# Patient Record
Sex: Female | Born: 1965
Health system: Southern US, Community
[De-identification: ages and names within clinical notes are randomized; demographics above are authoritative.]

## PROBLEM LIST (undated history)

## (undated) DIAGNOSIS — K219 Gastro-esophageal reflux disease without esophagitis: Secondary | ICD-10-CM

## (undated) DIAGNOSIS — Z853 Personal history of malignant neoplasm of breast: Secondary | ICD-10-CM

## (undated) DIAGNOSIS — Z9221 Personal history of antineoplastic chemotherapy: Secondary | ICD-10-CM

## (undated) DIAGNOSIS — Z972 Presence of dental prosthetic device (complete) (partial): Secondary | ICD-10-CM

## (undated) DIAGNOSIS — Z923 Personal history of irradiation: Secondary | ICD-10-CM

## (undated) HISTORY — PX: WISDOM TOOTH EXTRACTION: SHX21

---
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
## 2002-02-16 ENCOUNTER — Encounter: Payer: Self-pay | Admitting: Obstetrics and Gynecology

## 2002-02-16 ENCOUNTER — Ambulatory Visit (HOSPITAL_COMMUNITY): Admission: RE | Admit: 2002-02-16 | Discharge: 2002-02-16 | Payer: Self-pay | Admitting: Obstetrics and Gynecology

## 2003-08-06 ENCOUNTER — Other Ambulatory Visit: Admission: RE | Admit: 2003-08-06 | Discharge: 2003-08-06 | Payer: Self-pay | Admitting: Obstetrics and Gynecology

## 2012-06-03 ENCOUNTER — Ambulatory Visit (INDEPENDENT_AMBULATORY_CARE_PROVIDER_SITE_OTHER): Payer: 59 | Admitting: Obstetrics and Gynecology

## 2012-06-03 ENCOUNTER — Encounter: Payer: Self-pay | Admitting: Obstetrics and Gynecology

## 2012-06-03 VITALS — BP 100/60 | HR 70 | Wt 139.0 lb

## 2012-06-03 DIAGNOSIS — N912 Amenorrhea, unspecified: Secondary | ICD-10-CM

## 2012-06-03 NOTE — Progress Notes (Signed)
  45 YO complains of not having a period. Had same last October-December.  Once she started progesterone she had regular periods until April.  Spotted in April  (had just began Synthroid at the end of February) and no bleeding since.  Was told by Dr. Kevan Ny that she is peri-menopausal.  Admits to urinary frequency but denies dysuria, pelvic pain, vaginitis symptoms, vasomotor symptoms (since last fall) or vaginal dryness.  This past Sunday, had some spotting throughout the day with lower back discomfort but none since.  O: Abdomen: soft, non-tender     Pelvic: EGBUS-wnl, vagina-normal rugae, scant blood, cervix-string visible, uterus-normal size, adnexae-no masses or tenderness   UPT-negative U/A-negative  A: Oligomenorrhea/Perimenopausal     Newly Diagnosed Thyroid Disease  P:  Monitor bleeding       RTO-AEx 10/2011 or prn  Tonya Ambrosino, PA-C

## 2014-08-23 ENCOUNTER — Encounter: Payer: Self-pay | Admitting: Obstetrics and Gynecology

## 2016-10-22 DIAGNOSIS — Z853 Personal history of malignant neoplasm of breast: Secondary | ICD-10-CM

## 2016-10-22 HISTORY — DX: Personal history of malignant neoplasm of breast: Z85.3

## 2017-08-29 ENCOUNTER — Other Ambulatory Visit: Payer: Self-pay | Admitting: Radiology

## 2017-09-02 ENCOUNTER — Encounter: Payer: Self-pay | Admitting: Hematology and Oncology

## 2017-09-02 ENCOUNTER — Telehealth: Payer: Self-pay | Admitting: Hematology and Oncology

## 2017-09-02 ENCOUNTER — Encounter: Payer: Self-pay | Admitting: Genetics

## 2017-09-02 ENCOUNTER — Encounter: Payer: Self-pay | Admitting: *Deleted

## 2017-09-02 NOTE — Telephone Encounter (Signed)
Confirmed afternoon [facility] appointment on 09/04/17 with patient, [facility] patient so no packet sent to patient

## 2017-09-03 ENCOUNTER — Other Ambulatory Visit: Payer: Self-pay | Admitting: *Deleted

## 2017-09-03 DIAGNOSIS — Z170 Estrogen receptor positive status [ER+]: Principal | ICD-10-CM

## 2017-09-03 DIAGNOSIS — C50412 Malignant neoplasm of upper-outer quadrant of left female breast: Secondary | ICD-10-CM | POA: Insufficient documentation

## 2017-09-04 ENCOUNTER — Telehealth: Payer: Self-pay | Admitting: *Deleted

## 2017-09-04 ENCOUNTER — Ambulatory Visit (HOSPITAL_BASED_OUTPATIENT_CLINIC_OR_DEPARTMENT_OTHER): Payer: 59 | Admitting: Hematology and Oncology

## 2017-09-04 ENCOUNTER — Ambulatory Visit
Admission: RE | Admit: 2017-09-04 | Discharge: 2017-09-04 | Disposition: A | Discharge status: Home / Self Care | Payer: 59 | Source: Ambulatory Visit | Attending: Radiation Oncology | Admitting: Radiation Oncology

## 2017-09-04 ENCOUNTER — Other Ambulatory Visit: Payer: Self-pay | Admitting: *Deleted

## 2017-09-04 ENCOUNTER — Encounter: Payer: Self-pay | Admitting: Physical Therapy

## 2017-09-04 ENCOUNTER — Other Ambulatory Visit (HOSPITAL_BASED_OUTPATIENT_CLINIC_OR_DEPARTMENT_OTHER): Payer: 59

## 2017-09-04 ENCOUNTER — Encounter: Payer: Self-pay | Admitting: Hematology and Oncology

## 2017-09-04 ENCOUNTER — Ambulatory Visit: Payer: 59 | Attending: General Surgery | Admitting: Physical Therapy

## 2017-09-04 DIAGNOSIS — C50412 Malignant neoplasm of upper-outer quadrant of left female breast: Secondary | ICD-10-CM

## 2017-09-04 DIAGNOSIS — C773 Secondary and unspecified malignant neoplasm of axilla and upper limb lymph nodes: Secondary | ICD-10-CM

## 2017-09-04 DIAGNOSIS — Z170 Estrogen receptor positive status [ER+]: Principal | ICD-10-CM

## 2017-09-04 DIAGNOSIS — R293 Abnormal posture: Secondary | ICD-10-CM

## 2017-09-04 DIAGNOSIS — C50212 Malignant neoplasm of upper-inner quadrant of left female breast: Secondary | ICD-10-CM

## 2017-09-04 LAB — CBC WITH DIFFERENTIAL/PLATELET
BASO%: 0.5 % (ref 0.0–2.0)
BASOS ABS: 0 10*3/uL (ref 0.0–0.1)
EOS%: 0.4 % (ref 0.0–7.0)
Eosinophils Absolute: 0 10*3/uL (ref 0.0–0.5)
HEMATOCRIT: 40.4 % (ref 34.8–46.6)
HGB: 13.5 g/dL (ref 11.6–15.9)
LYMPH#: 1.3 10*3/uL (ref 0.9–3.3)
LYMPH%: 25.1 % (ref 14.0–49.7)
MCH: 29.3 pg (ref 25.1–34.0)
MCHC: 33.4 g/dL (ref 31.5–36.0)
MCV: 87.8 fL (ref 79.5–101.0)
MONO#: 0.3 10*3/uL (ref 0.1–0.9)
MONO%: 6.3 % (ref 0.0–14.0)
NEUT#: 3.5 10*3/uL (ref 1.5–6.5)
NEUT%: 67.7 % (ref 38.4–76.8)
PLATELETS: 205 10*3/uL (ref 145–400)
RBC: 4.61 10*6/uL (ref 3.70–5.45)
RDW: 13.9 % (ref 11.2–14.5)
WBC: 5.2 10*3/uL (ref 3.9–10.3)

## 2017-09-04 LAB — COMPREHENSIVE METABOLIC PANEL
ALBUMIN: 4.4 g/dL (ref 3.5–5.0)
ALK PHOS: 61 U/L (ref 40–150)
ALT: 11 U/L (ref 0–55)
ANION GAP: 9 meq/L (ref 3–11)
AST: 16 U/L (ref 5–34)
BUN: 10.4 mg/dL (ref 7.0–26.0)
CALCIUM: 9.5 mg/dL (ref 8.4–10.4)
CHLORIDE: 104 meq/L (ref 98–109)
CO2: 28 mEq/L (ref 22–29)
CREATININE: 0.8 mg/dL (ref 0.6–1.1)
EGFR: >60 mL/min/{1.73_m2} (ref >60)
Glucose: 89 mg/dl (ref 70–140)
POTASSIUM: 3.7 meq/L (ref 3.5–5.1)
Sodium: 141 mEq/L (ref 136–145)
Total Bilirubin: 0.71 mg/dL (ref 0.20–1.20)
Total Protein: 7.8 g/dL (ref 6.4–8.3)

## 2017-09-04 NOTE — Assessment & Plan Note (Signed)
08/29/2017: Screening detected left breast mass 4 cm at 12 o'clock position, 1.4 cm lymph node the axilla, biopsy of the mass and the lymph nodes were positive for IDC with DCIS grade 3 ER 70% PR 0%, KI 2%, HER-2 negative ratio 1.77, T2N1 stage IIIa clinical stage AJCC 8  Pathology and radiology counseling: Discussed with the patient, the details of pathology including the type of breast cancer,the clinical staging, the significance of ER, PR and HER-2/neu receptors and the implications for treatment. After reviewing the pathology in detail, we proceeded to discuss the different treatment options between surgery, radiation, chemotherapy, antiestrogen therapies.  Recommendation: 1.  Mammaprint testing on the biopsy to determine which neoadjuvant chemotherapy would be better 2. neoadjuvant chemotherapy with dose dense Adriamycin and Cytoxan followed by Taxol x12 versus 6 months of antiestrogen therapy 3.  Followed by breast conserving surgery and targeted lymph node dissection 4.  Followed by radiation 5.  Follow-up antiestrogen therapy  Breast MRI will be obtained along with CT chest abdomen pelvis and bone scan. Patient will return back to see me after the Mammaprint and the CT scans were performed.

## 2017-09-04 NOTE — Progress Notes (Signed)
Radiation Oncology         [phone] ________________________________  Initial Outpatient Consultation  Name: Tonya Jackson MRN: [id number]  Date: 09/04/2017  DOB: [birth year]  YO:VZCHY, Butch Penny, MD  Jovita Kussmaul, MD   REFERRING PHYSICIAN: Autumn Messing III, MD  DIAGNOSIS:    ICD-10-CM   1. Malignant neoplasm of upper-outer quadrant of left breast in female, estrogen receptor positive ([place]) C50.412    Z17.0   Cancer Staging Malignant neoplasm of upper-outer quadrant of left breast in female, estrogen receptor positive ([place]) Staging form: Breast, AJCC 8th Edition - Clinical stage from 09/04/2017: Stage IIIA (cT2, cN1, cM0, G3, ER: Positive, PR: Negative, HER2: Negative) - Unsigned   Stage IIIA cT2N1M0 Left Breast UOQ Invasive Ductal Carcinoma, ER(+) / PR(-) / Her2(-), Grade 3  CHIEF COMPLAINT: Here to discuss management of left breast cancer  HISTORY OF PRESENT ILLNESS::Syndi I Jackson is a 51 y.o. female who presented with a left breast mass on screening mammography. On ultrasound this measured 4.0 cm at the 12:00 position. There was a 1.4 cm lymph node also appreciated on ultrasound in the axilla. Biopsy showed invasive ductal carcinoma with DCIS, ER 70%, PR negative, Her2 negative, grade 3 (Pathology is going to review the grade since Ki-67 was 2%). The lymph node was biopsied and found to be positive.  The patient presents to our [facility] today for evaluation and discussion of potential treatment options. On review of systems, she is positive for breast lump and easy bruising.  GYN History: The patient had her first menstrual period at age 25. She is postmenopausal.  Obstretric History: She has carried two children to term, with the first live birth at age 72.  PREVIOUS RADIATION THERAPY: No  PAST MEDICAL HISTORY:  has a past medical history of Abnormal Pap smear (1998), Anemia (2003), Dysuria (2011), H/O cyst of breast (2010), H/O diarrhea  (2009), H/O dysmenorrhea (2003), H/O hematuria (2011), H/O varicella, H/O vulvovaginitis (2010), H/O: menorrhagia (2003), History of measles, mumps, or rubella, UTI (urinary tract infection) (2008), Menses, irregular (2003), Monilial vaginitis (2003), Oligomenorrhea (11/05/2011), Perimenopausal symptoms (11/05/2011), Vulvar inflammation (2003), and Yeast infection.    PAST SURGICAL HISTORY: Past Surgical History:  Procedure Laterality Date  . APPENDECTOMY  1982   An open   . WISDOM TOOTH EXTRACTION      FAMILY HISTORY: family history includes Diabetes in her father; Hypertension in her father and other.  SOCIAL HISTORY:  reports that  has never smoked. she has never used smokeless tobacco. She reports that she does not drink alcohol or use drugs.  ALLERGIES: Patient has no known allergies.  MEDICATIONS:  Current Outpatient Medications  Medication Sig Dispense Refill  . levothyroxine (SYNTHROID, LEVOTHROID) 50 MCG tablet Take 50 mcg by mouth daily.    Marland Kitchen PROGESTERONE MICRONIZED PO Take 200 mg/day by mouth.     No current facility-administered medications for this encounter.     REVIEW OF SYSTEMS: A 10+ POINT REVIEW OF SYSTEMS WAS OBTAINED including neurology, dermatology, psychiatry, cardiac, respiratory, lymph, extremities, GI, GU, Musculoskeletal, constitutional, breasts, reproductive, HEENT.  All pertinent positives are noted in the HPI.  All others are negative.   PHYSICAL EXAM:  Vitals with BMI 09/04/2017  Height '5\' 6"'$   Weight 139 lbs 13 oz  BMI 85.02  Systolic 774  Diastolic 82  Pulse 78  Respirations 20   General: Alert and oriented, in no acute distress HEENT: Head is normocephalic. Extraocular movements are intact. Oropharynx is clear. Neck: Neck  is supple, no palpable cervical or supraclavicular lymphadenopathy. Heart: Regular in rate and rhythm with no murmurs, rubs, or gallops. Chest: Clear to auscultation bilaterally, with no rhonchi, wheezes, or rales. Abdomen:  Soft, nontender, nondistended, with no rigidity or guarding. Extremities: No cyanosis or edema. Lymphatics: see Neck Exam Skin: No concerning lesions. Musculoskeletal: symmetric strength and muscle tone throughout. Neurologic: Cranial nerves II through XII are grossly intact. No obvious focalities. Speech is fluent. Coordination is intact. Psychiatric: Judgment and insight are intact. Affect is appropriate. Breasts: Mobile area of thickening in the 12:00 region of the right breast which was about 2 cm, this could be benign but will be worked up further on her breast MRI. Left breast bruising with post-biopsy mass at 1:00 measuring 5 cm. No palpable axillary nodes bilaterally.   ECOG = 0  0 - Asymptomatic (Fully active, able to carry on all predisease activities without restriction)  1 - Symptomatic but completely ambulatory (Restricted in physically strenuous activity but ambulatory and able to carry out work of a light or sedentary nature. For example, light housework, office work)  2 - Symptomatic, <50% in bed during the day (Ambulatory and capable of all self care but unable to carry out any work activities. Up and about more than 50% of waking hours)  3 - Symptomatic, >50% in bed, but not bedbound (Capable of only limited self-care, confined to bed or chair 50% or more of waking hours)  4 - Bedbound (Completely disabled. Cannot carry on any self-care. Totally confined to bed or chair)  5 - Death   Eustace Pen MM, Creech RH, Tormey DC, et al. [phone]). "Toxicity and response criteria of the [facility]". [place] Oncol. 5 (6): 649-55   LABORATORY DATA:  Lab Results  Component Value Date   WBC 5.2 09/04/2017   HGB 13.5 09/04/2017   HCT 40.4 09/04/2017   MCV 87.8 09/04/2017   PLT 205 09/04/2017   CMP     Component Value Date/Time   NA 141 09/04/2017 1207   K 3.7 09/04/2017 1207   CO2 28 09/04/2017 1207   GLUCOSE 89 09/04/2017 1207   BUN 10.4 09/04/2017  1207   CREATININE 0.8 09/04/2017 1207   CALCIUM 9.5 09/04/2017 1207   PROT 7.8 09/04/2017 1207   ALBUMIN 4.4 09/04/2017 1207   AST 16 09/04/2017 1207   ALT 11 09/04/2017 1207   ALKPHOS 61 09/04/2017 1207   BILITOT 0.71 09/04/2017 1207         RADIOGRAPHY: as above    IMPRESSION/PLAN: Left Breast Cancer   The patient will undergo MRI of the bilateral breasts (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 Post-operatively, she will ultimately need radiotherapy to the left breast and regional nodes.  It was a pleasure meeting the patient today. We discussed the risks, benefits, and side effects of radiotherapy. I recommend radiotherapy to the left breast and regional nodes to reduce her risk of locoregional recurrence by 2/3.  We discussed that radiation would take approximately 6 weeks to complete and that I would give the patient a few weeks to heal following surgery before starting treatment planning.  Chemotherapy will possibly be given, this would precede radiotherapy. We spoke about acute effects including skin irritation and fatigue as well as much less common late effects including internal organ injury or irritation. We spoke about the latest technology that is used to minimize the  risk of late effects for patients undergoing radiotherapy to the breast or chest wall. No guarantees of treatment were given. The patient is enthusiastic about proceeding with treatment. I look forward to participating in the patient's care.  I will await her referral back to me for postoperative follow-up and eventual CT simulation/treatment planning.    __________________________________________   Eppie Gibson, MD  This document serves as a record of services personally performed by Eppie Gibson, MD. It was created on her behalf by Rae Lips, a trained medical scribe. The creation of this record  is based on the scribe's personal observations and the provider's statements to them. This document has been checked and approved by the attending provider.

## 2017-09-04 NOTE — Therapy (Signed)
[place], [place], [address] Phone: [phone]   Fax:  [phone]  Physical Therapy Evaluation  Patient Details  Name: Tonya Jackson MRN: [id number] Date of Birth: 1966-08-07 Referring Provider: Dr. Autumn Messing   Encounter Date: 09/04/2017  PT End of Session - 09/04/17 1949    Visit Number  1    Number of Visits  1    PT Start Time  9935    PT Stop Time  1411    PT Time Calculation (min)  23 min    Activity Tolerance  Patient tolerated treatment well    Behavior During Therapy  [facility] for tasks assessed/performed       Past Medical History:  Diagnosis Date  . Abnormal Pap smear 1998  . Anemia 2003  . Dysuria 2011  . H/O cyst of breast 2010   Right  . H/O diarrhea 2009  . H/O dysmenorrhea 2003  . H/O hematuria 2011  . H/O varicella   . H/O vulvovaginitis 2010  . H/O: menorrhagia 2003  . History of measles, mumps, or rubella   . Hx: UTI (urinary tract infection) 2008  . Menses, irregular 2003  . Monilial vaginitis 2003  . Oligomenorrhea 11/05/2011  . Perimenopausal symptoms 11/05/2011  . Vulvar inflammation 2003  . Yeast infection     Past Surgical History:  Procedure Laterality Date  . APPENDECTOMY  1982   An open   . WISDOM TOOTH EXTRACTION      There were no vitals filed for this visit.   Subjective Assessment - 09/04/17 1942    Subjective  Patient reports she is here today to meet with her medical team for her newly diagnosed left breast cancer.    Patient is accompained by:  Family member    Pertinent History  Patient was diagnosed on 08/23/17 with left grade 3 invasive ductal carcinoma breast cancer with DCIS. It measures 4 cm and is located in the upper outer quadrant. There is also a positive axillary lymph node. It is ER positive, PR negative, and HER2 negative with a Ki67 of 2%.    Patient Stated Goals  Reduce lymphedema risk and learn post op shoulder ROM HEP    Currently in Pain?  No/denies         [facility] PT Assessment - 09/04/17 0001      Assessment   Medical Diagnosis  Left breast cancer    Referring Provider  Dr. Autumn Messing    Onset Date/Surgical Date  08/23/17    Hand Dominance  Right    Prior Therapy  none      Precautions   Precautions  Other (comment)    Precaution Comments  active cancer      Restrictions   Weight Bearing Restrictions  No      Balance Screen   Has the patient fallen in the past 6 months  No    Has the patient had a decrease in activity level because of a fear of falling?   No    Is the patient reluctant to leave their home because of a fear of falling?   No      Home Environment   Living Environment  Private residence    Living Arrangements  Children;Spouse/significant other Husband and 71 y.o. son    Available Help at Discharge  Family      Prior Function   Level of [place]  Full time  employment    Vocation Requirements  Does alterations at [employer]  She does not exercise      Cognition   Overall Cognitive Status  Within Functional Limits for tasks assessed      Posture/Postural Control   Posture/Postural Control  Postural limitations    Postural Limitations  Rounded Shoulders;Forward head      ROM / Strength   AROM / PROM / Strength  AROM;Strength      AROM   AROM Assessment Site  Shoulder;Cervical    Right/Left Shoulder  Right;Left    Right Shoulder Extension  57 Degrees    Right Shoulder Flexion  145 Degrees    Right Shoulder ABduction  160 Degrees    Right Shoulder Internal Rotation  69 Degrees    Right Shoulder External Rotation  77 Degrees    Left Shoulder Extension  55 Degrees    Left Shoulder Flexion  138 Degrees    Left Shoulder ABduction  152 Degrees    Left Shoulder Internal Rotation  79 Degrees    Left Shoulder External Rotation  79 Degrees    Cervical Flexion  WNL    Cervical Extension  WNL    Cervical - Right Side Bend  WNL    Cervical  - Left Side Bend  WNL    Cervical - Right Rotation  WNL    Cervical - Left Rotation  WNL      Strength   Overall Strength  Within functional limits for tasks performed        LYMPHEDEMA/ONCOLOGY QUESTIONNAIRE - 09/04/17 1948      Type   Cancer Type  Left breast cancer      Lymphedema Assessments   Lymphedema Assessments  Upper extremities      Right Upper Extremity Lymphedema   10 cm Proximal to Olecranon Process  25.8 cm    Olecranon Process  24.5 cm    10 cm Proximal to Ulnar Styloid Process  22.6 cm    Just Proximal to Ulnar Styloid Process  16.1 cm    Across Hand at [employer]  19.7 cm    At [place] of 2nd Digit  6.4 cm      Left Upper Extremity Lymphedema   10 cm Proximal to Olecranon Process  25 cm    Olecranon Process  24.3 cm    10 cm Proximal to Ulnar Styloid Process  21.9 cm    Just Proximal to Ulnar Styloid Process  16.3 cm    Across Hand at [employer]  19.1 cm    At [place] of 2nd Digit  6.5 cm          Objective measurements completed on examination: See above findings.     Patient was instructed today in a home exercise program today for post op shoulder range of motion. These included active assist shoulder flexion in sitting, scapular retraction, wall walking with shoulder abduction, and hands behind head external rotation.  She was encouraged to do these twice a day, holding 3 seconds and repeating 5 times when permitted by her physician.     PT Education - 09/04/17 1949    Education provided  Yes    Education Details  Lymphedema risk reduction and post op shoulder ROM HEP    Person(s) Educated  Patient;Spouse    Methods  Explanation;Demonstration;Handout    Comprehension  Returned demonstration;Verbalized understanding           Breast Clinic Goals -  09/04/17 1954      Patient will be able to verbalize understanding of pertinent lymphedema risk reduction practices relevant to her diagnosis specifically related to skin care.   Time   1    Period  Days    Status  Achieved      Patient will be able to return demonstrate and/or verbalize understanding of the post-op home exercise program related to regaining shoulder range of motion.   Time  1    Period  Days    Status  Achieved      Patient will be able to verbalize understanding of the importance of attending the postoperative After Breast Cancer Class for further lymphedema risk reduction education and therapeutic exercise.   Time  1    Period  Days    Status  Achieved            Plan - 09/04/17 1950    Clinical Impression Statement  Patient was diagnosed on 08/23/17 with left grade 3 invasive ductal carcinoma breast cancer with DCIS. It measures 4 cm and is located in the upper outer quadrant. There is also a positive axillary lymph node. It is ER positive, PR negative, and HER2 negative with a Ki67 of 2%. Her multidisciplinary medicla team met prior to her assessments to determine a recommended treatment plan. She is planning to have a mammaprint test to if she will need neoadjuvant chemotherapy or hormone therapy. She will then have a left lumpectomy and targeted axillary node dissection followed by radiation and anti-estrogen therapy. She will benefit from a post op PT visit to determine PT needs.    History and Personal Factors relevant to plan of care:  None    Clinical Presentation  Stable    Clinical Decision Making  Low    Rehab Potential  Excellent    Clinical Impairments Affecting Rehab Potential  None    PT Frequency  -- Eval and 1 f/u visit     PT Treatment/Interventions  ADLs/Self Care Home Management;Therapeutic exercise;Patient/family education    PT Next Visit Plan  Will reassess pt 3-4 weeks post op to determine PT needs    PT Home Exercise Plan  Post op shoulder ROM HEP    Consulted and Agree with Plan of Care  Patient;Family member/caregiver    Family Member Consulted  Husband       Patient will benefit from skilled therapeutic intervention  in order to improve the following deficits and impairments:  Impaired UE functional use, Decreased knowledge of precautions, Decreased range of motion, Postural dysfunction, Pain  Visit Diagnosis: Malignant neoplasm of upper-outer quadrant of left breast in female, estrogen receptor positive ([place]) - Plan: PT plan of care cert/re-cert  Abnormal posture - Plan: PT plan of care cert/re-cert   Patient will follow up at outpatient cancer rehab 3-4 weeks following surgery.  If the patient requires physical therapy at that time, a specific plan will be dictated and sent to the referring physician for approval. The patient was educated today on appropriate basic range of motion exercises to begin post operatively and the importance of attending the After Breast Cancer class following surgery.  Patient was educated today on lymphedema risk reduction practices as it pertains to recommendations that will benefit the patient immediately following surgery.  She verbalized good understanding.      Problem List Patient Active Problem List   Diagnosis Date Noted  . Malignant neoplasm of upper-outer quadrant of left breast in female, estrogen receptor positive ([place])  09/03/2017   Annia Friendly, PT 09/04/17 7:58 PM  [place], [place], [address] Phone: [phone]   Fax:  [phone]  Name: Tonya Jackson MRN: [id number] Date of Birth: [birth year]

## 2017-09-04 NOTE — Telephone Encounter (Signed)
Ordered mammaprint on core bx per Dr. Lindi Adie.  Faxed requisition to [facility] and to [facility].

## 2017-09-04 NOTE — Progress Notes (Signed)
[place] NOTE  Patient Care Team: [name], MD as PCP - General (Family Medicine)  CHIEF COMPLAINTS/PURPOSE OF CONSULTATION:  Newly diagnosed breast cancer  HISTORY OF PRESENTING ILLNESS:  Tonya Jackson 51 y.o. female is here because of recent diagnosis of left breast cancer.  Patient had a screening mammogram that detected a left breast mass.  Few weeks before then patient had felt a lump in the left breast.  It was 4 cm in size at 12 o'clock position.  There was also 1.4 cm left axillary lymph node that was seen on the ultrasound and a biopsy of that was performed as well.  Breast biopsy revealed invasive ductal carcinoma with DCIS that was grade 3 ER 70% PR 0% Ki-67 2% and HER-2 negative.  She was presented this morning of the multidisciplinary tumor board and she is here today in the [facility] clinic to discuss her treatment plan.  I reviewed her records extensively and collaborated the history with the patient.  SUMMARY OF ONCOLOGIC HISTORY:   Malignant neoplasm of upper-outer quadrant of left breast in female, estrogen receptor positive ([place])   08/29/2017 Initial Diagnosis    Screening detected left breast mass 4 cm at 12 o'clock position, 1.4 cm lymph node the axilla, biopsy of the mass and the lymph nodes were positive for IDC with DCIS grade 3 ER 70% PR 0%, KI 2%, HER-2 negative ratio 1.77, T2N1 stage IIIa clinical stage AJCC 8       MEDICAL HISTORY:  Past Medical History:  Diagnosis Date  . Abnormal Pap smear 1998  . Anemia 2003  . Dysuria 2011  . H/O cyst of breast 2010   Right  . H/O diarrhea 2009  . H/O dysmenorrhea 2003  . H/O hematuria 2011  . H/O varicella   . H/O vulvovaginitis 2010  . H/O: menorrhagia 2003  . History of measles, mumps, or rubella   . Hx: UTI (urinary tract infection) 2008  . Menses, irregular 2003  . Monilial vaginitis 2003  . Oligomenorrhea 11/05/2011  . Perimenopausal symptoms 11/05/2011  . Vulvar inflammation  2003  . Yeast infection     SURGICAL HISTORY: Past Surgical History:  Procedure Laterality Date  . APPENDECTOMY  1982   An open   . WISDOM TOOTH EXTRACTION      SOCIAL HISTORY: Social History   Socioeconomic History  . Marital status: Married    Spouse name: Not on file  . Number of children: Not on file  . Years of education: Not on file  . Highest education level: Not on file  Social Needs  . Financial resource strain: Not on file  . Food insecurity - worry: Not on file  . Food insecurity - inability: Not on file  . Transportation needs - medical: Not on file  . Transportation needs - non-medical: Not on file  Occupational History  . Not on file  Tobacco Use  . Smoking status: Never Smoker  . Smokeless tobacco: Never Used  Substance and Sexual Activity  . Alcohol use: No  . Drug use: No  . Sexual activity: Yes    Birth control/protection: IUD    Comment: PARAGARD  Other Topics Concern  . Not on file  Social History Narrative  . Not on file    FAMILY HISTORY: Family History  Problem Relation Age of Onset  . Diabetes Father   . Hypertension Father   . Hypertension Other     ALLERGIES:  has No Known  Allergies.  MEDICATIONS:  Current Outpatient Medications  Medication Sig Dispense Refill  . levothyroxine (SYNTHROID, LEVOTHROID) 50 MCG tablet Take 50 mcg by mouth daily.    Marland Kitchen PROGESTERONE MICRONIZED PO Take 200 mg/day by mouth.     No current facility-administered medications for this visit.     REVIEW OF SYSTEMS:   Constitutional: Denies fevers, chills or abnormal night sweats Eyes: Denies blurriness of vision, double vision or watery eyes Ears, nose, mouth, throat, and face: Denies mucositis or sore throat Respiratory: Denies cough, dyspnea or wheezes Cardiovascular: Denies palpitation, chest discomfort or lower extremity swelling Gastrointestinal:  Denies nausea, heartburn or change in bowel habits Skin: Denies abnormal skin rashes Lymphatics:  Denies new lymphadenopathy or easy bruising Neurological:Denies numbness, tingling or new weaknesses Behavioral/Psych: Mood is stable, no new changes  Breast: Palpable lump in the left breast All other systems were reviewed with the patient and are negative.  PHYSICAL EXAMINATION: ECOG PERFORMANCE STATUS: 1 - Symptomatic but completely ambulatory  Vitals:   09/04/17 1241  BP: 139/82  Pulse: 78  Resp: 20  Temp: 98.4 F (36.9 C)  SpO2: 99%   Filed Weights   09/04/17 1241  Weight: 139 lb 12.8 oz (63.4 kg)    GENERAL:alert, no distress and comfortable SKIN: skin color, texture, turgor are normal, no rashes or significant lesions EYES: normal, conjunctiva are pink and non-injected, sclera clear OROPHARYNX:no exudate, no erythema and lips, buccal mucosa, and tongue normal  NECK: supple, thyroid normal size, non-tender, without nodularity LYMPH:  no palpable lymphadenopathy in the cervical, axillary or inguinal LUNGS: clear to auscultation and percussion with normal breathing effort HEART: regular rate & rhythm and no murmurs and no lower extremity edema ABDOMEN:abdomen soft, non-tender and normal bowel sounds Musculoskeletal:no cyanosis of digits and no clubbing  PSYCH: alert & oriented x 3 with fluent speech NEURO: no focal motor/sensory deficits BREAST: Palpable lump in the left breast along with palpable axillary lymph node. No palpable axillary or supraclavicular lymphadenopathy (exam performed in the presence of a chaperone)   LABORATORY DATA:  I have reviewed the data as listed Lab Results  Component Value Date   WBC 5.2 09/04/2017   HGB 13.5 09/04/2017   HCT 40.4 09/04/2017   MCV 87.8 09/04/2017   PLT 205 09/04/2017   Lab Results  Component Value Date   NA 141 09/04/2017   K 3.7 09/04/2017   CO2 28 09/04/2017    RADIOGRAPHIC STUDIES: I have personally reviewed the radiological reports and agreed with the findings in the report.  ASSESSMENT AND PLAN:   Malignant neoplasm of upper-outer quadrant of left breast in female, estrogen receptor positive ([place]) 08/29/2017: Screening detected left breast mass 4 cm at 12 o'clock position, 1.4 cm lymph node the axilla, biopsy of the mass and the lymph nodes were positive for IDC with DCIS grade 3 ER 70% PR 0%, KI 2%, HER-2 negative ratio 1.77, T2N1 stage IIIa clinical stage AJCC 8  Pathology and radiology counseling: Discussed with the patient, the details of pathology including the type of breast cancer,the clinical staging, the significance of ER, PR and HER-2/neu receptors and the implications for treatment. After reviewing the pathology in detail, we proceeded to discuss the different treatment options between surgery, radiation, chemotherapy, antiestrogen therapies.  Recommendation: 1.  Mammaprint testing on the biopsy to determine which neoadjuvant chemotherapy would be better 2. neoadjuvant chemotherapy with dose dense Adriamycin and Cytoxan followed by Taxol x12 versus 6 months of antiestrogen therapy 3.  Followed by breast  conserving surgery and targeted lymph node dissection 4.  Followed by radiation 5.  Follow-up antiestrogen therapy  Breast MRI will be obtained along with CT chest abdomen pelvis and bone scan. Patient will return back to see me after the Mammaprint and the CT scans were performed.   All questions were answered. The patient knows to call the clinic with any problems, questions or concerns.    Rulon Eisenmenger, MD 09/04/17

## 2017-09-04 NOTE — Patient Instructions (Signed)

## 2017-09-06 ENCOUNTER — Encounter: Payer: Self-pay | Admitting: Radiation Oncology

## 2017-09-09 ENCOUNTER — Telehealth: Payer: Self-pay | Admitting: *Deleted

## 2017-09-09 NOTE — Telephone Encounter (Signed)
Spoke to pt concerning [place] from 09/04/17. Denies questions or concerns at this time. Encourage pt to call with needs. Received verbal understanding. Contact information provided.

## 2017-09-11 ENCOUNTER — Ambulatory Visit (HOSPITAL_COMMUNITY)
Admission: RE | Admit: 2017-09-11 | Discharge: 2017-09-11 | Disposition: A | Discharge status: Home / Self Care | Payer: 59 | Source: Ambulatory Visit | Attending: Hematology and Oncology | Admitting: Hematology and Oncology

## 2017-09-11 ENCOUNTER — Encounter: Payer: Self-pay | Admitting: General Practice

## 2017-09-11 DIAGNOSIS — C50212 Malignant neoplasm of upper-inner quadrant of left female breast: Secondary | ICD-10-CM | POA: Diagnosis present

## 2017-09-11 DIAGNOSIS — Z170 Estrogen receptor positive status [ER+]: Secondary | ICD-10-CM | POA: Diagnosis not present

## 2017-09-11 DIAGNOSIS — R590 Localized enlarged lymph nodes: Secondary | ICD-10-CM | POA: Insufficient documentation

## 2017-09-11 MED ORDER — GADOBENATE DIMEGLUMINE 529 MG/ML IV SOLN
15.0000 mL | Freq: Once | INTRAVENOUS | Status: AC | PRN
  Administered 2017-09-11: 13 mL via INTRAVENOUS

## 2017-09-11 NOTE — Progress Notes (Signed)
[place] Psychosocial Distress Screening [place] by phone following Breast Multidisciplinary Clinic to introduce [place] team/resources, reviewing distress screen per protocol.  The patient scored a 6 on the Psychosocial Distress Thermometer which indicates moderate distress. Also assessed for distress and other psychosocial needs.   ONCBCN DISTRESS SCREENING 09/11/2017  Screening Type Initial Screening  Distress experienced in past week (1-10) 6  Practical problem type Insurance;Work/school  Emotional problem type Nervousness/Anxiety  Information Concerns Type Lack of info about diagnosis;Lack of info about treatment;Lack of info about complementary therapy choices  Referral to support programs Yes   Tonya Jackson eagerly awaits results of her scans because is "hopeful for good news" and wants to take action toward treatment right away. Normalized feelings, encouraging utilization of Support Center team/programming resources.  Follow up needed: No. Per pt, no other needs at this time, but she knows to contact Team whenever desired.   Tonya Jackson, [place], [facility] Pager [phone] Voicemail [phone]

## 2017-09-17 ENCOUNTER — Ambulatory Visit (HOSPITAL_COMMUNITY)
Admission: RE | Admit: 2017-09-17 | Discharge: 2017-09-17 | Disposition: A | Discharge status: Home / Self Care | Payer: 59 | Source: Ambulatory Visit | Attending: Hematology and Oncology | Admitting: Hematology and Oncology

## 2017-09-17 ENCOUNTER — Encounter (HOSPITAL_COMMUNITY)
Admission: RE | Admit: 2017-09-17 | Discharge: 2017-09-17 | Disposition: A | Discharge status: Home / Self Care | Payer: 59 | Source: Ambulatory Visit | Attending: Hematology and Oncology | Admitting: Hematology and Oncology

## 2017-09-17 ENCOUNTER — Other Ambulatory Visit: Payer: Self-pay

## 2017-09-17 ENCOUNTER — Encounter (HOSPITAL_COMMUNITY): Payer: Self-pay | Admitting: Radiology

## 2017-09-17 DIAGNOSIS — Z170 Estrogen receptor positive status [ER+]: Principal | ICD-10-CM

## 2017-09-17 DIAGNOSIS — C50212 Malignant neoplasm of upper-inner quadrant of left female breast: Secondary | ICD-10-CM

## 2017-09-17 DIAGNOSIS — R911 Solitary pulmonary nodule: Secondary | ICD-10-CM | POA: Insufficient documentation

## 2017-09-17 DIAGNOSIS — C50412 Malignant neoplasm of upper-outer quadrant of left female breast: Secondary | ICD-10-CM

## 2017-09-17 MED ORDER — TECHNETIUM TC 99M MEDRONATE IV KIT
21.2000 | PACK | Freq: Once | INTRAVENOUS | Status: AC | PRN
  Administered 2017-09-17: 21.2 via INTRAVENOUS

## 2017-09-17 MED ORDER — IOPAMIDOL (ISOVUE-300) INJECTION 61%
INTRAVENOUS | Status: AC
  Administered 2017-09-17: 100 mL via INTRAVENOUS
  Filled 2017-09-17: qty 100

## 2017-09-18 ENCOUNTER — Encounter (HOSPITAL_COMMUNITY): Payer: Self-pay

## 2017-09-18 ENCOUNTER — Telehealth: Payer: Self-pay

## 2017-09-18 ENCOUNTER — Telehealth: Payer: Self-pay | Admitting: Hematology and Oncology

## 2017-09-18 ENCOUNTER — Telehealth: Payer: Self-pay | Admitting: *Deleted

## 2017-09-18 NOTE — Telephone Encounter (Signed)
RN scheduled 11/29 appointment with patient as requested by MD to discuss Mammaprint results. Patient verbally verified appointment date, time, and location.

## 2017-09-18 NOTE — Telephone Encounter (Signed)
RN called to schedule appointment for 11/29, as instructed by Dr. Lindi Adie, to discuss mammaprint results. Message left on patient's voicemail requesting call back to schedule appointment.

## 2017-09-18 NOTE — Telephone Encounter (Signed)
I informed the patient that CT chest abdomen and pelvis did not show any evidence of metastatic disease.  There is still waiting for Mammaprint test results

## 2017-09-18 NOTE — Telephone Encounter (Signed)
Received Mammaprint result of High Risk. Physician team notified.

## 2017-09-19 ENCOUNTER — Telehealth: Payer: Self-pay | Admitting: Hematology and Oncology

## 2017-09-19 ENCOUNTER — Encounter: Payer: Self-pay | Admitting: *Deleted

## 2017-09-19 ENCOUNTER — Other Ambulatory Visit: Payer: Self-pay

## 2017-09-19 ENCOUNTER — Ambulatory Visit (HOSPITAL_BASED_OUTPATIENT_CLINIC_OR_DEPARTMENT_OTHER): Payer: 59 | Admitting: Hematology and Oncology

## 2017-09-19 VITALS — BP 117/67 | HR 83 | Temp 97.7°F | Resp 18 | Ht 66.0 in | Wt 137.9 lb

## 2017-09-19 DIAGNOSIS — C773 Secondary and unspecified malignant neoplasm of axilla and upper limb lymph nodes: Secondary | ICD-10-CM

## 2017-09-19 DIAGNOSIS — Z79899 Other long term (current) drug therapy: Secondary | ICD-10-CM

## 2017-09-19 DIAGNOSIS — Z170 Estrogen receptor positive status [ER+]: Secondary | ICD-10-CM | POA: Diagnosis not present

## 2017-09-19 DIAGNOSIS — C50412 Malignant neoplasm of upper-outer quadrant of left female breast: Secondary | ICD-10-CM | POA: Diagnosis not present

## 2017-09-19 DIAGNOSIS — Z5181 Encounter for therapeutic drug level monitoring: Secondary | ICD-10-CM

## 2017-09-19 MED ORDER — DEXAMETHASONE 4 MG PO TABS: 4.0000 mg | ORAL_TABLET | Freq: Every day | ORAL | 0 refills | Status: AC

## 2017-09-19 MED ORDER — LIDOCAINE-PRILOCAINE 2.5-2.5 % EX CREA: TOPICAL_CREAM | CUTANEOUS | 3 refills | Status: DC

## 2017-09-19 MED ORDER — PROCHLORPERAZINE MALEATE 10 MG PO TABS: 10.0000 mg | ORAL_TABLET | Freq: Four times a day (QID) | ORAL | 1 refills | Status: DC | PRN

## 2017-09-19 MED ORDER — ONDANSETRON HCL 8 MG PO TABS: 8.0000 mg | ORAL_TABLET | Freq: Two times a day (BID) | ORAL | 1 refills | Status: DC | PRN

## 2017-09-19 MED ORDER — LORAZEPAM 0.5 MG PO TABS: 0.5000 mg | ORAL_TABLET | Freq: Every evening | ORAL | 0 refills | Status: DC | PRN

## 2017-09-19 NOTE — Progress Notes (Signed)
START ON PATHWAY REGIMEN - Breast   Doxorubicin + Cyclophosphamide (AC):   A cycle is every 21 days:     Doxorubicin      Cyclophosphamide   **Always confirm dose/schedule in your pharmacy ordering system**    Paclitaxel 80 mg/m2 Weekly:   Administer weekly:     Paclitaxel   **Always confirm dose/schedule in your pharmacy ordering system**    Patient Characteristics: Preoperative or Nonsurgical Candidate (Clinical Staging), Neoadjuvant Therapy followed by Surgery, Invasive Disease, Chemotherapy, HER2 Negative/Unknown/Equivocal, ER Positive Therapeutic Status: Preoperative or Nonsurgical Candidate (Clinical Staging) AJCC M Category: cM0 AJCC Grade: G3 Breast Surgical Plan: Neoadjuvant Therapy followed by Surgery ER Status: Positive (+) AJCC 8 Stage Grouping: IIIA HER2 Status: Negative (-) AJCC T Category: cT3 AJCC N Category: cN0 PR Status: Negative (-) Intent of Therapy: Curative Intent, Discussed with Patient

## 2017-09-19 NOTE — Progress Notes (Signed)
Patient Care Team: [name], MD as PCP - General (Family Medicine)  DIAGNOSIS:  Encounter Diagnosis  Name Primary?  . Malignant neoplasm of upper-outer quadrant of left breast in female, estrogen receptor positive ([place])     SUMMARY OF ONCOLOGIC HISTORY:   Malignant neoplasm of upper-outer quadrant of left breast in female, estrogen receptor positive ([place])   08/29/2017 Initial Diagnosis    Screening detected left breast mass 4 cm at 12 o'clock position, 1.4 cm lymph node the axilla, biopsy of the mass and the lymph nodes were positive for IDC with DCIS grade 3 ER 70% PR 0%, KI 2%, HER-2 negative ratio 1.77, T2N1 stage IIIa clinical stage AJCC 8      09/11/2017 Breast MRI    Left breast malignancy with associated linear NME extending posteriorly as well as satellite nodules measures up to 5.3 cm, at least 4 abnormal left axillary lymph nodes      09/11/2017 Imaging    CT CAP and Bone scan negative for metastatic disease      09/18/2017 Miscellaneous    Mammaprint high risk luminal type B.  Average 10-year risk of recurrence untreated 29%; with chemotherapy and hormonal therapy distant metastasis free interval at 5 years 94.6%       CHIEF COMPLIANT: Follow-up to discuss Mammaprint test results  INTERVAL HISTORY: Tonya Jackson is a 51 year old with above-mentioned history of left breast cancer who is here to discuss the results of the recently obtained Mammaprint test results.  She also had a breast MRI as well as CT chest abdomen pelvis.  The patient is today accompanied by her husband and her mother-in-law.  REVIEW OF SYSTEMS:   Constitutional: Denies fevers, chills or abnormal weight loss Eyes: Denies blurriness of vision Ears, nose, mouth, throat, and face: Denies mucositis or sore throat Respiratory: Denies cough, dyspnea or wheezes Cardiovascular: Denies palpitation, chest discomfort Gastrointestinal:  Denies nausea, heartburn or change in bowel habits Skin:  Denies abnormal skin rashes Lymphatics: Denies new lymphadenopathy or easy bruising Neurological:Denies numbness, tingling or new weaknesses Behavioral/Psych: Mood is stable, no new changes  Extremities: No lower extremity edema  All other systems were reviewed with the patient and are negative.  I have reviewed the past medical history, past surgical history, social history and family history with the patient and they are unchanged from previous note.  ALLERGIES:  has No Known Allergies.  MEDICATIONS:  Current Outpatient Medications  Medication Sig Dispense Refill  . levothyroxine (SYNTHROID, LEVOTHROID) 50 MCG tablet Take 50 mcg by mouth daily.    Marland Kitchen PROGESTERONE MICRONIZED PO Take 200 mg/day by mouth.     No current facility-administered medications for this visit.     PHYSICAL EXAMINATION: ECOG PERFORMANCE STATUS: 1 - Symptomatic but completely ambulatory  Vitals:   09/19/17 0835  BP: 117/67  Pulse: 83  Resp: 18  Temp: 97.7 F (36.5 C)  SpO2: 100%   Filed Weights   09/19/17 0835  Weight: 137 lb 14.4 oz (62.6 kg)    GENERAL:alert, no distress and comfortable SKIN: skin color, texture, turgor are normal, no rashes or significant lesions EYES: normal, Conjunctiva are pink and non-injected, sclera clear OROPHARYNX:no exudate, no erythema and lips, buccal mucosa, and tongue normal  NECK: supple, thyroid normal size, non-tender, without nodularity LYMPH:  no palpable lymphadenopathy in the cervical, axillary or inguinal LUNGS: clear to auscultation and percussion with normal breathing effort HEART: regular rate & rhythm and no murmurs and no lower extremity edema ABDOMEN:abdomen soft, non-tender  and normal bowel sounds MUSCULOSKELETAL:no cyanosis of digits and no clubbing  NEURO: alert & oriented x 3 with fluent speech, no focal motor/sensory deficits EXTREMITIES: No lower extremity edema  LABORATORY DATA:  I have reviewed the data as listed   Chemistry        Component Value Date/Time   NA 141 09/04/2017 1207   K 3.7 09/04/2017 1207   CO2 28 09/04/2017 1207   BUN 10.4 09/04/2017 1207   CREATININE 0.8 09/04/2017 1207      Component Value Date/Time   CALCIUM 9.5 09/04/2017 1207   ALKPHOS 61 09/04/2017 1207   AST 16 09/04/2017 1207   ALT 11 09/04/2017 1207   BILITOT 0.71 09/04/2017 1207       Lab Results  Component Value Date   WBC 5.2 09/04/2017   HGB 13.5 09/04/2017   HCT 40.4 09/04/2017   MCV 87.8 09/04/2017   PLT 205 09/04/2017   NEUTROABS 3.5 09/04/2017    ASSESSMENT & PLAN:  Malignant neoplasm of upper-outer quadrant of left breast in female, estrogen receptor positive ([place]) 08/29/2017: Screening detected left breast mass 4 cm at 12 o'clock position, 1.4 cm lymph node the axilla, biopsy of the mass and the lymph nodes were positive for IDC with DCIS grade 3 ER 70% PR 0%, KI 2%, HER-2 negative ratio 1.77, T2N1 stage IIIa clinical stage AJCC 8  Mammaprint high risk luminal type B.  Average 10-year risk of recurrence untreated 29%; with chemotherapy and hormonal therapy distant metastasis free interval at 5 years 94.6%  09/11/2017 breast MRI: Left breast malignancy with associated linear NME extending posteriorly as well as satellite nodules measures up to 5.3 cm, at least 4 abnormal left axillary lymph nodes  Recommendation: 1.  Neoadjuvant chemotherapy with dose dense Adriamycin and Cytoxan followed by Taxol x12  2.  Followed by breast conserving surgery and targeted lymph node dissection 3.  Followed by radiation 4.  Follow-up antiestrogen therapy  I reviewed the MRI and CT scan images with the patient Will request port placement, echocardiogram, chemo education, start chemotherapy in 2 weeks  UPBEAT clinical trial ([place]): Newly diagnosed stage I to III breast cancer patients receiving either adjuvant or neoadjuvant chemotherapy undergo cardiac MRI before treatment and at 24 months along with neurocognitive testing,  exercise and disability measures at baseline 3, 12 and 24 months.  Patient is concerned about going to work during chemo.  She will inquire about FMLA.  I spent 45 minutes talking to the patient of which more than half was spent in counseling and coordination of care.  No orders of the defined types were placed in this encounter.  The patient has a good understanding of the overall plan. she agrees with it. she will call with any problems that may develop before the next visit here.   Rulon Eisenmenger, MD 09/19/17

## 2017-09-19 NOTE — Assessment & Plan Note (Signed)
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  Average 10-year risk of recurrence untreated 29%; with chemotherapy and hormonal therapy distant metastasis free interval at 5 years 94.6%  09/11/2017 breast MRI: Left breast malignancy with associated linear NME extending posteriorly as well as satellite nodules measures up to 5.3 cm, at least 4 abnormal left axillary lymph nodes  Recommendation: 1.  Neoadjuvant chemotherapy with dose dense Adriamycin and Cytoxan followed by Taxol x12  2.  Followed by breast conserving surgery and targeted lymph node dissection 3.  Followed by radiation 4.  Follow-up antiestrogen therapy  Will request port placement, echocardiogram, chemo education, start chemotherapy in 2 weeks  UPBEAT clinical trial ([place]): Newly diagnosed stage I to III breast cancer patients receiving either adjuvant or neoadjuvant chemotherapy undergo cardiac MRI before treatment and at 24 months along with neurocognitive testing, exercise and disability measures at baseline 3, 12 and 24 months.

## 2017-09-19 NOTE — Telephone Encounter (Signed)
Gave patient AVS and calendar of upcoming December and January  appointments

## 2017-09-20 ENCOUNTER — Telehealth: Payer: Self-pay | Admitting: Hematology and Oncology

## 2017-09-20 NOTE — Telephone Encounter (Signed)
Spoke to patient regarding upcoming appointment updates in December.

## 2017-09-23 ENCOUNTER — Encounter: Payer: Self-pay | Admitting: *Deleted

## 2017-09-24 ENCOUNTER — Other Ambulatory Visit: Payer: 59

## 2017-09-24 ENCOUNTER — Ambulatory Visit (HOSPITAL_COMMUNITY)
Admission: RE | Admit: 2017-09-24 | Discharge: 2017-09-24 | Disposition: A | Discharge status: Home / Self Care | Payer: 59 | Source: Ambulatory Visit | Attending: Hematology and Oncology | Admitting: Hematology and Oncology

## 2017-09-24 ENCOUNTER — Encounter: Payer: Self-pay | Admitting: *Deleted

## 2017-09-24 DIAGNOSIS — Z853 Personal history of malignant neoplasm of breast: Secondary | ICD-10-CM | POA: Insufficient documentation

## 2017-09-24 DIAGNOSIS — Z79899 Other long term (current) drug therapy: Secondary | ICD-10-CM | POA: Diagnosis not present

## 2017-09-24 DIAGNOSIS — Z5181 Encounter for therapeutic drug level monitoring: Secondary | ICD-10-CM | POA: Insufficient documentation

## 2017-09-24 NOTE — Progress Notes (Signed)
  Echocardiogram 2D Echocardiogram has been performed.  Tonya Jackson M 09/24/2017, 12:40 PM

## 2017-09-25 ENCOUNTER — Telehealth: Payer: Self-pay | Admitting: *Deleted

## 2017-09-25 ENCOUNTER — Encounter (HOSPITAL_BASED_OUTPATIENT_CLINIC_OR_DEPARTMENT_OTHER): Payer: Self-pay | Admitting: *Deleted

## 2017-09-25 ENCOUNTER — Other Ambulatory Visit: Payer: Self-pay

## 2017-09-25 NOTE — Telephone Encounter (Signed)
Called pt and discussed port placement. Denies questions Pt will bring FMLA paperwork for Dr. Lindi Adie to fill out.

## 2017-09-26 ENCOUNTER — Ambulatory Visit: Payer: Self-pay | Admitting: General Surgery

## 2017-09-26 ENCOUNTER — Telehealth: Payer: Self-pay | Admitting: *Deleted

## 2017-09-26 NOTE — Telephone Encounter (Signed)
Pt brought FMLA paperwork to office. FMLA form filled out for HIM and placed in assigned pick up box

## 2017-09-27 ENCOUNTER — Ambulatory Visit (HOSPITAL_BASED_OUTPATIENT_CLINIC_OR_DEPARTMENT_OTHER): Payer: 59 | Admitting: Certified Registered"

## 2017-09-27 ENCOUNTER — Ambulatory Visit (HOSPITAL_COMMUNITY): Payer: 59

## 2017-09-27 ENCOUNTER — Encounter (HOSPITAL_BASED_OUTPATIENT_CLINIC_OR_DEPARTMENT_OTHER): Payer: Self-pay | Admitting: Certified Registered"

## 2017-09-27 ENCOUNTER — Ambulatory Visit (HOSPITAL_BASED_OUTPATIENT_CLINIC_OR_DEPARTMENT_OTHER)
Admission: RE | Admit: 2017-09-27 | Discharge: 2017-09-27 | Disposition: A | Discharge status: Home / Self Care | Payer: 59 | Source: Ambulatory Visit | Attending: General Surgery | Admitting: General Surgery

## 2017-09-27 ENCOUNTER — Encounter (HOSPITAL_BASED_OUTPATIENT_CLINIC_OR_DEPARTMENT_OTHER)
Admission: RE | Disposition: A | Discharge status: Home / Self Care | Payer: Self-pay | Source: Ambulatory Visit | Attending: General Surgery

## 2017-09-27 ENCOUNTER — Other Ambulatory Visit: Payer: Self-pay

## 2017-09-27 DIAGNOSIS — K219 Gastro-esophageal reflux disease without esophagitis: Secondary | ICD-10-CM | POA: Insufficient documentation

## 2017-09-27 DIAGNOSIS — C50919 Malignant neoplasm of unspecified site of unspecified female breast: Secondary | ICD-10-CM

## 2017-09-27 DIAGNOSIS — Z170 Estrogen receptor positive status [ER+]: Secondary | ICD-10-CM | POA: Diagnosis not present

## 2017-09-27 DIAGNOSIS — Z803 Family history of malignant neoplasm of breast: Secondary | ICD-10-CM | POA: Diagnosis not present

## 2017-09-27 DIAGNOSIS — C50412 Malignant neoplasm of upper-outer quadrant of left female breast: Secondary | ICD-10-CM | POA: Insufficient documentation

## 2017-09-27 DIAGNOSIS — Z95828 Presence of other vascular implants and grafts: Secondary | ICD-10-CM

## 2017-09-27 HISTORY — PX: PORTACATH PLACEMENT: SHX2246

## 2017-09-27 SURGERY — INSERTION, TUNNELED CENTRAL VENOUS DEVICE, WITH PORT
Anesthesia: General | Site: Chest | Laterality: Right

## 2017-09-27 MED ORDER — HEPARIN SOD (PORK) LOCK FLUSH 100 UNIT/ML IV SOLN
INTRAVENOUS | Status: AC
  Filled 2017-09-27: qty 5

## 2017-09-27 MED ORDER — FENTANYL CITRATE (PF) 100 MCG/2ML IJ SOLN
50.0000 ug | INTRAMUSCULAR | Status: DC | PRN
  Administered 2017-09-27: 100 ug via INTRAVENOUS

## 2017-09-27 MED ORDER — HYDROCODONE-ACETAMINOPHEN 5-325 MG PO TABS: 1.0000 | ORAL_TABLET | Freq: Four times a day (QID) | ORAL | 0 refills | Status: DC | PRN

## 2017-09-27 MED ORDER — HEPARIN (PORCINE) IN NACL 2-0.9 UNIT/ML-% IJ SOLN
INTRAMUSCULAR | Status: AC | PRN
  Administered 2017-09-27: 500 mL

## 2017-09-27 MED ORDER — DEXAMETHASONE SODIUM PHOSPHATE 4 MG/ML IJ SOLN
INTRAMUSCULAR | Status: DC | PRN
  Administered 2017-09-27: 10 mg via INTRAVENOUS

## 2017-09-27 MED ORDER — CHLORHEXIDINE GLUCONATE CLOTH 2 % EX PADS: 6.0000 | MEDICATED_PAD | Freq: Once | CUTANEOUS | Status: DC

## 2017-09-27 MED ORDER — BUPIVACAINE HCL (PF) 0.25 % IJ SOLN
INTRAMUSCULAR | Status: AC
  Filled 2017-09-27: qty 30

## 2017-09-27 MED ORDER — LIDOCAINE HCL (CARDIAC) 20 MG/ML IV SOLN
INTRAVENOUS | Status: DC | PRN
  Administered 2017-09-27: 60 mg via INTRAVENOUS

## 2017-09-27 MED ORDER — MIDAZOLAM HCL 2 MG/2ML IJ SOLN
1.0000 mg | INTRAMUSCULAR | Status: DC | PRN
  Administered 2017-09-27: 2 mg via INTRAVENOUS

## 2017-09-27 MED ORDER — CELECOXIB 200 MG PO CAPS
ORAL_CAPSULE | ORAL | Status: AC
  Filled 2017-09-27: qty 1

## 2017-09-27 MED ORDER — ONDANSETRON HCL 4 MG/2ML IJ SOLN
INTRAMUSCULAR | Status: DC | PRN
  Administered 2017-09-27: 4 mg via INTRAVENOUS

## 2017-09-27 MED ORDER — ACETAMINOPHEN 500 MG PO TABS
ORAL_TABLET | ORAL | Status: AC
  Filled 2017-09-27: qty 2

## 2017-09-27 MED ORDER — BUPIVACAINE HCL (PF) 0.25 % IJ SOLN
INTRAMUSCULAR | Status: DC | PRN
  Administered 2017-09-27: 6 mL

## 2017-09-27 MED ORDER — CEFAZOLIN SODIUM-DEXTROSE 2-4 GM/100ML-% IV SOLN
2.0000 g | INTRAVENOUS | Status: AC
  Administered 2017-09-27: 2 g via INTRAVENOUS

## 2017-09-27 MED ORDER — SCOPOLAMINE 1 MG/3DAYS TD PT72: 1.0000 | MEDICATED_PATCH | Freq: Once | TRANSDERMAL | Status: DC | PRN

## 2017-09-27 MED ORDER — GABAPENTIN 300 MG PO CAPS
300.0000 mg | ORAL_CAPSULE | ORAL | Status: AC
  Administered 2017-09-27: 300 mg via ORAL

## 2017-09-27 MED ORDER — PROPOFOL 10 MG/ML IV BOLUS
INTRAVENOUS | Status: DC | PRN
  Administered 2017-09-27: 150 mg via INTRAVENOUS

## 2017-09-27 MED ORDER — LACTATED RINGERS IV SOLN
INTRAVENOUS | Status: DC
  Administered 2017-09-27 (×2): via INTRAVENOUS

## 2017-09-27 MED ORDER — HEPARIN SOD (PORK) LOCK FLUSH 100 UNIT/ML IV SOLN
INTRAVENOUS | Status: DC | PRN
  Administered 2017-09-27: 500 [IU] via INTRAVENOUS

## 2017-09-27 MED ORDER — MIDAZOLAM HCL 2 MG/2ML IJ SOLN
INTRAMUSCULAR | Status: AC
  Filled 2017-09-27: qty 2

## 2017-09-27 MED ORDER — GABAPENTIN 300 MG PO CAPS
ORAL_CAPSULE | ORAL | Status: AC
  Filled 2017-09-27: qty 1

## 2017-09-27 MED ORDER — FENTANYL CITRATE (PF) 100 MCG/2ML IJ SOLN
INTRAMUSCULAR | Status: AC
  Filled 2017-09-27: qty 2

## 2017-09-27 MED ORDER — CEFAZOLIN SODIUM-DEXTROSE 2-4 GM/100ML-% IV SOLN
INTRAVENOUS | Status: AC
  Filled 2017-09-27: qty 100

## 2017-09-27 MED ORDER — HEPARIN (PORCINE) IN NACL 2-0.9 UNIT/ML-% IJ SOLN
INTRAMUSCULAR | Status: AC
  Filled 2017-09-27: qty 500

## 2017-09-27 MED ORDER — CELECOXIB 200 MG PO CAPS
200.0000 mg | ORAL_CAPSULE | ORAL | Status: AC
  Administered 2017-09-27: 200 mg via ORAL

## 2017-09-27 MED ORDER — ACETAMINOPHEN 500 MG PO TABS
1000.0000 mg | ORAL_TABLET | ORAL | Status: AC
  Administered 2017-09-27: 1000 mg via ORAL

## 2017-09-27 MED ORDER — FENTANYL CITRATE (PF) 100 MCG/2ML IJ SOLN: 25.0000 ug | INTRAMUSCULAR | Status: DC | PRN

## 2017-09-27 SURGICAL SUPPLY — 31 items
BAG DECANTER FOR FLEXI CONT (MISCELLANEOUS) ×2 IMPLANT
BLADE SURG 15 STRL LF DISP TIS (BLADE) ×1 IMPLANT
BLADE SURG 15 STRL SS (BLADE) ×1
CHLORAPREP W/TINT 26ML (MISCELLANEOUS) ×2 IMPLANT
CLEANER CAUTERY TIP 5X5 PAD (MISCELLANEOUS) ×1 IMPLANT
COVER BACK TABLE 60X90IN (DRAPES) ×2 IMPLANT
COVER MAYO STAND STRL (DRAPES) ×2 IMPLANT
DERMABOND ADVANCED (GAUZE/BANDAGES/DRESSINGS) ×1
DERMABOND ADVANCED .7 DNX12 (GAUZE/BANDAGES/DRESSINGS) ×1 IMPLANT
DRAPE C-ARM 42X72 X-RAY (DRAPES) ×2 IMPLANT
DRAPE LAPAROSCOPIC ABDOMINAL (DRAPES) ×2 IMPLANT
DRAPE UTILITY XL STRL (DRAPES) ×2 IMPLANT
ELECT REM PT RETURN 9FT ADLT (ELECTROSURGICAL) ×2
ELECTRODE REM PT RTRN 9FT ADLT (ELECTROSURGICAL) ×1 IMPLANT
GLOVE BIO SURGEON STRL SZ7.5 (GLOVE) ×2 IMPLANT
GOWN STRL REUS W/ TWL LRG LVL3 (GOWN DISPOSABLE) ×2 IMPLANT
GOWN STRL REUS W/TWL LRG LVL3 (GOWN DISPOSABLE) ×2
KIT PORT POWER 8FR ISP CVUE (Miscellaneous) ×4 IMPLANT
NEEDLE HYPO 25X1 1.5 SAFETY (NEEDLE) ×2 IMPLANT
PACK BASIN DAY SURGERY FS (CUSTOM PROCEDURE TRAY) ×2 IMPLANT
PAD CLEANER CAUTERY TIP 5X5 (MISCELLANEOUS) ×1
PENCIL BUTTON HOLSTER BLD 10FT (ELECTRODE) ×2 IMPLANT
SLEEVE SCD COMPRESS KNEE MED (MISCELLANEOUS) ×2 IMPLANT
SUT MON AB 4-0 PC3 18 (SUTURE) ×2 IMPLANT
SUT PROLENE 2 0 SH DA (SUTURE) ×2 IMPLANT
SUT VIC AB 3-0 SH 27 (SUTURE) ×1
SUT VIC AB 3-0 SH 27X BRD (SUTURE) ×1 IMPLANT
SYR 5ML LL (SYRINGE) ×2 IMPLANT
SYR CONTROL 10ML LL (SYRINGE) ×4 IMPLANT
TOWEL OR 17X24 6PK STRL BLUE (TOWEL DISPOSABLE) ×4 IMPLANT
TOWEL OR NON WOVEN STRL DISP B (DISPOSABLE) ×2 IMPLANT

## 2017-09-27 NOTE — Discharge Instructions (Signed)

## 2017-09-27 NOTE — Anesthesia Preprocedure Evaluation (Signed)
Anesthesia Evaluation  Patient identified by MRN, date of birth, ID band Patient awake    Reviewed: Allergy & Precautions, NPO status , Patient's Chart, lab work & pertinent test results  Airway Mallampati: II  TM Distance: >3 FB Neck ROM: Full    Dental  (+) Dental Advisory Given   Pulmonary neg pulmonary ROS,    breath sounds clear to auscultation       Cardiovascular negative cardio ROS   Rhythm:Regular Rate:Normal     Neuro/Psych negative neurological ROS     GI/Hepatic negative GI ROS, Neg liver ROS,   Endo/Other  negative endocrine ROS  Renal/GU negative Renal ROS     Musculoskeletal   Abdominal   Peds  Hematology Breast ca   Anesthesia Other Findings   Reproductive/Obstetrics                             Anesthesia Physical Anesthesia Plan  ASA: II  Anesthesia Plan: General   Post-op Pain Management:    Induction: Intravenous  PONV Risk Score and Plan: 3 and Ondansetron, Dexamethasone, Midazolam and Treatment may vary due to age or medical condition  Airway Management Planned: LMA  Additional Equipment:   Intra-op Plan:   Post-operative Plan: Extubation in OR  Informed Consent: I have reviewed the patients History and Physical, chart, labs and discussed the procedure including the risks, benefits and alternatives for the proposed anesthesia with the patient or authorized representative who has indicated his/her understanding and acceptance.   Dental advisory given  Plan Discussed with:   Anesthesia Plan Comments:         Anesthesia Quick Evaluation

## 2017-09-27 NOTE — Anesthesia Postprocedure Evaluation (Signed)
Anesthesia Post Note  Patient: Tonya Jackson  Procedure(s) Performed: INSERTION PORT-A-CATH (Right Chest)     Patient location during evaluation: PACU Anesthesia Type: General Level of consciousness: awake and alert Pain management: pain level controlled Vital Signs Assessment: post-procedure vital signs reviewed and stable Respiratory status: spontaneous breathing, nonlabored ventilation, respiratory function stable and patient connected to nasal cannula oxygen Cardiovascular status: blood pressure returned to baseline and stable Postop Assessment: no apparent nausea or vomiting Anesthetic complications: no    Last Vitals:  Vitals:   09/27/17 1554 09/27/17 1612  BP: 107/68 (!) 119/58  Pulse: 82 87  Resp: 18 16  Temp:  (!) 36.3 C  SpO2: 100% 100%    Last Pain:  Vitals:   09/27/17 1612  TempSrc: Oral  PainSc: 0-No pain                 Tiajuana Amass

## 2017-09-27 NOTE — Transfer of Care (Signed)
Immediate Anesthesia Transfer of Care Note  Patient: Tonya Jackson  Procedure(s) Performed: INSERTION PORT-A-CATH (Right Chest)  Patient Location: PACU  Anesthesia Type:General  Level of Consciousness: awake and patient cooperative  Airway & Oxygen Therapy: Patient Spontanous Breathing and Patient connected to face mask oxygen  Post-op Assessment: Report given to RN and Post -op Vital signs reviewed and stable  Post vital signs: Reviewed and stable  Last Vitals:  Vitals:   09/27/17 1527 09/27/17 1528  BP: 101/66   Pulse: 91 84  Resp: 18 17  Temp:    SpO2: 100% 100%    Last Pain:  Vitals:   09/27/17 1346  TempSrc: Oral         Complications: No apparent anesthesia complications

## 2017-09-27 NOTE — Interval H&P Note (Signed)
History and Physical Interval Note:  09/27/2017 2:08 PM  Tonya Jackson  has presented today for surgery, with the diagnosis of Left Breast Cancer/ Poor Venous Access  The various methods of treatment have been discussed with the patient and family. After consideration of risks, benefits and other options for treatment, the patient has consented to  Procedure(s): INSERTION PORT-A-CATH (N/A) as a surgical intervention .  The patient's history has been reviewed, patient examined, no change in status, stable for surgery.  I have reviewed the patient's chart and labs.  Questions were answered to the patient's satisfaction.     TOTH III,PAUL S

## 2017-09-27 NOTE — H&P (Signed)
Tonya Jackson  Location: [facility] Patient #: [id number] DOB: [birth year] Undefined / Language: [name] / Race: Undefined Female   History of Present Illness  The patient is a 51 year old female who presents with breast cancer. we're asked to see the patient in consultation by Dr. Isidore Moos to evaluate her for a new left breast cancer. The patient is a 51 year old white female who presents with a mass in the upper portion of the left breast that she found about 1 month ago. The mass measures 4 cm and she has a positive lymph node. The mass was biopsied and came back as an invasive ductal cancer that was ER positive and PR negative and HER-2 negative with a Ki-67 of 2%. She denies any breast pain or discharge from the nipple. She has a family history of breast cancer in her grandmother   Past Surgical History  Appendectomy  Breast Biopsy  Left. Oral Surgery   Diagnostic Studies History  Colonoscopy  never  Medication History  Medications Reconciled  Social History  Alcohol use  Occasional alcohol use. Caffeine use  Coffee, Tea. No drug use  Tobacco use  Never smoker.  Family History  Family history unknown  First Degree Relatives   Other Problems  Gastroesophageal Reflux Disease  Lump In Breast  Thyroid Disease     Review of Systems  General Not Present- Appetite Loss, Chills, Fatigue, Fever, Night Sweats, Weight Gain and Weight Loss. Skin Not Present- Change in Wart/Mole, Dryness, Hives, Jaundice, New Lesions, Non-Healing Wounds, Rash and Ulcer. HEENT Present- Seasonal Allergies and Wears glasses/contact lenses. Not Present- Earache, Hearing Loss, Hoarseness, Nose Bleed, Oral Ulcers, Ringing in the Ears, Sinus Pain, Sore Throat, Visual Disturbances and Yellow Eyes. Respiratory Not Present- Bloody sputum, Chronic Cough, Difficulty Breathing, Snoring and Wheezing. Breast Present- Breast Mass and Breast Pain. Not Present- Nipple Discharge and Skin  Changes. Cardiovascular Not Present- Chest Pain, Difficulty Breathing Lying Down, Leg Cramps, Palpitations, Rapid Heart Rate, Shortness of Breath and Swelling of Extremities. Gastrointestinal Present- Bloating, Chronic diarrhea, Constipation and Excessive gas. Not Present- Abdominal Pain, Bloody Stool, Change in Bowel Habits, Difficulty Swallowing, Gets full quickly at meals, Hemorrhoids, Indigestion, Nausea, Rectal Pain and Vomiting. Female Genitourinary Not Present- Frequency, Nocturia, Painful Urination, Pelvic Pain and Urgency. Musculoskeletal Not Present- Back Pain, Joint Pain, Joint Stiffness, Muscle Pain, Muscle Weakness and Swelling of Extremities. Neurological Present- Headaches. Not Present- Decreased Memory, Fainting, Numbness, Seizures, Tingling, Tremor, Trouble walking and Weakness. Psychiatric Present- Frequent crying. Not Present- Anxiety, Bipolar, Change in Sleep Pattern, Depression and Fearful. Endocrine Not Present- Cold Intolerance, Excessive Hunger, Hair Changes, Heat Intolerance, Hot flashes and New Diabetes. Hematology Present- Easy Bruising. Not Present- Blood Thinners, Excessive bleeding, Gland problems, HIV and Persistent Infections.   Physical Exam  General Mental Status-Alert. General Appearance-Consistent with stated age. Hydration-Well hydrated. Voice-Normal.  Head and Neck Head-normocephalic, atraumatic with no lesions or palpable masses. Trachea-midline. Thyroid Gland Characteristics - normal size and consistency.  Eye Eyeball - Bilateral-Extraocular movements intact. Sclera/Conjunctiva - Bilateral-No scleral icterus.  Chest and Lung Exam Chest and lung exam reveals -quiet, even and easy respiratory effort with no use of accessory muscles and on auscultation, normal breath sounds, no adventitious sounds and normal vocal resonance. Inspection Chest Wall - Normal. Back - normal.  Breast Note: there is a palpable 4 cm mass in the upper  portion of the left breast. It does not appear to be tethered to the chest wall. There is a palpably enlarged lymph node  in the left axilla. There is no palpable mass in the right breast. There is no palpable right axillary, supraclavicular, or cervical lymphadenopathy   Cardiovascular Cardiovascular examination reveals -normal heart sounds, regular rate and rhythm with no murmurs and normal pedal pulses bilaterally.  Abdomen Inspection Inspection of the abdomen reveals - No Hernias. Skin - Scar - no surgical scars. Palpation/Percussion Palpation and Percussion of the abdomen reveal - Soft, Non Tender, No Rebound tenderness, No Rigidity (guarding) and No hepatosplenomegaly. Auscultation Auscultation of the abdomen reveals - Bowel sounds normal.  Neurologic Neurologic evaluation reveals -alert and oriented x 3 with no impairment of recent or remote memory. Mental Status-Normal.  Musculoskeletal Normal Exam - Left-Upper Extremity Strength Normal and Lower Extremity Strength Normal. Normal Exam - Right-Upper Extremity Strength Normal and Lower Extremity Strength Normal.  Lymphatic Head & Neck  General Head & Neck Lymphatics: Bilateral - Description - Normal. Axillary  General Axillary Region: Bilateral - Description - Normal. Tenderness - Non Tender. Femoral & Inguinal  Generalized Femoral & Inguinal Lymphatics: Bilateral - Description - Normal. Tenderness - Non Tender.    Assessment & Plan  MALIGNANT NEOPLASM OF UPPER-OUTER QUADRANT OF LEFT BREAST IN FEMALE, ESTROGEN RECEPTOR POSITIVE (C50.412) Impression: the patient appears to have a large cancer in the upper left breast with a positive lymph node. I have talked to her in detail about the options for treatment. At this point the plan is to obtain a mammoprint. If this is high risk, then she will need a port for neoadjuvant chemotherapy. if this is low risk then she will consider neoadjuvant hormonal therapy. If the  tumor shrinks then she may become a candidate for breast conservation. Otherwise I think mastectomy with targeted node dissection would be her best option. She is in agreement with this plan. Current Plans Follow up with [place] in the office in 2 months.  Call us sooner as needed.

## 2017-09-27 NOTE — Anesthesia Procedure Notes (Signed)
Procedure Name: LMA Insertion Date/Time: 09/27/2017 2:33 PM Performed by: Annaleigh Steinmeyer, Ernesta Amble, CRNA Pre-anesthesia Checklist: Patient identified, Emergency Drugs available, Suction available and Patient being monitored Patient Re-evaluated:Patient Re-evaluated prior to induction Oxygen Delivery Method: Circle system utilized Preoxygenation: Pre-oxygenation with 100% oxygen Induction Type: IV induction Ventilation: Mask ventilation without difficulty LMA: LMA inserted LMA Size: 4.0 Number of attempts: 1 Airway Equipment and Method: Bite block Placement Confirmation: positive ETCO2 Tube secured with: Tape Dental Injury: Teeth and Oropharynx as per pre-operative assessment

## 2017-09-27 NOTE — Op Note (Signed)
09/27/2017  3:26 PM  PATIENT:  Tonya Jackson  51 y.o. female  PRE-OPERATIVE DIAGNOSIS:  Left Breast Cancer/ Poor Venous Access  POST-OPERATIVE DIAGNOSIS:  Left Breast Cancer/ Poor Venous Access  PROCEDURE:  Procedure(s): INSERTION PORT-A-CATH (Right)  SURGEON:  Surgeon(s) and Role:    * Jovita Kussmaul, MD - Primary  PHYSICIAN ASSISTANT:   ASSISTANTS: none   ANESTHESIA:   local and general  EBL:  minimal   BLOOD ADMINISTERED:none  DRAINS: none   LOCAL MEDICATIONS USED:  MARCAINE     SPECIMEN:  No Specimen  DISPOSITION OF SPECIMEN:  N/A  COUNTS:  YES  TOURNIQUET:  * No tourniquets in log *  DICTATION: .Dragon Dictation   After informed consent was obtained the patient was brought to the operating room and placed in the supine position on the operating table.  After adequate induction of general anesthesia roll was placed between the patient's shoulder blades to extend the shoulder slightly.  The right chest and neck area were then prepped with ChloraPrep, allowed to dry, and draped in usual sterile manner.  An appropriate timeout was performed.  The patient was placed in Trendelenburg position.  The area lateral to the bend of the clavicle on the right chest wall was infiltrated with quarter percent Marcaine.  A large bore needle from the Port-A-Cath kit was used to slide beneath the bend of the clavicle heading towards the sternal notch and in doing so I was able to access the right subclavian vein without difficulty.  A wire was fed through the needle using the Seldinger technique without difficulty.  The wire was confirmed in the central venous system using real-time fluoroscopy.  Next a small incision was made at the wire entry site on the right chest wall with a 15 blade knife.  The incision was carried through the skin and subcutaneous tissue sharply with electrocautery.  A subcutaneous pocket was then created inferior to the incision by blunt finger dissection.  The  tubing was placed on the reservoir and the reservoir was placed in the pocket.  The length of the tubing was estimated using real-time fluoroscopy and cut to the appropriate length.  Next a sheath and dilator were fed over the wire also using the Seldinger technique without difficulty.  The dilator and wire were removed.  The tubing was fed through the sheath as far as it would go and then held in place while the sheath was gently cracked and separated.  Another real-time fluoroscopy image showed the tip of the catheter to be in the distal superior vena cava.  Next the tubing was permanently anchored to the reservoir.  The reservoir was anchored in the pocket with 2 2-0 Prolene stitches.  The port was then aspirated and it aspirated blood easily.  The port was then flushed initially with a dilute heparin solution and then with a more concentrated heparin solution.  The subcutaneous tissue was then closed over the port with interrupted 3-0 Vicryl stitches.  The skin was then closed with a running 4-0 Monocryl subcuticular stitch.  Dermabond dressings were applied.  The patient tolerated the procedure well.  At the end of the case all needle sponge and instrument counts were correct.  The patient was then awakened and taken to recovery in stable condition.  PLAN OF CARE: Discharge to home after PACU  PATIENT DISPOSITION:  PACU - hemodynamically stable.   Delay start of Pharmacological VTE agent (>24hrs) due to surgical blood loss or risk of  bleeding: not applicable

## 2017-09-30 ENCOUNTER — Encounter (HOSPITAL_BASED_OUTPATIENT_CLINIC_OR_DEPARTMENT_OTHER): Payer: Self-pay | Admitting: General Surgery

## 2017-10-01 ENCOUNTER — Encounter (HOSPITAL_BASED_OUTPATIENT_CLINIC_OR_DEPARTMENT_OTHER): Payer: Self-pay | Admitting: General Surgery

## 2017-10-02 ENCOUNTER — Ambulatory Visit (HOSPITAL_BASED_OUTPATIENT_CLINIC_OR_DEPARTMENT_OTHER): Payer: 59 | Admitting: Hematology and Oncology

## 2017-10-02 ENCOUNTER — Other Ambulatory Visit (HOSPITAL_BASED_OUTPATIENT_CLINIC_OR_DEPARTMENT_OTHER): Payer: 59

## 2017-10-02 ENCOUNTER — Ambulatory Visit (HOSPITAL_BASED_OUTPATIENT_CLINIC_OR_DEPARTMENT_OTHER): Payer: 59

## 2017-10-02 ENCOUNTER — Encounter: Payer: Self-pay | Admitting: *Deleted

## 2017-10-02 ENCOUNTER — Ambulatory Visit: Payer: 59

## 2017-10-02 DIAGNOSIS — C773 Secondary and unspecified malignant neoplasm of axilla and upper limb lymph nodes: Secondary | ICD-10-CM

## 2017-10-02 DIAGNOSIS — C50412 Malignant neoplasm of upper-outer quadrant of left female breast: Secondary | ICD-10-CM

## 2017-10-02 DIAGNOSIS — Z170 Estrogen receptor positive status [ER+]: Secondary | ICD-10-CM

## 2017-10-02 DIAGNOSIS — Z5111 Encounter for antineoplastic chemotherapy: Secondary | ICD-10-CM

## 2017-10-02 DIAGNOSIS — Z5189 Encounter for other specified aftercare: Secondary | ICD-10-CM

## 2017-10-02 LAB — CBC WITH DIFFERENTIAL/PLATELET
BASO%: 0.6 % (ref 0.0–2.0)
Basophils Absolute: 0 10*3/uL (ref 0.0–0.1)
EOS%: 2.3 % (ref 0.0–7.0)
Eosinophils Absolute: 0.1 10*3/uL (ref 0.0–0.5)
HEMATOCRIT: 38 % (ref 34.8–46.6)
HGB: 12.6 g/dL (ref 11.6–15.9)
LYMPH#: 0.8 10*3/uL — AB (ref 0.9–3.3)
LYMPH%: 20.2 % (ref 14.0–49.7)
MCH: 29.3 pg (ref 25.1–34.0)
MCHC: 33.1 g/dL (ref 31.5–36.0)
MCV: 88.5 fL (ref 79.5–101.0)
MONO#: 0.3 10*3/uL (ref 0.1–0.9)
MONO%: 6.9 % (ref 0.0–14.0)
NEUT#: 2.8 10*3/uL (ref 1.5–6.5)
NEUT%: 70 % (ref 38.4–76.8)
Platelets: 183 10*3/uL (ref 145–400)
RBC: 4.3 10*6/uL (ref 3.70–5.45)
RDW: 13.8 % (ref 11.2–14.5)
WBC: 4 10*3/uL (ref 3.9–10.3)

## 2017-10-02 LAB — COMPREHENSIVE METABOLIC PANEL
ALBUMIN: 4.2 g/dL (ref 3.5–5.0)
ALK PHOS: 51 U/L (ref 40–150)
ALT: 9 U/L (ref 0–55)
ANION GAP: 10 meq/L (ref 3–11)
AST: 15 U/L (ref 5–34)
BILIRUBIN TOTAL: 0.81 mg/dL (ref 0.20–1.20)
BUN: 7.2 mg/dL (ref 7.0–26.0)
CO2: 27 mEq/L (ref 22–29)
CREATININE: 0.7 mg/dL (ref 0.6–1.1)
Calcium: 9.3 mg/dL (ref 8.4–10.4)
Chloride: 104 mEq/L (ref 98–109)
GLUCOSE: 92 mg/dL (ref 70–140)
Potassium: 3.7 mEq/L (ref 3.5–5.1)
SODIUM: 141 meq/L (ref 136–145)
TOTAL PROTEIN: 7.3 g/dL (ref 6.4–8.3)

## 2017-10-02 MED ORDER — PEGFILGRASTIM 6 MG/0.6ML ~~LOC~~ PSKT
PREFILLED_SYRINGE | SUBCUTANEOUS | Status: AC
  Filled 2017-10-02: qty 0.6

## 2017-10-02 MED ORDER — PALONOSETRON HCL INJECTION 0.25 MG/5ML
0.2500 mg | Freq: Once | INTRAVENOUS | Status: AC
  Administered 2017-10-02: 0.25 mg via INTRAVENOUS

## 2017-10-02 MED ORDER — DOXORUBICIN HCL CHEMO IV INJECTION 2 MG/ML
60.0000 mg/m2 | Freq: Once | INTRAVENOUS | Status: AC
  Administered 2017-10-02: 102 mg via INTRAVENOUS
  Filled 2017-10-02: qty 51

## 2017-10-02 MED ORDER — SODIUM CHLORIDE 0.9% FLUSH
10.0000 mL | INTRAVENOUS | Status: DC | PRN
  Administered 2017-10-02: 10 mL
  Filled 2017-10-02: qty 10

## 2017-10-02 MED ORDER — SODIUM CHLORIDE 0.9 % IV SOLN
600.0000 mg/m2 | Freq: Once | INTRAVENOUS | Status: AC
  Administered 2017-10-02: 1020 mg via INTRAVENOUS
  Filled 2017-10-02: qty 51

## 2017-10-02 MED ORDER — SODIUM CHLORIDE 0.9 % IV SOLN
Freq: Once | INTRAVENOUS | Status: AC
  Administered 2017-10-02: 11:00:00 via INTRAVENOUS
  Filled 2017-10-02: qty 5

## 2017-10-02 MED ORDER — PEGFILGRASTIM 6 MG/0.6ML ~~LOC~~ PSKT
6.0000 mg | PREFILLED_SYRINGE | Freq: Once | SUBCUTANEOUS | Status: AC
  Administered 2017-10-02: 6 mg via SUBCUTANEOUS

## 2017-10-02 MED ORDER — PALONOSETRON HCL INJECTION 0.25 MG/5ML
INTRAVENOUS | Status: AC
  Filled 2017-10-02: qty 5

## 2017-10-02 MED ORDER — HEPARIN SOD (PORK) LOCK FLUSH 100 UNIT/ML IV SOLN
500.0000 [IU] | Freq: Once | INTRAVENOUS | Status: AC | PRN
Start: 2017-10-02 — End: 2017-10-02
  Administered 2017-10-02: 500 [IU]
  Filled 2017-10-02: qty 5

## 2017-10-02 MED ORDER — SODIUM CHLORIDE 0.9 % IV SOLN
Freq: Once | INTRAVENOUS | Status: AC
  Administered 2017-10-02: 10:00:00 via INTRAVENOUS

## 2017-10-02 NOTE — Progress Notes (Signed)
Patient Care Team: [name], MD as PCP - General (Family Medicine)  DIAGNOSIS:  Encounter Diagnosis  Name Primary?  . Malignant neoplasm of upper-outer quadrant of left breast in female, estrogen receptor positive ([place])     SUMMARY OF ONCOLOGIC HISTORY:   Malignant neoplasm of upper-outer quadrant of left breast in female, estrogen receptor positive ([place])   08/29/2017 Initial Diagnosis    Screening detected left breast mass 4 cm at 12 o'clock position, 1.4 cm lymph node the axilla, biopsy of the mass and the lymph nodes were positive for IDC with DCIS grade 3 ER 70% PR 0%, KI 2%, HER-2 negative ratio 1.77, T2N1 stage IIIa clinical stage AJCC 8      09/11/2017 Breast MRI    Left breast malignancy with associated linear NME extending posteriorly as well as satellite nodules measures up to 5.3 cm, at least 4 abnormal left axillary lymph nodes      09/11/2017 Imaging    CT CAP and Bone scan negative for metastatic disease      09/18/2017 Miscellaneous    Mammaprint high risk luminal type B.  Average 10-year risk of recurrence untreated 29%; with chemotherapy and hormonal therapy distant metastasis free interval at 5 years 94.6%      10/02/2017 -  Neo-Adjuvant Chemotherapy    Neoadjuvant chemotherapy with dose dense Adriamycin and Cytoxan x4 followed by Taxol weekly x12       CHIEF COMPLIANT: Cycle 1 neoadjuvant dose dense Adriamycin and Cytoxan  INTERVAL HISTORY: Tonya Jackson is a 76-year with above-mentioned history of left breast cancer who is here to receive her first cycle of neoadjuvant chemotherapy with dose dense Adriamycin and Cytoxan.  She is anxious to get the chemotherapy started.  She denies any nausea vomiting.  She was able to fill all of her medications.  REVIEW OF SYSTEMS:   Constitutional: Denies fevers, chills or abnormal weight loss Eyes: Denies blurriness of vision Ears, nose, mouth, throat, and face: Denies mucositis or sore throat Respiratory:  Denies cough, dyspnea or wheezes Cardiovascular: Denies palpitation, chest discomfort Gastrointestinal:  Denies nausea, heartburn or change in bowel habits Skin: Denies abnormal skin rashes Lymphatics: Denies new lymphadenopathy or easy bruising Neurological:Denies numbness, tingling or new weaknesses Behavioral/Psych: Mood is stable, no new changes  Extremities: No lower extremity edema Breast:  denies any pain or lumps or nodules in either breasts All other systems were reviewed with the patient and are negative.  I have reviewed the past medical history, past surgical history, social history and family history with the patient and they are unchanged from previous note.  ALLERGIES:  is allergic to other.  MEDICATIONS:  Current Outpatient Medications  Medication Sig Dispense Refill  . HYDROcodone-acetaminophen (NORCO/VICODIN) 5-325 MG tablet Take 1-2 tablets by mouth every 6 (six) hours as needed for moderate pain or severe pain. 15 tablet 0  . lidocaine-prilocaine (EMLA) cream Apply to affected area once 30 g 3  . LORazepam (ATIVAN) 0.5 MG tablet Take 1 tablet (0.5 mg total) by mouth at bedtime as needed (Nausea or vomiting). 30 tablet 0  . ondansetron (ZOFRAN) 8 MG tablet Take 1 tablet (8 mg total) by mouth 2 (two) times daily as needed. Start on the third day after chemotherapy. 30 tablet 1  . prochlorperazine (COMPAZINE) 10 MG tablet Take 1 tablet (10 mg total) by mouth every 6 (six) hours as needed (Nausea or vomiting). 30 tablet 1   No current facility-administered medications for this visit.  PHYSICAL EXAMINATION: ECOG PERFORMANCE STATUS: 1 - Symptomatic but completely ambulatory  Vitals:   10/02/17 0911  BP: 126/69  Pulse: 87  Resp: 20  Temp: 98.3 F (36.8 C)  SpO2: 100%   Filed Weights   10/02/17 0911  Weight: 133 lb 9.6 oz (60.6 kg)    GENERAL:alert, no distress and comfortable SKIN: skin color, texture, turgor are normal, no rashes or significant  lesions EYES: normal, Conjunctiva are pink and non-injected, sclera clear OROPHARYNX:no exudate, no erythema and lips, buccal mucosa, and tongue normal  NECK: supple, thyroid normal size, non-tender, without nodularity LYMPH:  no palpable lymphadenopathy in the cervical, axillary or inguinal LUNGS: clear to auscultation and percussion with normal breathing effort HEART: regular rate & rhythm and no murmurs and no lower extremity edema ABDOMEN:abdomen soft, non-tender and normal bowel sounds MUSCULOSKELETAL:no cyanosis of digits and no clubbing  NEURO: alert & oriented x 3 with fluent speech, no focal motor/sensory deficits EXTREMITIES: No lower extremity edema BREAST: No palpable masses or nodules in either right or left breasts. No palpable axillary supraclavicular or infraclavicular adenopathy no breast tenderness or nipple discharge. (exam performed in the presence of a chaperone)  LABORATORY DATA:  I have reviewed the data as listed   Chemistry      Component Value Date/Time   NA 141 10/02/2017 0833   K 3.7 10/02/2017 0833   CO2 27 10/02/2017 0833   BUN 7.2 10/02/2017 0833   CREATININE 0.7 10/02/2017 0833      Component Value Date/Time   CALCIUM 9.3 10/02/2017 0833   ALKPHOS 51 10/02/2017 0833   AST 15 10/02/2017 0833   ALT 9 10/02/2017 0833   BILITOT 0.81 10/02/2017 0833       Lab Results  Component Value Date   WBC 4.0 10/02/2017   HGB 12.6 10/02/2017   HCT 38.0 10/02/2017   MCV 88.5 10/02/2017   PLT 183 10/02/2017   NEUTROABS 2.8 10/02/2017    ASSESSMENT & PLAN:  Malignant neoplasm of upper-outer quadrant of left breast in female, estrogen receptor positive ([place]) 08/29/2017: Screening detected left breast mass 4 cm at 12 o'clock position, 1.4 cm lymph node the axilla, biopsy of the mass and the lymph nodes were positive for IDC with DCIS grade 3 ER 70% PR 0%, KI 2%, HER-2 negative ratio 1.77, T2N1 stage IIIa clinical stage AJCC 8  Mammaprint high risk luminal  type B.  Average 10-year risk of recurrence untreated 29%; with chemotherapy and hormonal therapy distant metastasis free interval at 5 years 94.6%  09/11/2017 breast MRI: Left breast malignancy with associated linear NME extending posteriorly as well as satellite nodules measures up to 5.3 cm, at least 4 abnormal left axillary lymph nodes  Recommendation: CT scans negative for metastatic disease 1.  Neoadjuvant chemotherapy with dose dense Adriamycin and Cytoxan followed by Taxol x12  2.  Followed by breast conserving surgery and targeted lymph node dissection 3.  Followed by radiation 4.  Follow-up antiestrogen therapy -------------------------------------------------------------------- Current treatment: Cycle 1 day 1 neoadjuvant dose dense Adriamycin and Cytoxan Antiemetics were reviewed Chemotherapy consent obtained Chemotherapy education completed Echocardiogram 09/24/2017: EF 55-60% Closely monitoring for chemotherapy toxicities. Return to clinic in one week for toxicity check  I spent 25 minutes talking to the patient of which more than half was spent in counseling and coordination of care.  No orders of the defined types were placed in this encounter.  The patient has a good understanding of the overall plan. she agrees with it. she  will call with any problems that may develop before the next visit here.   Rulon Eisenmenger, MD 10/02/17

## 2017-10-02 NOTE — Assessment & Plan Note (Signed)
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  Average 10-year risk of recurrence untreated 29%; with chemotherapy and hormonal therapy distant metastasis free interval at 5 years 94.6%  09/11/2017 breast MRI: Left breast malignancy with associated linear NME extending posteriorly as well as satellite nodules measures up to 5.3 cm, at least 4 abnormal left axillary lymph nodes  Recommendation: CT scans negative for metastatic disease 1.  Neoadjuvant chemotherapy with dose dense Adriamycin and Cytoxan followed by Taxol x12  2.  Followed by breast conserving surgery and targeted lymph node dissection 3.  Followed by radiation 4.  Follow-up antiestrogen therapy -------------------------------------------------------------------- Current treatment: Cycle 1 day 1 neoadjuvant dose dense Adriamycin and Cytoxan Antiemetics were reviewed Chemotherapy consent obtained Chemotherapy education completed Echocardiogram 09/24/2017: EF 55-60% Closely monitoring for chemotherapy toxicities. Return to clinic in one week for toxicity check     

## 2017-10-02 NOTE — Patient Instructions (Addendum)
[facility] Discharge Instructions for Patients Receiving Chemotherapy  Today you received the following chemotherapy agents Adriamycin, Cytoxan  To help prevent nausea and vomiting after your treatment, we encourage you to take your nausea medication as prescribed.   If you develop nausea and vomiting that is not controlled by your nausea medication, call the clinic.   BELOW ARE SYMPTOMS THAT SHOULD BE REPORTED IMMEDIATELY:  *FEVER GREATER THAN 100.5 F  *CHILLS WITH OR WITHOUT FEVER  NAUSEA AND VOMITING THAT IS NOT CONTROLLED WITH YOUR NAUSEA MEDICATION  *UNUSUAL SHORTNESS OF BREATH  *UNUSUAL BRUISING OR BLEEDING  TENDERNESS IN MOUTH AND THROAT WITH OR WITHOUT PRESENCE OF ULCERS  *URINARY PROBLEMS  *BOWEL PROBLEMS  UNUSUAL RASH Items with * indicate a potential emergency and should be followed up as soon as possible.  Feel free to call the clinic should you have any questions or concerns. The clinic phone number is [phone].  Please show the [facility] at check-in to the Emergency Department and triage nurse.  Pegfilgrastim injection Neulasta On Pro What is this medicine? PEGFILGRASTIM (PEG fil gra stim) is a long-acting granulocyte colony-stimulating factor that stimulates the growth of neutrophils, a type of white blood cell important in the body's fight against infection. It is used to reduce the incidence of fever and infection in patients with certain types of cancer who are receiving chemotherapy that affects the bone marrow, and to increase survival after being exposed to high doses of radiation. This medicine may be used for other purposes; ask your health care provider or pharmacist if you have questions. COMMON BRAND NAME(S): Neulasta What should I tell my health care provider before I take this medicine? They need to know if you have any of these conditions: -kidney disease -latex allergy -ongoing radiation therapy -sickle  cell disease -skin reactions to acrylic adhesives (On-Body Injector only) -an unusual or allergic reaction to pegfilgrastim, filgrastim, other medicines, foods, dyes, or preservatives -pregnant or trying to get pregnant -breast-feeding How should I use this medicine? This medicine is for injection under the skin. If you get this medicine at home, you will be taught how to prepare and give the pre-filled syringe or how to use the On-body Injector. Refer to the patient Instructions for Use for detailed instructions. Use exactly as directed. Tell your healthcare provider immediately if you suspect that the On-body Injector may not have performed as intended or if you suspect the use of the On-body Injector resulted in a missed or partial dose. It is important that you put your used needles and syringes in a special sharps container. Do not put them in a trash can. If you do not have a sharps container, call your pharmacist or healthcare provider to get one. Talk to your pediatrician regarding the use of this medicine in children. While this drug may be prescribed for selected conditions, precautions do apply. Overdosage: If you think you have taken too much of this medicine contact a poison control center or emergency room at once. NOTE: This medicine is only for you. Do not share this medicine with others. What if I miss a dose? It is important not to miss your dose. Call your doctor or health care professional if you miss your dose. If you miss a dose due to an On-body Injector failure or leakage, a new dose should be administered as soon as possible using a single prefilled syringe for manual use. What may interact with this  medicine? Interactions have not been studied. Give your health care provider a list of all the medicines, herbs, non-prescription drugs, or dietary supplements you use. Also tell them if you smoke, drink alcohol, or use illegal drugs. Some items may interact with your  medicine. This list may not describe all possible interactions. Give your health care provider a list of all the medicines, herbs, non-prescription drugs, or dietary supplements you use. Also tell them if you smoke, drink alcohol, or use illegal drugs. Some items may interact with your medicine. What should I watch for while using this medicine? You may need blood work done while you are taking this medicine. If you are going to need a MRI, CT scan, or other procedure, tell your doctor that you are using this medicine (On-Body Injector only). What side effects may I notice from receiving this medicine? Side effects that you should report to your doctor or health care professional as soon as possible: -allergic reactions like skin rash, itching or hives, swelling of the face, lips, or tongue -dizziness -fever -pain, redness, or irritation at site where injected -pinpoint red spots on the skin -red or dark-brown urine -shortness of breath or breathing problems -stomach or side pain, or pain at the shoulder -swelling -tiredness -trouble passing urine or change in the amount of urine Side effects that usually do not require medical attention (report to your doctor or health care professional if they continue or are bothersome): -bone pain -muscle pain This list may not describe all possible side effects. Call your doctor for medical advice about side effects. You may report side effects to FDA at 1-800-FDA-1088. Where should I keep my medicine? Keep out of the reach of children. Store pre-filled syringes in a refrigerator between 2 and 8 degrees C (36 and 46 degrees F). Do not freeze. Keep in carton to protect from light. Throw away this medicine if it is left out of the refrigerator for more than 48 hours. Throw away any unused medicine after the expiration date. NOTE: This sheet is a summary. It may not cover all possible information. If you have questions about this medicine, talk to your  doctor, pharmacist, or health care provider.  2018 Elsevier/Gold Standard (2016-10-04 12:58:03) Doxorubicin injection- Ardriamycin What is this medicine? DOXORUBICIN (dox oh ROO bi sin) is a chemotherapy drug. It is used to treat many kinds of cancer like leukemia, lymphoma, neuroblastoma, sarcoma, and Wilms' tumor. It is also used to treat bladder cancer, breast cancer, lung cancer, ovarian cancer, stomach cancer, and thyroid cancer. This medicine may be used for other purposes; ask your health care provider or pharmacist if you have questions. COMMON BRAND NAME(S): Adriamycin, Adriamycin PFS, Adriamycin RDF, Rubex What should I tell my health care provider before I take this medicine? They need to know if you have any of these conditions: -heart disease -history of low blood counts caused by a medicine -liver disease -recent or ongoing radiation therapy -an unusual or allergic reaction to doxorubicin, other chemotherapy agents, other medicines, foods, dyes, or preservatives -pregnant or trying to get pregnant -breast-feeding How should I use this medicine? This drug is given as an infusion into a vein. It is administered in a hospital or clinic by a specially trained health care professional. If you have pain, swelling, burning or any unusual feeling around the site of your injection, tell your health care professional right away. Talk to your pediatrician regarding the use of this medicine in children. Special care may be needed. Overdosage:  If you think you have taken too much of this medicine contact a poison control center or emergency room at once. NOTE: This medicine is only for you. Do not share this medicine with others. What if I miss a dose? It is important not to miss your dose. Call your doctor or health care professional if you are unable to keep an appointment. What may interact with this medicine? This medicine may interact with the following  medications: -6-mercaptopurine -paclitaxel -phenytoin -St. John's Wort -trastuzumab -verapamil This list may not describe all possible interactions. Give your health care provider a list of all the medicines, herbs, non-prescription drugs, or dietary supplements you use. Also tell them if you smoke, drink alcohol, or use illegal drugs. Some items may interact with your medicine. What should I watch for while using this medicine? This drug may make you feel generally unwell. This is not uncommon, as chemotherapy can affect healthy cells as well as cancer cells. Report any side effects. Continue your course of treatment even though you feel ill unless your doctor tells you to stop. There is a maximum amount of this medicine you should receive throughout your life. The amount depends on the medical condition being treated and your overall health. Your doctor will watch how much of this medicine you receive in your lifetime. Tell your doctor if you have taken this medicine before. You may need blood work done while you are taking this medicine. Your urine may turn red for a few days after your dose. This is not blood. If your urine is dark or brown, call your doctor. In some cases, you may be given additional medicines to help with side effects. Follow all directions for their use. Call your doctor or health care professional for advice if you get a fever, chills or sore throat, or other symptoms of a cold or flu. Do not treat yourself. This drug decreases your body's ability to fight infections. Try to avoid being around people who are sick. This medicine may increase your risk to bruise or bleed. Call your doctor or health care professional if you notice any unusual bleeding. Talk to your doctor about your risk of cancer. You may be more at risk for certain types of cancers if you take this medicine. Do not become pregnant while taking this medicine or for 6 months after stopping it. Women should  inform their doctor if they wish to become pregnant or think they might be pregnant. Men should not father a child while taking this medicine and for 6 months after stopping it. There is a potential for serious side effects to an unborn child. Talk to your health care professional or pharmacist for more information. Do not breast-feed an infant while taking this medicine. This medicine has caused ovarian failure in some women and reduced sperm counts in some men This medicine may interfere with the ability to have a child. Talk with your doctor or health care professional if you are concerned about your fertility. What side effects may I notice from receiving this medicine? Side effects that you should report to your doctor or health care professional as soon as possible: -allergic reactions like skin rash, itching or hives, swelling of the face, lips, or tongue -breathing problems -chest pain -fast or irregular heartbeat -low blood counts - this medicine may decrease the number of white blood cells, red blood cells and platelets. You may be at increased risk for infections and bleeding. -pain, redness, or irritation at site  where injected -signs of infection - fever or chills, cough, sore throat, pain or difficulty passing urine -signs of decreased platelets or bleeding - bruising, pinpoint red spots on the skin, black, tarry stools, blood in the urine -swelling of the ankles, feet, hands -tiredness -weakness Side effects that usually do not require medical attention (report to your doctor or health care professional if they continue or are bothersome): -diarrhea -hair loss -mouth sores -nail discoloration or damage -nausea -red colored urine -vomiting This list may not describe all possible side effects. Call your doctor for medical advice about side effects. You may report side effects to FDA at 1-800-FDA-1088. Where should I keep my medicine? This drug is given in a hospital or clinic  and will not be stored at home. NOTE: This sheet is a summary. It may not cover all possible information. If you have questions about this medicine, talk to your doctor, pharmacist, or health care provider.  2018 Elsevier/Gold Standard (2015-12-05 11:28:51)  Cyclophosphamide injection- Cytoxan What is this medicine? CYCLOPHOSPHAMIDE (sye kloe FOSS fa mide) is a chemotherapy drug. It slows the growth of cancer cells. This medicine is used to treat many types of cancer like lymphoma, myeloma, leukemia, breast cancer, and ovarian cancer, to name a few. This medicine may be used for other purposes; ask your health care provider or pharmacist if you have questions. COMMON BRAND NAME(S): Cytoxan, Neosar What should I tell my health care provider before I take this medicine? They need to know if you have any of these conditions: -blood disorders -history of other chemotherapy -infection -kidney disease -liver disease -recent or ongoing radiation therapy -tumors in the bone marrow -an unusual or allergic reaction to cyclophosphamide, other chemotherapy, other medicines, foods, dyes, or preservatives -pregnant or trying to get pregnant -breast-feeding How should I use this medicine? This drug is usually given as an injection into a vein or muscle or by infusion into a vein. It is administered in a hospital or clinic by a specially trained health care professional. Talk to your pediatrician regarding the use of this medicine in children. Special care may be needed. Overdosage: If you think you have taken too much of this medicine contact a poison control center or emergency room at once. NOTE: This medicine is only for you. Do not share this medicine with others. What if I miss a dose? It is important not to miss your dose. Call your doctor or health care professional if you are unable to keep an appointment. What may interact with this medicine? This medicine may interact with the following  medications: -amiodarone -amphotericin B -azathioprine -certain antiviral medicines for HIV or AIDS such as protease inhibitors (e.g., indinavir, ritonavir) and zidovudine -certain blood pressure medications such as benazepril, captopril, enalapril, fosinopril, lisinopril, moexipril, monopril, perindopril, quinapril, ramipril, trandolapril -certain cancer medications such as anthracyclines (e.g., daunorubicin, doxorubicin), busulfan, cytarabine, paclitaxel, pentostatin, tamoxifen, trastuzumab -certain diuretics such as chlorothiazide, chlorthalidone, hydrochlorothiazide, indapamide, metolazone -certain medicines that treat or prevent blood clots like warfarin -certain muscle relaxants such as succinylcholine -cyclosporine -etanercept -indomethacin -medicines to increase blood counts like filgrastim, pegfilgrastim, sargramostim -medicines used as general anesthesia -metronidazole -natalizumab This list may not describe all possible interactions. Give your health care provider a list of all the medicines, herbs, non-prescription drugs, or dietary supplements you use. Also tell them if you smoke, drink alcohol, or use illegal drugs. Some items may interact with your medicine. What should I watch for while using this medicine? Visit your doctor for checks  on your progress. This drug may make you feel generally unwell. This is not uncommon, as chemotherapy can affect healthy cells as well as cancer cells. Report any side effects. Continue your course of treatment even though you feel ill unless your doctor tells you to stop. Drink water or other fluids as directed. Urinate often, even at night. In some cases, you may be given additional medicines to help with side effects. Follow all directions for their use. Call your doctor or health care professional for advice if you get a fever, chills or sore throat, or other symptoms of a cold or flu. Do not treat yourself. This drug decreases your body's  ability to fight infections. Try to avoid being around people who are sick. This medicine may increase your risk to bruise or bleed. Call your doctor or health care professional if you notice any unusual bleeding. Be careful brushing and flossing your teeth or using a toothpick because you may get an infection or bleed more easily. If you have any dental work done, tell your dentist you are receiving this medicine. You may get drowsy or dizzy. Do not drive, use machinery, or do anything that needs mental alertness until you know how this medicine affects you. Do not become pregnant while taking this medicine or for 1 year after stopping it. Women should inform their doctor if they wish to become pregnant or think they might be pregnant. Men should not father a child while taking this medicine and for 4 months after stopping it. There is a potential for serious side effects to an unborn child. Talk to your health care professional or pharmacist for more information. Do not breast-feed an infant while taking this medicine. This medicine may interfere with the ability to have a child. This medicine has caused ovarian failure in some women. This medicine has caused reduced sperm counts in some men. You should talk with your doctor or health care professional if you are concerned about your fertility. If you are going to have surgery, tell your doctor or health care professional that you have taken this medicine. What side effects may I notice from receiving this medicine? Side effects that you should report to your doctor or health care professional as soon as possible: -allergic reactions like skin rash, itching or hives, swelling of the face, lips, or tongue -low blood counts - this medicine may decrease the number of white blood cells, red blood cells and platelets. You may be at increased risk for infections and bleeding. -signs of infection - fever or chills, cough, sore throat, pain or difficulty  passing urine -signs of decreased platelets or bleeding - bruising, pinpoint red spots on the skin, black, tarry stools, blood in the urine -signs of decreased red blood cells - unusually weak or tired, fainting spells, lightheadedness -breathing problems -dark urine -dizziness -palpitations -swelling of the ankles, feet, hands -trouble passing urine or change in the amount of urine -weight gain -yellowing of the eyes or skin Side effects that usually do not require medical attention (report to your doctor or health care professional if they continue or are bothersome): -changes in nail or skin color -hair loss -missed menstrual periods -mouth sores -nausea, vomiting This list may not describe all possible side effects. Call your doctor for medical advice about side effects. You may report side effects to FDA at 1-800-FDA-1088. Where should I keep my medicine? This drug is given in a hospital or clinic and will not be stored at home.  NOTE: This sheet is a summary. It may not cover all possible information. If you have questions about this medicine, talk to your doctor, pharmacist, or health care provider.  2018 Elsevier/Gold Standard (2012-08-22 16:22:58)

## 2017-10-03 ENCOUNTER — Telehealth: Payer: Self-pay | Admitting: Hematology and Oncology

## 2017-10-03 NOTE — Telephone Encounter (Signed)
10/03/17 successfully faxed FMLA paperwork to [facility] @ [phone] @ 12:22 pm.  Called patient @ [phone] left message informing patient paperwork has been sent and if she'd like a copy for her personal records to please give me a call back at [phone]

## 2017-10-08 NOTE — Assessment & Plan Note (Signed)
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  Average 10-year risk of recurrence untreated 29%; with chemotherapy and hormonal therapy distant metastasis free interval at 5 years 94.6%  09/11/2017 breast MRI: Left breast malignancy with associated linear NME extending posteriorly as well as satellite nodules measures up to 5.3 cm, at least 4 abnormal left axillary lymph nodes  Recommendation: CT scans negative for metastatic disease 1.  Neoadjuvant chemotherapy with dose dense Adriamycin and Cytoxan followed by Taxol x12  2.  Followed by breast conserving surgery and targeted lymph node dissection 3.  Followed by radiation 4.  Follow-up antiestrogen therapy -------------------------------------------------------------------- Current treatment: Cycle 1 day 8 neoadjuvant dose dense Adriamycin and Cytoxan Echocardiogram 09/24/2017: EF 55-60% Closely monitoring for chemotherapy toxicities.  Chemo Toxicities:  Return to clinic in one week for cycle 2

## 2017-10-09 ENCOUNTER — Other Ambulatory Visit: Payer: Self-pay

## 2017-10-09 ENCOUNTER — Ambulatory Visit (HOSPITAL_BASED_OUTPATIENT_CLINIC_OR_DEPARTMENT_OTHER): Payer: 59 | Admitting: Hematology and Oncology

## 2017-10-09 ENCOUNTER — Other Ambulatory Visit (HOSPITAL_BASED_OUTPATIENT_CLINIC_OR_DEPARTMENT_OTHER): Payer: 59

## 2017-10-09 DIAGNOSIS — C50412 Malignant neoplasm of upper-outer quadrant of left female breast: Secondary | ICD-10-CM

## 2017-10-09 DIAGNOSIS — R110 Nausea: Secondary | ICD-10-CM

## 2017-10-09 DIAGNOSIS — D6959 Other secondary thrombocytopenia: Secondary | ICD-10-CM

## 2017-10-09 DIAGNOSIS — C773 Secondary and unspecified malignant neoplasm of axilla and upper limb lymph nodes: Secondary | ICD-10-CM

## 2017-10-09 DIAGNOSIS — Z170 Estrogen receptor positive status [ER+]: Principal | ICD-10-CM

## 2017-10-09 DIAGNOSIS — E860 Dehydration: Secondary | ICD-10-CM

## 2017-10-09 DIAGNOSIS — R530 Neoplastic (malignant) related fatigue: Secondary | ICD-10-CM | POA: Diagnosis not present

## 2017-10-09 LAB — CBC WITH DIFFERENTIAL/PLATELET
BASO%: 2.4 % — AB (ref 0.0–2.0)
Basophils Absolute: 0 10*3/uL (ref 0.0–0.1)
EOS%: 7.3 % — AB (ref 0.0–7.0)
Eosinophils Absolute: 0 10*3/uL (ref 0.0–0.5)
HEMATOCRIT: 37 % (ref 34.8–46.6)
HEMOGLOBIN: 12.5 g/dL (ref 11.6–15.9)
LYMPH#: 0.4 10*3/uL — AB (ref 0.9–3.3)
LYMPH%: 85.4 % — AB (ref 14.0–49.7)
MCH: 29.7 pg (ref 25.1–34.0)
MCHC: 33.8 g/dL (ref 31.5–36.0)
MCV: 87.9 fL (ref 79.5–101.0)
MONO#: 0 10*3/uL — AB (ref 0.1–0.9)
MONO%: 2.4 % (ref 0.0–14.0)
NEUT%: 2.5 % — ABNORMAL LOW (ref 38.4–76.8)
NEUTROS ABS: 0 10*3/uL — AB (ref 1.5–6.5)
Platelets: 74 10*3/uL — ABNORMAL LOW (ref 145–400)
RBC: 4.21 10*6/uL (ref 3.70–5.45)
RDW: 13 % (ref 11.2–14.5)
WBC: 0.4 10*3/uL — AB (ref 3.9–10.3)
nRBC: 0 % (ref 0–0)

## 2017-10-09 LAB — COMPREHENSIVE METABOLIC PANEL
ALT: 14 U/L (ref 0–55)
AST: 11 U/L (ref 5–34)
Albumin: 4 g/dL (ref 3.5–5.0)
Alkaline Phosphatase: 59 U/L (ref 40–150)
Anion Gap: 7 mEq/L (ref 3–11)
BUN: 13.5 mg/dL (ref 7.0–26.0)
CALCIUM: 9.2 mg/dL (ref 8.4–10.4)
CHLORIDE: 100 meq/L (ref 98–109)
CO2: 27 meq/L (ref 22–29)
CREATININE: 0.8 mg/dL (ref 0.6–1.1)
EGFR: >60 mL/min/{1.73_m2} (ref >60)
GLUCOSE: 93 mg/dL (ref 70–140)
POTASSIUM: 4.2 meq/L (ref 3.5–5.1)
SODIUM: 134 meq/L — AB (ref 136–145)
Total Bilirubin: 1.04 mg/dL (ref 0.20–1.20)
Total Protein: 7 g/dL (ref 6.4–8.3)

## 2017-10-09 MED ORDER — SODIUM CHLORIDE 0.9 % IV SOLN
Freq: Once | INTRAVENOUS | Status: AC
  Administered 2017-10-09: 10:00:00 via INTRAVENOUS

## 2017-10-09 MED ORDER — SODIUM CHLORIDE 0.9 % IV SOLN: Freq: Once | INTRAVENOUS | Status: DC

## 2017-10-09 MED ORDER — ONDANSETRON HCL 4 MG/2ML IJ SOLN
INTRAMUSCULAR | Status: AC
  Filled 2017-10-09: qty 4

## 2017-10-09 MED ORDER — ONDANSETRON HCL 4 MG/2ML IJ SOLN
8.0000 mg | Freq: Once | INTRAMUSCULAR | Status: AC
  Administered 2017-10-09: 8 mg via INTRAVENOUS

## 2017-10-09 NOTE — Patient Instructions (Signed)
Dehydration, Adult Dehydration is when there is not enough fluid or water in your body. This happens when you lose more fluids than you take in. Dehydration can range from mild to very bad. It should be treated right away to keep it from getting very bad. Symptoms of mild dehydration may include:  Thirst.  Dry lips.  Slightly dry mouth.  Dry, warm skin.  Dizziness. Symptoms of moderate dehydration may include:  Very dry mouth.  Muscle cramps.  Dark pee (urine). Pee may be the color of tea.  Your body making less pee.  Your eyes making fewer tears.  Heartbeat that is uneven or faster than normal (palpitations).  Headache.  Light-headedness, especially when you stand up from sitting.  Fainting (syncope). Symptoms of very bad dehydration may include:  Changes in skin, such as: ? Cold and clammy skin. ? Blotchy (mottled) or pale skin. ? Skin that does not quickly return to normal after being lightly pinched and let go (poor skin turgor).  Changes in body fluids, such as: ? Feeling very thirsty. ? Your eyes making fewer tears. ? Not sweating when body temperature is high, such as in hot weather. ? Your body making very little pee.  Changes in vital signs, such as: ? Weak pulse. ? Pulse that is more than 100 beats a minute when you are sitting still. ? Fast breathing. ? Low blood pressure.  Other changes, such as: ? Sunken eyes. ? Cold hands and feet. ? Confusion. ? Lack of energy (lethargy). ? Trouble waking up from sleep. ? Short-term weight loss. ? Unconsciousness. Follow these instructions at home:  If told by your doctor, drink an ORS: ? Make an ORS by using instructions on the package. ? Start by drinking small amounts, about  cup (120 mL) every 5-10 minutes. ? Slowly drink more until you have had the amount that your doctor said to have.  Drink enough clear fluid to keep your pee clear or pale yellow. If you were told to drink an ORS, finish the ORS  first, then start slowly drinking clear fluids. Drink fluids such as: ? Water. Do not drink only water by itself. Doing that can make the salt (sodium) level in your body get too low (hyponatremia). ? Ice chips. ? Fruit juice that you have added water to (diluted). ? Low-calorie sports drinks.  Avoid: ? Alcohol. ? Drinks that have a lot of sugar. These include high-calorie sports drinks, fruit juice that does not have water added, and soda. ? Caffeine. ? Foods that are greasy or have a lot of fat or sugar.  Take over-the-counter and prescription medicines only as told by your doctor.  Do not take salt tablets. Doing that can make the salt level in your body get too high (hypernatremia).  Eat foods that have minerals (electrolytes). Examples include bananas, oranges, potatoes, tomatoes, and spinach.  Keep all follow-up visits as told by your doctor. This is important. Contact a doctor if:  You have belly (abdominal) pain that: ? Gets worse. ? Stays in one area (localizes).  You have a rash.  You have a stiff neck.  You get angry or annoyed more easily than normal (irritability).  You are more sleepy than normal.  You have a harder time waking up than normal.  You feel: ? Weak. ? Dizzy. ? Very thirsty.  You have peed (urinated) only a small amount of very dark pee during 6-8 hours. Get help right away if:  You have symptoms of   very bad dehydration.  You cannot drink fluids without throwing up (vomiting).  Your symptoms get worse with treatment.  You have a fever.  You have a very bad headache.  You are throwing up or having watery poop (diarrhea) and it: ? Gets worse. ? Does not go away.  You have blood or something green (bile) in your throw-up.  You have blood in your poop (stool). This may cause poop to look black and tarry.  You have not peed in 6-8 hours.  You pass out (faint).  Your heart rate when you are sitting still is more than 100 beats a  minute.  You have trouble breathing. This information is not intended to replace advice given to you by your health care provider. Make sure you discuss any questions you have with your health care provider. Document Released: 08/04/2009 Document Revised: 04/27/2016 Document Reviewed: 12/02/2015 Elsevier Interactive Patient Education  2018 [employer]  

## 2017-10-09 NOTE — Progress Notes (Signed)
Patient Care Team: [name], MD as PCP - General (Family Medicine)  DIAGNOSIS:  Encounter Diagnosis  Name Primary?  . Malignant neoplasm of upper-outer quadrant of left breast in female, estrogen receptor positive ([place])     SUMMARY OF ONCOLOGIC HISTORY:   Malignant neoplasm of upper-outer quadrant of left breast in female, estrogen receptor positive ([place])   08/29/2017 Initial Diagnosis    Screening detected left breast mass 4 cm at 12 o'clock position, 1.4 cm lymph node the axilla, biopsy of the mass and the lymph nodes were positive for IDC with DCIS grade 3 ER 70% PR 0%, KI 2%, HER-2 negative ratio 1.77, T2N1 stage IIIa clinical stage AJCC 8      09/11/2017 Breast MRI    Left breast malignancy with associated linear NME extending posteriorly as well as satellite nodules measures up to 5.3 cm, at least 4 abnormal left axillary lymph nodes      09/11/2017 Imaging    CT CAP and Bone scan negative for metastatic disease      09/18/2017 Miscellaneous    Mammaprint high risk luminal type B.  Average 10-year risk of recurrence untreated 29%; with chemotherapy and hormonal therapy distant metastasis free interval at 5 years 94.6%      10/02/2017 -  Neo-Adjuvant Chemotherapy    Neoadjuvant chemotherapy with dose dense Adriamycin and Cytoxan x4 followed by Taxol weekly x12       CHIEF COMPLIANT: Cycle 1 day 8 dose dense Adriamycin and Cytoxan  INTERVAL HISTORY: Tonya Jackson is a 51 year old with above-mentioned history of left breast cancer currently on neoadjuvant chemotherapy today is cycle 1 day 8 of her dose dense Adriamycin and Cytoxan.  Patient had profound nausea for 4-5 days after chemotherapy.  Because that she was not eating or drinking much.  She had lost 4 pounds since last week.  She feels weak and has had difficulty at work as well.  Her voice was also giving away.  She tells me the Claritin made her nauseated.  However she has not taken her nausea medications as  prescribed.  REVIEW OF SYSTEMS:   Constitutional: Denies fevers, chills or abnormal weight loss Eyes: Denies blurriness of vision Ears, nose, mouth, throat, and face: Denies mucositis or sore throat Respiratory: Denies cough, dyspnea or wheezes Cardiovascular: Denies palpitation, chest discomfort Gastrointestinal: Profound nausea Skin: Denies abnormal skin rashes Lymphatics: Denies new lymphadenopathy or easy bruising Neurological:Denies numbness, tingling or new weaknesses Behavioral/Psych: Mood is stable, no new changes  Extremities: No lower extremity edema All other systems were reviewed with the patient and are negative.  I have reviewed the past medical history, past surgical history, social history and family history with the patient and they are unchanged from previous note.  ALLERGIES:  is allergic to other.  MEDICATIONS:  Current Outpatient Medications  Medication Sig Dispense Refill  . HYDROcodone-acetaminophen (NORCO/VICODIN) 5-325 MG tablet Take 1-2 tablets by mouth every 6 (six) hours as needed for moderate pain or severe pain. 15 tablet 0  . lidocaine-prilocaine (EMLA) cream Apply to affected area once 30 g 3  . LORazepam (ATIVAN) 0.5 MG tablet Take 1 tablet (0.5 mg total) by mouth at bedtime as needed (Nausea or vomiting). 30 tablet 0  . ondansetron (ZOFRAN) 8 MG tablet Take 1 tablet (8 mg total) by mouth 2 (two) times daily as needed. Start on the third day after chemotherapy. 30 tablet 1  . prochlorperazine (COMPAZINE) 10 MG tablet Take 1 tablet (10 mg total) by mouth  every 6 (six) hours as needed (Nausea or vomiting). 30 tablet 1   No current facility-administered medications for this visit.     PHYSICAL EXAMINATION: ECOG PERFORMANCE STATUS: 1 - Symptomatic but completely ambulatory  Vitals:   10/09/17 0829  BP: 109/69  Pulse: 93  Resp: 18  Temp: 98.4 F (36.9 C)  SpO2: 100%   Filed Weights   10/09/17 0829  Weight: 129 lb 9.6 oz (58.8 kg)     GENERAL:alert, no distress and comfortable SKIN: skin color, texture, turgor are normal, no rashes or significant lesions EYES: normal, Conjunctiva are pink and non-injected, sclera clear OROPHARYNX:no exudate, no erythema and lips, buccal mucosa, and tongue normal  NECK: supple, thyroid normal size, non-tender, without nodularity LYMPH:  no palpable lymphadenopathy in the cervical, axillary or inguinal LUNGS: clear to auscultation and percussion with normal breathing effort HEART: regular rate & rhythm and no murmurs and no lower extremity edema ABDOMEN:abdomen soft, non-tender and normal bowel sounds MUSCULOSKELETAL:no cyanosis of digits and no clubbing  NEURO: alert & oriented x 3 with fluent speech, no focal motor/sensory deficits EXTREMITIES: No lower extremity edema  LABORATORY DATA:  I have reviewed the data as listed   Chemistry      Component Value Date/Time   NA 134 (L) 10/09/2017 0812   K 4.2 10/09/2017 0812   CO2 27 10/09/2017 0812   BUN 13.5 10/09/2017 0812   CREATININE 0.8 10/09/2017 0812      Component Value Date/Time   CALCIUM 9.2 10/09/2017 0812   ALKPHOS 59 10/09/2017 0812   AST 11 10/09/2017 0812   ALT 14 10/09/2017 0812   BILITOT 1.04 10/09/2017 0812       Lab Results  Component Value Date   WBC 0.4 (LL) 10/09/2017   HGB 12.5 10/09/2017   HCT 37.0 10/09/2017   MCV 87.9 10/09/2017   PLT 74 (L) 10/09/2017   NEUTROABS 0.0 (LL) 10/09/2017    ASSESSMENT & PLAN:  Malignant neoplasm of upper-outer quadrant of left breast in female, estrogen receptor positive ([place]) 08/29/2017: Screening detected left breast mass 4 cm at 12 o'clock position, 1.4 cm lymph node the axilla, biopsy of the mass and the lymph nodes were positive for IDC with DCIS grade 3 ER 70% PR 0%, KI 2%, HER-2 negative ratio 1.77, T2N1 stage IIIa clinical stage AJCC 8  Mammaprint high risk luminal type B.  Average 10-year risk of recurrence untreated 29%; with chemotherapy and hormonal  therapy distant metastasis free interval at 5 years 94.6%  09/11/2017 breast MRI: Left breast malignancy with associated linear NME extending posteriorly as well as satellite nodules measures up to 5.3 cm, at least 4 abnormal left axillary lymph nodes  Recommendation: CT scans negative for metastatic disease 1.  Neoadjuvant chemotherapy with dose dense Adriamycin and Cytoxan followed by Taxol x12  2.  Followed by breast conserving surgery and targeted lymph node dissection 3.  Followed by radiation 4.  Follow-up antiestrogen therapy -------------------------------------------------------------------- Current treatment: Cycle 1 day 8 neoadjuvant dose dense Adriamycin and Cytoxan Echocardiogram 09/24/2017: EF 55-60% Closely monitoring for chemotherapy toxicities.  Chemo Toxicities: 1. Nausea for 5 days after chemotherapy 2. we will reduce the chemotherapy with cycle 2 because her ANC today is 0. 3.  Severe fatigue related to chemotherapy 4.  Thrombocytopenia due to chemotherapy   Clinical dehydration: We will administer 1 L of normal saline with 8 mg of Zofran IV. Return to clinic in one week for cycle 2  I spent 25 minutes talking to  the patient of which more than half was spent in counseling and coordination of care.  No orders of the defined types were placed in this encounter.  The patient has a good understanding of the overall plan. she agrees with it. she will call with any problems that may develop before the next visit here.   Rulon Eisenmenger, MD 10/09/17

## 2017-10-09 NOTE — Addendum Note (Signed)
Addended by: Tora Kindred on: 10/09/2017 09:20 AM   Modules accepted: Orders

## 2017-10-14 ENCOUNTER — Telehealth: Payer: Self-pay | Admitting: Hematology and Oncology

## 2017-10-14 NOTE — Telephone Encounter (Signed)
No 12/19 los. °

## 2017-10-15 NOTE — Assessment & Plan Note (Signed)
08/29/2017: Screening detected left breast mass 4 cm at 12 o'clock position, 1.4 cm lymph node the axilla, biopsy of the mass and the lymph nodes were positive for IDC with DCIS grade 3 ER 70% PR 0%, KI 2%, HER-2 negative ratio 1.77, T2N1 stage IIIa clinical stage AJCC 8  Mammaprinthigh risk luminal type B. Average 10-year risk of recurrence untreated 29%; with chemotherapy and hormonal therapy distant metastasis free interval at 5 years 94.6%  09/11/2017 breast [id number] breast malignancy with associated linear NME extending posteriorly as well as satellite nodules measures up to 5.3 cm, at least 4 abnormal left axillary lymph nodes  Recommendation: CT scans negative for metastatic disease 1.Neoadjuvant chemotherapy with dose dense Adriamycin and Cytoxan followed by Taxol x12 2. Followed by breast conserving surgery and targeted lymph node dissection 3. Followed by radiation 4. Follow-up antiestrogen therapy -------------------------------------------------------------------- Current treatment: Cycle 2 day 1 neoadjuvant dose dense Adriamycin and Cytoxan Echocardiogram 09/24/2017: EF 55-60% Closely monitoring for chemotherapy toxicities.  Chemo Toxicities: 1. Nausea for 5 days after chemotherapy 2. we reduced the chemotherapy with cycle 2  3.  Severe fatigue related to chemotherapy 4.  Thrombocytopenia due to chemotherapy    Return to clinic in 2 weeks for cycle 3

## 2017-10-16 ENCOUNTER — Ambulatory Visit: Payer: 59

## 2017-10-16 ENCOUNTER — Ambulatory Visit (HOSPITAL_BASED_OUTPATIENT_CLINIC_OR_DEPARTMENT_OTHER): Payer: 59 | Admitting: Hematology and Oncology

## 2017-10-16 ENCOUNTER — Encounter: Payer: Self-pay | Admitting: *Deleted

## 2017-10-16 ENCOUNTER — Ambulatory Visit (HOSPITAL_BASED_OUTPATIENT_CLINIC_OR_DEPARTMENT_OTHER): Payer: 59

## 2017-10-16 ENCOUNTER — Encounter: Payer: Self-pay | Admitting: Hematology and Oncology

## 2017-10-16 ENCOUNTER — Telehealth: Payer: Self-pay | Admitting: Hematology and Oncology

## 2017-10-16 DIAGNOSIS — C50412 Malignant neoplasm of upper-outer quadrant of left female breast: Secondary | ICD-10-CM

## 2017-10-16 DIAGNOSIS — Z5111 Encounter for antineoplastic chemotherapy: Secondary | ICD-10-CM

## 2017-10-16 DIAGNOSIS — Z170 Estrogen receptor positive status [ER+]: Secondary | ICD-10-CM

## 2017-10-16 DIAGNOSIS — Z5189 Encounter for other specified aftercare: Secondary | ICD-10-CM

## 2017-10-16 DIAGNOSIS — Z95828 Presence of other vascular implants and grafts: Secondary | ICD-10-CM | POA: Insufficient documentation

## 2017-10-16 LAB — COMPREHENSIVE METABOLIC PANEL
ALBUMIN: 3.9 g/dL (ref 3.5–5.0)
ALK PHOS: 60 U/L (ref 40–150)
ALT: 11 U/L (ref 0–55)
ANION GAP: 9 meq/L (ref 3–11)
AST: 14 U/L (ref 5–34)
BILIRUBIN TOTAL: 0.29 mg/dL (ref 0.20–1.20)
BUN: 7.3 mg/dL (ref 7.0–26.0)
CALCIUM: 9 mg/dL (ref 8.4–10.4)
CHLORIDE: 105 meq/L (ref 98–109)
CO2: 26 mEq/L (ref 22–29)
CREATININE: 0.7 mg/dL (ref 0.6–1.1)
EGFR: >60 mL/min/{1.73_m2} (ref >60)
Glucose: 90 mg/dl (ref 70–140)
Potassium: 3.9 mEq/L (ref 3.5–5.1)
Sodium: 140 mEq/L (ref 136–145)
Total Protein: 6.8 g/dL (ref 6.4–8.3)

## 2017-10-16 LAB — CBC WITH DIFFERENTIAL/PLATELET
BASO%: 0.4 % (ref 0.0–2.0)
Basophils Absolute: 0 10*3/uL (ref 0.0–0.1)
EOS%: 0 % (ref 0.0–7.0)
Eosinophils Absolute: 0 10*3/uL (ref 0.0–0.5)
HCT: 35 % (ref 34.8–46.6)
HEMOGLOBIN: 11.5 g/dL — AB (ref 11.6–15.9)
LYMPH%: 26.4 % (ref 14.0–49.7)
MCH: 29.3 pg (ref 25.1–34.0)
MCHC: 32.9 g/dL (ref 31.5–36.0)
MCV: 89.3 fL (ref 79.5–101.0)
MONO#: 0.6 10*3/uL (ref 0.1–0.9)
MONO%: 13.4 % (ref 0.0–14.0)
NEUT%: 59.8 % (ref 38.4–76.8)
NEUTROS ABS: 2.7 10*3/uL (ref 1.5–6.5)
Platelets: 269 10*3/uL (ref 145–400)
RBC: 3.92 10*6/uL (ref 3.70–5.45)
RDW: 13.3 % (ref 11.2–14.5)
WBC: 4.5 10*3/uL (ref 3.9–10.3)
lymph#: 1.2 10*3/uL (ref 0.9–3.3)

## 2017-10-16 MED ORDER — SODIUM CHLORIDE 0.9% FLUSH
10.0000 mL | INTRAVENOUS | Status: DC | PRN
  Administered 2017-10-16: 10 mL
  Filled 2017-10-16: qty 10

## 2017-10-16 MED ORDER — PALONOSETRON HCL INJECTION 0.25 MG/5ML
0.2500 mg | Freq: Once | INTRAVENOUS | Status: AC
Start: 2017-10-16 — End: 2017-10-16
  Administered 2017-10-16: 0.25 mg via INTRAVENOUS

## 2017-10-16 MED ORDER — PEGFILGRASTIM 6 MG/0.6ML ~~LOC~~ PSKT
6.0000 mg | PREFILLED_SYRINGE | Freq: Once | SUBCUTANEOUS | Status: AC
  Administered 2017-10-16: 6 mg via SUBCUTANEOUS

## 2017-10-16 MED ORDER — DOXORUBICIN HCL CHEMO IV INJECTION 2 MG/ML
50.0000 mg/m2 | Freq: Once | INTRAVENOUS | Status: AC
  Administered 2017-10-16: 86 mg via INTRAVENOUS
  Filled 2017-10-16: qty 43

## 2017-10-16 MED ORDER — SODIUM CHLORIDE 0.9 % IV SOLN
500.0000 mg/m2 | Freq: Once | INTRAVENOUS | Status: AC
  Administered 2017-10-16: 860 mg via INTRAVENOUS
  Filled 2017-10-16: qty 43

## 2017-10-16 MED ORDER — SODIUM CHLORIDE 0.9 % IV SOLN
Freq: Once | INTRAVENOUS | Status: AC
  Administered 2017-10-16: 12:00:00 via INTRAVENOUS
  Filled 2017-10-16: qty 5

## 2017-10-16 MED ORDER — SODIUM CHLORIDE 0.9% FLUSH
10.0000 mL | Freq: Once | INTRAVENOUS | Status: AC
  Administered 2017-10-16: 10 mL
  Filled 2017-10-16: qty 10

## 2017-10-16 MED ORDER — SODIUM CHLORIDE 0.9 % IV SOLN
Freq: Once | INTRAVENOUS | Status: AC
  Administered 2017-10-16: 11:00:00 via INTRAVENOUS

## 2017-10-16 MED ORDER — PALONOSETRON HCL INJECTION 0.25 MG/5ML
INTRAVENOUS | Status: AC
  Filled 2017-10-16: qty 5

## 2017-10-16 MED ORDER — PEGFILGRASTIM 6 MG/0.6ML ~~LOC~~ PSKT
PREFILLED_SYRINGE | SUBCUTANEOUS | Status: AC
  Filled 2017-10-16: qty 0.6

## 2017-10-16 MED ORDER — HEPARIN SOD (PORK) LOCK FLUSH 100 UNIT/ML IV SOLN
500.0000 [IU] | Freq: Once | INTRAVENOUS | Status: AC | PRN
  Administered 2017-10-16: 500 [IU]
  Filled 2017-10-16: qty 5

## 2017-10-16 NOTE — Assessment & Plan Note (Signed)
08/29/2017: Screening detected left breast mass 4 cm at 12 o'clock position, 1.4 cm lymph node the axilla, biopsy of the mass and the lymph nodes were positive for IDC with DCIS grade 3 ER 70% PR 0%, KI 2%, HER-2 negative ratio 1.77, T2N1 stage IIIa clinical stage AJCC 8  Mammaprinthigh risk luminal type B. Average 10-year risk of recurrence untreated 29%; with chemotherapy and hormonal therapy distant metastasis free interval at 5 years 94.6%  09/11/2017 breast [id number] breast malignancy with associated linear NME extending posteriorly as well as satellite nodules measures up to 5.3 cm, at least 4 abnormal left axillary lymph nodes  Recommendation: CT scans negative for metastatic disease 1.Neoadjuvant chemotherapy with dose dense Adriamycin and Cytoxan followed by Taxol x12 2. Followed by breast conserving surgery and targeted lymph node dissection 3. Followed by radiation 4. Follow-up antiestrogen therapy -------------------------------------------------------------------- Current treatment: Cycle 2 day1neoadjuvant dose dense Adriamycin and Cytoxan Echocardiogram 09/24/2017: EF 55-60% Closely monitoring for chemotherapy toxicities.  Chemo Toxicities: 1.Nausea for 5 days after chemotherapy 2.we reduced the chemotherapy with cycle 2  3.Severe fatigue related to chemotherapy 4.Thrombocytopenia due to chemotherapy   Return to clinic in 2 weeks forcycle  3

## 2017-10-16 NOTE — Progress Notes (Signed)
Met w/ pt to introduce myself as her Arboriculturist and to discuss copay assistance for Neulasta.  Pt would like to apply so I will enroll her online.  I also informed her of the [employer], went over what it covers and gave her an expense sheet.  She will provide proof of income to see if she qualifies.  Pt has my card for any questions or concerns she may have in the future.

## 2017-10-16 NOTE — Patient Instructions (Signed)
[facility] Discharge Instructions for Patients Receiving Chemotherapy  Today you received the following chemotherapy agents: Doxorubicin (Adriamycin) and Cyclophosphamide (Cytoxan)  To help prevent nausea and vomiting after your treatment, we encourage you to take your nausea medication  as prescribed.    If you develop nausea and vomiting that is not controlled by your nausea medication, call the clinic.   BELOW ARE SYMPTOMS THAT SHOULD BE REPORTED IMMEDIATELY:  *FEVER GREATER THAN 100.5 F  *CHILLS WITH OR WITHOUT FEVER  NAUSEA AND VOMITING THAT IS NOT CONTROLLED WITH YOUR NAUSEA MEDICATION  *UNUSUAL SHORTNESS OF BREATH  *UNUSUAL BRUISING OR BLEEDING  TENDERNESS IN MOUTH AND THROAT WITH OR WITHOUT PRESENCE OF ULCERS  *URINARY PROBLEMS  *BOWEL PROBLEMS  UNUSUAL RASH Items with * indicate a potential emergency and should be followed up as soon as possible.  Feel free to call the clinic should you have any questions or concerns. The clinic phone number is [phone].  Please show the CHEMO ALERT CARD at check-in to the Emergency Department and triage nurse.   

## 2017-10-16 NOTE — Progress Notes (Signed)
Patient Care Team: [name], MD as PCP - General (Family Medicine)  DIAGNOSIS:  Encounter Diagnosis  Name Primary?  . Malignant neoplasm of upper-outer quadrant of left breast in female, estrogen receptor positive ([place])     SUMMARY OF ONCOLOGIC HISTORY:   Malignant neoplasm of upper-outer quadrant of left breast in female, estrogen receptor positive ([place])   08/29/2017 Initial Diagnosis    Screening detected left breast mass 4 cm at 12 o'clock position, 1.4 cm lymph node the axilla, biopsy of the mass and the lymph nodes were positive for IDC with DCIS grade 3 ER 70% PR 0%, KI 2%, HER-2 negative ratio 1.77, T2N1 stage IIIa clinical stage AJCC 8      09/11/2017 Breast MRI    Left breast malignancy with associated linear NME extending posteriorly as well as satellite nodules measures up to 5.3 cm, at least 4 abnormal left axillary lymph nodes      09/11/2017 Imaging    CT CAP and Bone scan negative for metastatic disease      09/18/2017 Miscellaneous    Mammaprint high risk luminal type B.  Average 10-year risk of recurrence untreated 29%; with chemotherapy and hormonal therapy distant metastasis free interval at 5 years 94.6%      10/02/2017 -  Neo-Adjuvant Chemotherapy    Neoadjuvant chemotherapy with dose dense Adriamycin and Cytoxan x4 followed by Taxol weekly x12       CHIEF COMPLIANT: Cycle 2-day 1 dose since he remains on Cytoxan  INTERVAL HISTORY: Tonya Jackson is a 51 year old with above-mentioned history of left breast cancer currently neoadjuvant chemotherapy with dose since he remains on Cytoxan.  Today is cycle 2-day 1.  Over the past week her symptoms related to cycle 1 chemotherapy have improved significantly.  She however still had few intermittent problems with stomach upset.  She has been eating and drinking well.  REVIEW OF SYSTEMS:   Constitutional: Denies fevers, chills or abnormal weight loss Eyes: Denies blurriness of vision Ears, nose, mouth,  throat, and face: Denies mucositis or sore throat Respiratory: Denies cough, dyspnea or wheezes Cardiovascular: Denies palpitation, chest discomfort Gastrointestinal: Stomach discomfort Skin: Denies abnormal skin rashes Lymphatics: Denies new lymphadenopathy or easy bruising Neurological:Denies numbness, tingling or new weaknesses Behavioral/Psych: Mood is stable, no new changes  Extremities: No lower extremity edema  All other systems were reviewed with the patient and are negative.  I have reviewed the past medical history, past surgical history, social history and family history with the patient and they are unchanged from previous note.  ALLERGIES:  is allergic to other.  MEDICATIONS:  Current Outpatient Medications  Medication Sig Dispense Refill  . HYDROcodone-acetaminophen (NORCO/VICODIN) 5-325 MG tablet Take 1-2 tablets by mouth every 6 (six) hours as needed for moderate pain or severe pain. 15 tablet 0  . lidocaine-prilocaine (EMLA) cream Apply to affected area once 30 g 3  . LORazepam (ATIVAN) 0.5 MG tablet Take 1 tablet (0.5 mg total) by mouth at bedtime as needed (Nausea or vomiting). 30 tablet 0  . ondansetron (ZOFRAN) 8 MG tablet Take 1 tablet (8 mg total) by mouth 2 (two) times daily as needed. Start on the third day after chemotherapy. 30 tablet 1  . prochlorperazine (COMPAZINE) 10 MG tablet Take 1 tablet (10 mg total) by mouth every 6 (six) hours as needed (Nausea or vomiting). 30 tablet 1   No current facility-administered medications for this visit.    Facility-Administered Medications Ordered in Other Visits  Medication Dose Route  Frequency Provider Last Rate Last Dose  . sodium chloride flush (NS) 0.9 % injection 10 mL  10 mL Intracatheter PRN Nicholas Lose, MD   10 mL at 10/16/17 1339    PHYSICAL EXAMINATION: ECOG PERFORMANCE STATUS: 1 - Symptomatic but completely ambulatory  There were no vitals filed for this visit. There were no vitals filed for this  visit.  GENERAL:alert, no distress and comfortable SKIN: skin color, texture, turgor are normal, no rashes or significant lesions EYES: normal, Conjunctiva are pink and non-injected, sclera clear OROPHARYNX:no exudate, no erythema and lips, buccal mucosa, and tongue normal  NECK: supple, thyroid normal size, non-tender, without nodularity LYMPH:  no palpable lymphadenopathy in the cervical, axillary or inguinal LUNGS: clear to auscultation and percussion with normal breathing effort HEART: regular rate & rhythm and no murmurs and no lower extremity edema ABDOMEN:abdomen soft, non-tender and normal bowel sounds MUSCULOSKELETAL:no cyanosis of digits and no clubbing  NEURO: alert & oriented x 3 with fluent speech, no focal motor/sensory deficits EXTREMITIES: No lower extremity edema  LABORATORY DATA:  I have reviewed the data as listed   Chemistry      Component Value Date/Time   NA 140 10/16/2017 0911   K 3.9 10/16/2017 0911   CO2 26 10/16/2017 0911   BUN 7.3 10/16/2017 0911   CREATININE 0.7 10/16/2017 0911      Component Value Date/Time   CALCIUM 9.0 10/16/2017 0911   ALKPHOS 60 10/16/2017 0911   AST 14 10/16/2017 0911   ALT 11 10/16/2017 0911   BILITOT 0.29 10/16/2017 0911       Lab Results  Component Value Date   WBC 4.5 10/16/2017   HGB 11.5 (L) 10/16/2017   HCT 35.0 10/16/2017   MCV 89.3 10/16/2017   PLT 269 10/16/2017   NEUTROABS 2.7 10/16/2017    ASSESSMENT & PLAN:  Malignant neoplasm of upper-outer quadrant of left breast in female, estrogen receptor positive ([place]) 08/29/2017: Screening detected left breast mass 4 cm at 12 o'clock position, 1.4 cm lymph node the axilla, biopsy of the mass and the lymph nodes were positive for IDC with DCIS grade 3 ER 70% PR 0%, KI 2%, HER-2 negative ratio 1.77, T2N1 stage IIIa clinical stage AJCC 8  Mammaprinthigh risk luminal type B. Average 10-year risk of recurrence untreated 29%; with chemotherapy and hormonal therapy  distant metastasis free interval at 5 years 94.6%  09/11/2017 breast [id number] breast malignancy with associated linear NME extending posteriorly as well as satellite nodules measures up to 5.3 cm, at least 4 abnormal left axillary lymph nodes  Recommendation: CT scans negative for metastatic disease 1.Neoadjuvant chemotherapy with dose dense Adriamycin and Cytoxan followed by Taxol x12 2. Followed by breast conserving surgery and targeted lymph node dissection 3. Followed by radiation 4. Follow-up antiestrogen therapy -------------------------------------------------------------------- Current treatment: Cycle 2 day1neoadjuvant dose dense Adriamycin and Cytoxan Echocardiogram 09/24/2017: EF 55-60% Closely monitoring for chemotherapy toxicities.  Chemo Toxicities: 1.Nausea for 5 days after chemotherapy 2.we reduced the chemotherapy with cycle 2  3.Severe fatigue related to chemotherapy 4.Thrombocytopenia due to chemotherapy   Return to clinic in 2 weeks forcycle  3    I spent 25 minutes talking to the patient of which more than half was spent in counseling and coordination of care.  No orders of the defined types were placed in this encounter.  The patient has a good understanding of the overall plan. she agrees with it. she will call with any problems that may develop before the next visit  here.   Harriette Ohara, MD 10/16/17

## 2017-10-16 NOTE — Progress Notes (Signed)
Blood return noted before, every 3cc during and after Adriamycin push.   

## 2017-10-16 NOTE — Telephone Encounter (Signed)
No 12/26 los.  

## 2017-10-16 NOTE — Patient Instructions (Signed)
[facility] Discharge Instructions for Patients Receiving Chemotherapy  Today you received the following chemotherapy agents: Doxorubicin (Adriamycin) and Cyclophosphamide (Cytoxan)  To help prevent nausea and vomiting after your treatment, we encourage you to take your nausea medication  as prescribed.    If you develop nausea and vomiting that is not controlled by your nausea medication, call the clinic.   BELOW ARE SYMPTOMS THAT SHOULD BE REPORTED IMMEDIATELY:  *FEVER GREATER THAN 100.5 F  *CHILLS WITH OR WITHOUT FEVER  NAUSEA AND VOMITING THAT IS NOT CONTROLLED WITH YOUR NAUSEA MEDICATION  *UNUSUAL SHORTNESS OF BREATH  *UNUSUAL BRUISING OR BLEEDING  TENDERNESS IN MOUTH AND THROAT WITH OR WITHOUT PRESENCE OF ULCERS  *URINARY PROBLEMS  *BOWEL PROBLEMS  UNUSUAL RASH Items with * indicate a potential emergency and should be followed up as soon as possible.  Feel free to call the clinic should you have any questions or concerns. The clinic phone number is [phone].  Please show the CHEMO ALERT CARD at check-in to the Emergency Department and triage nurse.   

## 2017-10-16 NOTE — Progress Notes (Signed)
Patient Care Team: [name], MD as PCP - General (Family Medicine)  DIAGNOSIS:  Encounter Diagnosis  Name Primary?  . Malignant neoplasm of upper-outer quadrant of left breast in female, estrogen receptor positive ([place])     SUMMARY OF ONCOLOGIC HISTORY:   Malignant neoplasm of upper-outer quadrant of left breast in female, estrogen receptor positive ([place])   08/29/2017 Initial Diagnosis    Screening detected left breast mass 4 cm at 12 o'clock position, 1.4 cm lymph node the axilla, biopsy of the mass and the lymph nodes were positive for IDC with DCIS grade 3 ER 70% PR 0%, KI 2%, HER-2 negative ratio 1.77, T2N1 stage IIIa clinical stage AJCC 8      09/11/2017 Breast MRI    Left breast malignancy with associated linear NME extending posteriorly as well as satellite nodules measures up to 5.3 cm, at least 4 abnormal left axillary lymph nodes      09/11/2017 Imaging    CT CAP and Bone scan negative for metastatic disease      09/18/2017 Miscellaneous    Mammaprint high risk luminal type B.  Average 10-year risk of recurrence untreated 29%; with chemotherapy and hormonal therapy distant metastasis free interval at 5 years 94.6%      10/02/2017 -  Neo-Adjuvant Chemotherapy    Neoadjuvant chemotherapy with dose dense Adriamycin and Cytoxan x4 followed by Taxol weekly x12       CHIEF COMPLIANT: Cycle 2 neoadjuvant dose dense Adriamycin and Cytoxan  INTERVAL HISTORY: Tonya Jackson is a 51 year old with above-mentioned history of left breast cancer currently on neoadjuvant chemotherapy and today is cycle 2 of dose dense Adriamycin and Cytoxan.  After cycle 1 she had profound fatigue nausea and vomiting as well as severe neutropenia.  She has recovered very well from most of these symptoms and is here for cycle 2.  She denies any fevers or chills.  Nausea is also improved significantly.  REVIEW OF SYSTEMS:   Constitutional: Denies fevers, chills or abnormal weight loss Eyes:  Denies blurriness of vision Ears, nose, mouth, throat, and face: Denies mucositis or sore throat Respiratory: Denies cough, dyspnea or wheezes Cardiovascular: Denies palpitation, chest discomfort Gastrointestinal: Nausea and vomiting has improved Skin: Denies abnormal skin rashes Lymphatics: Denies new lymphadenopathy or easy bruising Neurological:Denies numbness, tingling or new weaknesses Behavioral/Psych: Mood is stable, no new changes  Extremities: No lower extremity edema  All other systems were reviewed with the patient and are negative.  I have reviewed the past medical history, past surgical history, social history and family history with the patient and they are unchanged from previous note.  ALLERGIES:  is allergic to other.  MEDICATIONS:  Current Outpatient Medications  Medication Sig Dispense Refill  . HYDROcodone-acetaminophen (NORCO/VICODIN) 5-325 MG tablet Take 1-2 tablets by mouth every 6 (six) hours as needed for moderate pain or severe pain. 15 tablet 0  . lidocaine-prilocaine (EMLA) cream Apply to affected area once 30 g 3  . LORazepam (ATIVAN) 0.5 MG tablet Take 1 tablet (0.5 mg total) by mouth at bedtime as needed (Nausea or vomiting). 30 tablet 0  . ondansetron (ZOFRAN) 8 MG tablet Take 1 tablet (8 mg total) by mouth 2 (two) times daily as needed. Start on the third day after chemotherapy. 30 tablet 1  . prochlorperazine (COMPAZINE) 10 MG tablet Take 1 tablet (10 mg total) by mouth every 6 (six) hours as needed (Nausea or vomiting). 30 tablet 1   No current facility-administered medications for this visit.  PHYSICAL EXAMINATION: ECOG PERFORMANCE STATUS: 1 - Symptomatic but completely ambulatory  Vitals:   10/16/17 1008  BP: 128/70  Pulse: 86  Resp: 16  Temp: 98.3 F (36.8 C)  SpO2: 100%   Filed Weights   10/16/17 1008  Weight: 131 lb 6.4 oz (59.6 kg)    GENERAL:alert, no distress and comfortable SKIN: skin color, texture, turgor are normal, no  rashes or significant lesions EYES: normal, Conjunctiva are pink and non-injected, sclera clear OROPHARYNX:no exudate, no erythema and lips, buccal mucosa, and tongue normal  NECK: supple, thyroid normal size, non-tender, without nodularity LYMPH:  no palpable lymphadenopathy in the cervical, axillary or inguinal LUNGS: clear to auscultation and percussion with normal breathing effort HEART: regular rate & rhythm and no murmurs and no lower extremity edema ABDOMEN:abdomen soft, non-tender and normal bowel sounds MUSCULOSKELETAL:no cyanosis of digits and no clubbing  NEURO: alert & oriented x 3 with fluent speech, no focal motor/sensory deficits EXTREMITIES: No lower extremity edema   LABORATORY DATA:  I have reviewed the data as listed   Chemistry      Component Value Date/Time   NA 140 10/16/2017 0911   K 3.9 10/16/2017 0911   CO2 26 10/16/2017 0911   BUN 7.3 10/16/2017 0911   CREATININE 0.7 10/16/2017 0911      Component Value Date/Time   CALCIUM 9.0 10/16/2017 0911   ALKPHOS 60 10/16/2017 0911   AST 14 10/16/2017 0911   ALT 11 10/16/2017 0911   BILITOT 0.29 10/16/2017 0911       Lab Results  Component Value Date   WBC 4.5 10/16/2017   HGB 11.5 (L) 10/16/2017   HCT 35.0 10/16/2017   MCV 89.3 10/16/2017   PLT 269 10/16/2017   NEUTROABS 2.7 10/16/2017    ASSESSMENT & PLAN:  Malignant neoplasm of upper-outer quadrant of left breast in female, estrogen receptor positive ([place]) 08/29/2017: Screening detected left breast mass 4 cm at 12 o'clock position, 1.4 cm lymph node the axilla, biopsy of the mass and the lymph nodes were positive for IDC with DCIS grade 3 ER 70% PR 0%, KI 2%, HER-2 negative ratio 1.77, T2N1 stage IIIa clinical stage AJCC 8  Mammaprinthigh risk luminal type B. Average 10-year risk of recurrence untreated 29%; with chemotherapy and hormonal therapy distant metastasis free interval at 5 years 94.6%  09/11/2017 breast [id number] breast malignancy with  associated linear NME extending posteriorly as well as satellite nodules measures up to 5.3 cm, at least 4 abnormal left axillary lymph nodes  Recommendation: CT scans negative for metastatic disease 1.Neoadjuvant chemotherapy with dose dense Adriamycin and Cytoxan followed by Taxol x12 2. Followed by breast conserving surgery and targeted lymph node dissection 3. Followed by radiation 4. Follow-up antiestrogen therapy -------------------------------------------------------------------- Current treatment: Cycle 2 day 1 neoadjuvant dose dense Adriamycin and Cytoxan Echocardiogram 09/24/2017: EF 55-60% Closely monitoring for chemotherapy toxicities.  Chemo Toxicities: 1. Nausea for 5 days after chemotherapy 2. we reduced the chemotherapy with cycle 2  3.  Severe fatigue related to chemotherapy 4.  Thrombocytopenia due to chemotherapy    Return to clinic in 2 weeks for cycle  3     I spent 25 minutes talking to the patient of which more than half was spent in counseling and coordination of care.  No orders of the defined types were placed in this encounter.  The patient has a good understanding of the overall plan. she agrees with it. she will call with any problems that may develop before the  next visit here.   Harriette Ohara, MD 10/16/17

## 2017-10-18 ENCOUNTER — Encounter: Payer: Self-pay | Admitting: Hematology and Oncology

## 2017-10-18 NOTE — Progress Notes (Signed)
Pt is approved for the $1000 [employer] grant.  

## 2017-10-29 NOTE — Assessment & Plan Note (Signed)
08/29/2017: Screening detected left breast mass 4 cm at 12 o'clock position, 1.4 cm lymph node the axilla, biopsy of the mass and the lymph nodes were positive for IDC with DCIS grade 3 ER 70% PR 0%, KI 2%, HER-2 negative ratio 1.77, T2N1 stage IIIa clinical stage AJCC 8  Mammaprinthigh risk luminal type B. Average 10-year risk of recurrence untreated 29%; with chemotherapy and hormonal therapy distant metastasis free interval at 5 years 94.6%  09/11/2017 breast [id number] breast malignancy with associated linear NME extending posteriorly as well as satellite nodules measures up to 5.3 cm, at least 4 abnormal left axillary lymph nodes  Recommendation: CT scans negative for metastatic disease 1.Neoadjuvant chemotherapy with dose dense Adriamycin and Cytoxan followed by Taxol x12 2. Followed by breast conserving surgery and targeted lymph node dissection 3. Followed by radiation 4. Follow-up antiestrogen therapy -------------------------------------------------------------------- Current treatment: Cycle 3 day1neoadjuvant dose dense Adriamycin and Cytoxan Echocardiogram 09/24/2017: EF 55-60% Closely monitoring for chemotherapy toxicities.  Chemo Toxicities: 1.Nausea for 5 days after chemotherapy 2.we reduced the chemotherapy with cycle 2  3.Severe fatigue related to chemotherapy 4.Thrombocytopenia due to chemotherapy   Return to clinic in 2 weeks forcycle  4

## 2017-10-30 ENCOUNTER — Inpatient Hospital Stay: Payer: BLUE CROSS/BLUE SHIELD | Attending: Hematology and Oncology

## 2017-10-30 ENCOUNTER — Inpatient Hospital Stay: Payer: BLUE CROSS/BLUE SHIELD

## 2017-10-30 ENCOUNTER — Inpatient Hospital Stay (HOSPITAL_BASED_OUTPATIENT_CLINIC_OR_DEPARTMENT_OTHER): Payer: BLUE CROSS/BLUE SHIELD | Admitting: Hematology and Oncology

## 2017-10-30 ENCOUNTER — Encounter: Payer: Self-pay | Admitting: Hematology and Oncology

## 2017-10-30 DIAGNOSIS — Z170 Estrogen receptor positive status [ER+]: Principal | ICD-10-CM

## 2017-10-30 DIAGNOSIS — D6481 Anemia due to antineoplastic chemotherapy: Secondary | ICD-10-CM | POA: Insufficient documentation

## 2017-10-30 DIAGNOSIS — R5383 Other fatigue: Secondary | ICD-10-CM

## 2017-10-30 DIAGNOSIS — Z5111 Encounter for antineoplastic chemotherapy: Secondary | ICD-10-CM

## 2017-10-30 DIAGNOSIS — Z79899 Other long term (current) drug therapy: Secondary | ICD-10-CM

## 2017-10-30 DIAGNOSIS — C50412 Malignant neoplasm of upper-outer quadrant of left female breast: Secondary | ICD-10-CM

## 2017-10-30 DIAGNOSIS — Z7689 Persons encountering health services in other specified circumstances: Secondary | ICD-10-CM | POA: Insufficient documentation

## 2017-10-30 DIAGNOSIS — Z95828 Presence of other vascular implants and grafts: Secondary | ICD-10-CM

## 2017-10-30 LAB — COMPREHENSIVE METABOLIC PANEL
ALBUMIN: 3.9 g/dL (ref 3.5–5.0)
ALK PHOS: 63 U/L (ref 40–150)
ALT: 12 U/L (ref 0–55)
AST: 12 U/L (ref 5–34)
Anion gap: 8 (ref 3–11)
BILIRUBIN TOTAL: 0.4 mg/dL (ref 0.2–1.2)
BUN: 7 mg/dL (ref 7–26)
CO2: 27 mmol/L (ref 22–29)
Calcium: 8.9 mg/dL (ref 8.4–10.4)
Chloride: 106 mmol/L (ref 98–109)
Creatinine, Ser: 0.69 mg/dL (ref 0.60–1.10)
GFR calc Af Amer: >60 mL/min (ref >60)
GFR calc non Af Amer: >60 mL/min (ref >60)
GLUCOSE: 93 mg/dL (ref 70–140)
POTASSIUM: 3.6 mmol/L (ref 3.3–4.7)
Sodium: 141 mmol/L (ref 136–145)
TOTAL PROTEIN: 6.7 g/dL (ref 6.4–8.3)

## 2017-10-30 LAB — CBC WITH DIFFERENTIAL/PLATELET
ABS GRANULOCYTE: 4.1 10*3/uL (ref 1.5–6.5)
BASOS PCT: 0 %
Basophils Absolute: 0 10*3/uL (ref 0.0–0.1)
Eosinophils Absolute: 0 10*3/uL (ref 0.0–0.5)
Eosinophils Relative: 0 %
HEMATOCRIT: 32.3 % — AB (ref 34.8–46.6)
HEMOGLOBIN: 10.6 g/dL — AB (ref 11.6–15.9)
Lymphocytes Relative: 17 %
Lymphs Abs: 0.9 10*3/uL (ref 0.9–3.3)
MCH: 29.3 pg (ref 25.1–34.0)
MCHC: 32.8 g/dL (ref 31.5–36.0)
MCV: 89.2 fL (ref 79.5–101.0)
Monocytes Absolute: 0.6 10*3/uL (ref 0.1–0.9)
Monocytes Relative: 11 %
NEUTROS ABS: 4.1 10*3/uL (ref 1.5–6.5)
Neutrophils Relative %: 72 %
Platelets: 209 10*3/uL (ref 145–400)
RBC: 3.62 MIL/uL — AB (ref 3.70–5.45)
RDW: 14.2 % (ref 11.2–16.1)
WBC: 5.6 10*3/uL (ref 3.9–10.3)

## 2017-10-30 MED ORDER — PEGFILGRASTIM 6 MG/0.6ML ~~LOC~~ PSKT
6.0000 mg | PREFILLED_SYRINGE | Freq: Once | SUBCUTANEOUS | Status: AC
  Administered 2017-10-30: 6 mg via SUBCUTANEOUS

## 2017-10-30 MED ORDER — SODIUM CHLORIDE 0.9 % IV SOLN
500.0000 mg/m2 | Freq: Once | INTRAVENOUS | Status: AC
  Administered 2017-10-30: 860 mg via INTRAVENOUS
  Filled 2017-10-30: qty 43

## 2017-10-30 MED ORDER — SODIUM CHLORIDE 0.9 % IV SOLN
Freq: Once | INTRAVENOUS | Status: AC
  Administered 2017-10-30: 12:00:00 via INTRAVENOUS

## 2017-10-30 MED ORDER — HEPARIN SOD (PORK) LOCK FLUSH 100 UNIT/ML IV SOLN
500.0000 [IU] | Freq: Once | INTRAVENOUS | Status: DC
  Filled 2017-10-30: qty 5

## 2017-10-30 MED ORDER — SODIUM CHLORIDE 0.9% FLUSH
10.0000 mL | Freq: Once | INTRAVENOUS | Status: AC
Start: 2017-10-30 — End: 2017-10-30
  Administered 2017-10-30: 10 mL
  Filled 2017-10-30: qty 10

## 2017-10-30 MED ORDER — PALONOSETRON HCL INJECTION 0.25 MG/5ML
0.2500 mg | Freq: Once | INTRAVENOUS | Status: AC
  Administered 2017-10-30: 0.25 mg via INTRAVENOUS

## 2017-10-30 MED ORDER — PALONOSETRON HCL INJECTION 0.25 MG/5ML
INTRAVENOUS | Status: AC
  Filled 2017-10-30: qty 5

## 2017-10-30 MED ORDER — PEGFILGRASTIM 6 MG/0.6ML ~~LOC~~ PSKT
PREFILLED_SYRINGE | SUBCUTANEOUS | Status: AC
  Filled 2017-10-30: qty 0.6

## 2017-10-30 MED ORDER — HEPARIN SOD (PORK) LOCK FLUSH 100 UNIT/ML IV SOLN
500.0000 [IU] | Freq: Once | INTRAVENOUS | Status: AC | PRN
  Administered 2017-10-30: 500 [IU]
  Filled 2017-10-30: qty 5

## 2017-10-30 MED ORDER — FOSAPREPITANT DIMEGLUMINE INJECTION 150 MG
Freq: Once | INTRAVENOUS | Status: AC
  Administered 2017-10-30: 12:00:00 via INTRAVENOUS
  Filled 2017-10-30: qty 5

## 2017-10-30 MED ORDER — DOXORUBICIN HCL CHEMO IV INJECTION 2 MG/ML
50.0000 mg/m2 | Freq: Once | INTRAVENOUS | Status: AC
  Administered 2017-10-30: 86 mg via INTRAVENOUS
  Filled 2017-10-30: qty 43

## 2017-10-30 NOTE — Progress Notes (Signed)
Patient Care Team: [name], MD as PCP - General (Family Medicine)  DIAGNOSIS:  Encounter Diagnosis  Name Primary?  . Malignant neoplasm of upper-outer quadrant of left breast in female, estrogen receptor positive ([place])     SUMMARY OF ONCOLOGIC HISTORY:   Malignant neoplasm of upper-outer quadrant of left breast in female, estrogen receptor positive ([place])   08/29/2017 Initial Diagnosis    Screening detected left breast mass 4 cm at 12 o'clock position, 1.4 cm lymph node the axilla, biopsy of the mass and the lymph nodes were positive for IDC with DCIS grade 3 ER 70% PR 0%, KI 2%, HER-2 negative ratio 1.77, T2N1 stage IIIa clinical stage AJCC 8      09/11/2017 Breast MRI    Left breast malignancy with associated linear NME extending posteriorly as well as satellite nodules measures up to 5.3 cm, at least 4 abnormal left axillary lymph nodes      09/11/2017 Imaging    CT CAP and Bone scan negative for metastatic disease      09/18/2017 Miscellaneous    Mammaprint high risk luminal type B.  Average 10-year risk of recurrence untreated 29%; with chemotherapy and hormonal therapy distant metastasis free interval at 5 years 94.6%      10/02/2017 -  Neo-Adjuvant Chemotherapy    Neoadjuvant chemotherapy with dose dense Adriamycin and Cytoxan x4 followed by Taxol weekly x12       CHIEF COMPLIANT: Cycle 3 Adriamycin and Cytoxan  INTERVAL HISTORY: Tonya Jackson is a 52 year old with above-mentioned history of left breast cancer who is currently neoadjuvant chemotherapy with dose dense he remains on Cytoxan.  Today is cycle 3.  She tolerated cycle 2 much better.  She had one day of severe fatigue and couple of days where she had nausea.  Nausea medications appear to have helped her.  She has been eating and drinking very well.  REVIEW OF SYSTEMS:   Constitutional: Denies fevers, chills or abnormal weight loss Eyes: Denies blurriness of vision Ears, nose, mouth, throat, and  face: Denies mucositis or sore throat Respiratory: Denies cough, dyspnea or wheezes Cardiovascular: Denies palpitation, chest discomfort Gastrointestinal: Nausea after last cycle but it improved over time Skin: Denies abnormal skin rashes Lymphatics: Denies new lymphadenopathy or easy bruising Neurological:Denies numbness, tingling or new weaknesses Behavioral/Psych: Mood is stable, no new changes  Extremities: No lower extremity edema  All other systems were reviewed with the patient and are negative.  I have reviewed the past medical history, past surgical history, social history and family history with the patient and they are unchanged from previous note.  ALLERGIES:  is allergic to other.  MEDICATIONS:  Current Outpatient Medications  Medication Sig Dispense Refill  . HYDROcodone-acetaminophen (NORCO/VICODIN) 5-325 MG tablet Take 1-2 tablets by mouth every 6 (six) hours as needed for moderate pain or severe pain. 15 tablet 0  . lidocaine-prilocaine (EMLA) cream Apply to affected area once 30 g 3  . LORazepam (ATIVAN) 0.5 MG tablet Take 1 tablet (0.5 mg total) by mouth at bedtime as needed (Nausea or vomiting). 30 tablet 0  . ondansetron (ZOFRAN) 8 MG tablet Take 1 tablet (8 mg total) by mouth 2 (two) times daily as needed. Start on the third day after chemotherapy. 30 tablet 1  . prochlorperazine (COMPAZINE) 10 MG tablet Take 1 tablet (10 mg total) by mouth every 6 (six) hours as needed (Nausea or vomiting). 30 tablet 1   No current facility-administered medications for this visit.  PHYSICAL EXAMINATION: ECOG PERFORMANCE STATUS: 1 - Symptomatic but completely ambulatory  Vitals:   10/30/17 1031  BP: 120/66  Pulse: 96  Resp: 18  Temp: 98.2 F (36.8 C)  SpO2: 100%   Filed Weights   10/30/17 1031  Weight: 129 lb 11.2 oz (58.8 kg)    GENERAL:alert, no distress and comfortable SKIN: skin color, texture, turgor are normal, no rashes or significant lesions EYES:  normal, Conjunctiva are pink and non-injected, sclera clear OROPHARYNX:no exudate, no erythema and lips, buccal mucosa, and tongue normal  NECK: supple, thyroid normal size, non-tender, without nodularity LYMPH:  no palpable lymphadenopathy in the cervical, axillary or inguinal LUNGS: clear to auscultation and percussion with normal breathing effort HEART: regular rate & rhythm and no murmurs and no lower extremity edema ABDOMEN:abdomen soft, non-tender and normal bowel sounds MUSCULOSKELETAL:no cyanosis of digits and no clubbing  NEURO: alert & oriented x 3 with fluent speech, no focal motor/sensory deficits EXTREMITIES: No lower extremity edema  LABORATORY DATA:  I have reviewed the data as listed CMP Latest Ref Rng & Units 10/16/2017 10/09/2017 10/02/2017  Glucose 70 - 140 mg/dl 90 93 92  BUN 7.0 - 26.0 mg/dL 7.3 13.5 7.2  Creatinine 0.6 - 1.1 mg/dL 0.7 0.8 0.7  Sodium 136 - 145 mEq/L 140 134(L) 141  Potassium 3.5 - 5.1 mEq/L 3.9 4.2 3.7  CO2 22 - 29 mEq/L '26 27 27  '$ Calcium 8.4 - 10.4 mg/dL 9.0 9.2 9.3  Total Protein 6.4 - 8.3 g/dL 6.8 7.0 7.3  Total Bilirubin 0.20 - 1.20 mg/dL 0.29 1.04 0.81  Alkaline Phos 40 - 150 U/L 60 59 51  AST 5 - 34 U/L '14 11 15  '$ ALT 0 - 55 U/L '11 14 9    '$ Lab Results  Component Value Date   WBC 5.6 10/30/2017   HGB 10.6 (L) 10/30/2017   HCT 32.3 (L) 10/30/2017   MCV 89.2 10/30/2017   PLT 209 10/30/2017   NEUTROABS 4.1 10/30/2017    ASSESSMENT & PLAN:  Malignant neoplasm of upper-outer quadrant of left breast in female, estrogen receptor positive ([place]) 08/29/2017: Screening detected left breast mass 4 cm at 12 o'clock position, 1.4 cm lymph node the axilla, biopsy of the mass and the lymph nodes were positive for IDC with DCIS grade 3 ER 70% PR 0%, KI 2%, HER-2 negative ratio 1.77, T2N1 stage IIIa clinical stage AJCC 8  Mammaprinthigh risk luminal type B. Average 10-year risk of recurrence untreated 29%; with chemotherapy and hormonal therapy  distant metastasis free interval at 5 years 94.6%  09/11/2017 breast [id number]:[id number] breast malignancy with associated linear NME extending posteriorly as well as satellite nodules measures up to 5.3 cm, at least 4 abnormal left axillary lymph nodes  Recommendation: CT scans negative for metastatic disease 1.Neoadjuvant chemotherapy with dose dense Adriamycin and Cytoxan followed by Taxol x12 2. Followed by breast conserving surgery and targeted lymph node dissection 3. Followed by radiation 4. Follow-up antiestrogen therapy -------------------------------------------------------------------- Current treatment: Cycle 3 day1neoadjuvant dose dense Adriamycin and Cytoxan Echocardiogram 09/24/2017: EF 55-60% Closely monitoring for chemotherapy toxicities.  Chemo Toxicities: 1.Nausea for 2 days after chemotherapy which got better after week reduced the chemotherapy with cycle 2  2.Severe fatigue related to chemotherapy 3. Anemia due to chemotherapy: Being monitored   Return to clinic in 2 weeks forcycle  4     I spent 25 minutes talking to the patient of which more than half was spent in counseling and coordination of care.  No orders  of the defined types were placed in this encounter.  The patient has a good understanding of the overall plan. she agrees with it. she will call with any problems that may develop before the next visit here.   Harriette Ohara, MD 10/30/17

## 2017-10-30 NOTE — Patient Instructions (Signed)
Implanted Port Home Guide An implanted port is a type of central line that is placed under the skin. Central lines are used to provide IV access when treatment or nutrition needs to be given through a person's veins. Implanted ports are used for long-term IV access. An implanted port may be placed because:  You need IV medicine that would be irritating to the small veins in your hands or arms.  You need long-term IV medicines, such as antibiotics.  You need IV nutrition for a long period.  You need frequent blood draws for lab tests.  You need dialysis.  Implanted ports are usually placed in the chest area, but they can also be placed in the upper arm, the abdomen, or the leg. An implanted port has two main parts:  Reservoir. The reservoir is round and will appear as a small, raised area under your skin. The reservoir is the part where a needle is inserted to give medicines or draw blood.  Catheter. The catheter is a thin, flexible tube that extends from the reservoir. The catheter is placed into a large vein. Medicine that is inserted into the reservoir goes into the catheter and then into the vein.  How will I care for my incision site? Do not get the incision site wet. Bathe or shower as directed by your health care provider. How is my port accessed? Special steps must be taken to access the port:  Before the port is accessed, a numbing cream can be placed on the skin. This helps numb the skin over the port site.  Your health care provider uses a sterile technique to access the port. ? Your health care provider must put on a mask and sterile gloves. ? The skin over your port is cleaned carefully with an antiseptic and allowed to dry. ? The port is gently pinched between sterile gloves, and a needle is inserted into the port.  Only "non-coring" port needles should be used to access the port. Once the port is accessed, a blood return should be checked. This helps ensure that the port  is in the vein and is not clogged.  If your port needs to remain accessed for a constant infusion, a clear (transparent) bandage will be placed over the needle site. The bandage and needle will need to be changed every week, or as directed by your health care provider.  Keep the bandage covering the needle clean and dry. Do not get it wet. Follow your health care provider's instructions on how to take a shower or bath while the port is accessed.  If your port does not need to stay accessed, no bandage is needed over the port.  What is flushing? Flushing helps keep the port from getting clogged. Follow your health care provider's instructions on how and when to flush the port. Ports are usually flushed with saline solution or a medicine called heparin. The need for flushing will depend on how the port is used.  If the port is used for intermittent medicines or blood draws, the port will need to be flushed: ? After medicines have been given. ? After blood has been drawn. ? As part of routine maintenance.  If a constant infusion is running, the port may not need to be flushed.  How long will my port stay implanted? The port can stay in for as long as your health care provider thinks it is needed. When it is time for the port to come out, surgery will be   done to remove it. The procedure is similar to the one performed when the port was put in. When should I seek immediate medical care? When you have an implanted port, you should seek immediate medical care if:  You notice a bad smell coming from the incision site.  You have swelling, redness, or drainage at the incision site.  You have more swelling or pain at the port site or the surrounding area.  You have a fever that is not controlled with medicine.  This information is not intended to replace advice given to you by your health care provider. Make sure you discuss any questions you have with your health care provider. Document  Released: 10/08/2005 Document Revised: 03/15/2016 Document Reviewed: 06/15/2013 Elsevier Interactive Patient Education  2017 [employer]  

## 2017-10-30 NOTE — Patient Instructions (Signed)
[facility] Discharge Instructions for Patients Receiving Chemotherapy  Today you received the following chemotherapy agents Adriamycin, Cytoxan.  To help prevent nausea and vomiting after your treatment, we encourage you to take your nausea medication as prescribed.   If you develop nausea and vomiting that is not controlled by your nausea medication, call the clinic.   BELOW ARE SYMPTOMS THAT SHOULD BE REPORTED IMMEDIATELY:  *FEVER GREATER THAN 100.5 F  *CHILLS WITH OR WITHOUT FEVER  NAUSEA AND VOMITING THAT IS NOT CONTROLLED WITH YOUR NAUSEA MEDICATION  *UNUSUAL SHORTNESS OF BREATH  *UNUSUAL BRUISING OR BLEEDING  TENDERNESS IN MOUTH AND THROAT WITH OR WITHOUT PRESENCE OF ULCERS  *URINARY PROBLEMS  *BOWEL PROBLEMS  UNUSUAL RASH Items with * indicate a potential emergency and should be followed up as soon as possible.  Feel free to call the clinic should you have any questions or concerns. The clinic phone number is [phone].  Please show the CHEMO ALERT CARD at check-in to the Emergency Department and triage nurse.   

## 2017-10-30 NOTE — Progress Notes (Signed)
Patient walked in today to bring bills to use her grant for and received a gas card.   Patient had questions about assistance with bills, gave number to [place] in [place] and also referred to billing number for bills. Mentioned access one to her as well. Patient verbalized understanding and has Lenise's contact information for any additional financial questions or concerns.

## 2017-11-12 NOTE — Assessment & Plan Note (Signed)
08/29/2017: Screening detected left breast mass 4 cm at 12 o'clock position, 1.4 cm lymph node the axilla, biopsy of the mass and the lymph nodes were positive for IDC with DCIS grade 3 ER 70% PR 0%, KI 2%, HER-2 negative ratio 1.77, T2N1 stage IIIa clinical stage AJCC 8  Mammaprinthigh risk luminal type B. Average 10-year risk of recurrence untreated 29%; with chemotherapy and hormonal therapy distant metastasis free interval at 5 years 94.6%  09/11/2017 breast MRI:Left breast malignancy with associated linear NME extending posteriorly as well as satellite nodules measures up to 5.3 cm, at least 4 abnormal left axillary lymph nodes  Recommendation: CT scans negative for metastatic disease 1.Neoadjuvant chemotherapy with dose dense Adriamycin and Cytoxan followed by Taxol x12 2. Followed by breast conserving surgery and targeted lymph node dissection 3. Followed by radiation 4. Follow-up antiestrogen therapy -------------------------------------------------------------------- Current treatment: Cycle4day1neoadjuvant dose dense Adriamycin and Cytoxan Echocardiogram 09/24/2017: EF 55-60% Closely monitoring for chemotherapy toxicities.  Chemo Toxicities: 1.Nausea for 2 days after chemotherapy which got better after week reducedthe chemotherapy with cycle 2  2.Severe fatigue related to chemotherapy 3. Anemia due to chemotherapy: Being monitored   Return to clinic in2weeksforcycle1 Taxol   

## 2017-11-13 ENCOUNTER — Inpatient Hospital Stay: Payer: BLUE CROSS/BLUE SHIELD

## 2017-11-13 ENCOUNTER — Inpatient Hospital Stay (HOSPITAL_BASED_OUTPATIENT_CLINIC_OR_DEPARTMENT_OTHER): Payer: BLUE CROSS/BLUE SHIELD | Admitting: Hematology and Oncology

## 2017-11-13 VITALS — HR 95

## 2017-11-13 DIAGNOSIS — D6481 Anemia due to antineoplastic chemotherapy: Secondary | ICD-10-CM

## 2017-11-13 DIAGNOSIS — C50412 Malignant neoplasm of upper-outer quadrant of left female breast: Secondary | ICD-10-CM

## 2017-11-13 DIAGNOSIS — Z7689 Persons encountering health services in other specified circumstances: Secondary | ICD-10-CM

## 2017-11-13 DIAGNOSIS — Z79899 Other long term (current) drug therapy: Secondary | ICD-10-CM | POA: Diagnosis not present

## 2017-11-13 DIAGNOSIS — Z170 Estrogen receptor positive status [ER+]: Principal | ICD-10-CM

## 2017-11-13 DIAGNOSIS — Z95828 Presence of other vascular implants and grafts: Secondary | ICD-10-CM

## 2017-11-13 DIAGNOSIS — Z5111 Encounter for antineoplastic chemotherapy: Secondary | ICD-10-CM

## 2017-11-13 DIAGNOSIS — R5383 Other fatigue: Secondary | ICD-10-CM

## 2017-11-13 LAB — CBC WITH DIFFERENTIAL/PLATELET
BASOS PCT: 0 %
Basophils Absolute: 0 10*3/uL (ref 0.0–0.1)
EOS ABS: 0 10*3/uL (ref 0.0–0.5)
EOS PCT: 0 %
HEMATOCRIT: 31 % — AB (ref 34.8–46.6)
Hemoglobin: 10.2 g/dL — ABNORMAL LOW (ref 11.6–15.9)
Lymphocytes Relative: 9 %
Lymphs Abs: 0.8 10*3/uL — ABNORMAL LOW (ref 0.9–3.3)
MCH: 30 pg (ref 25.1–34.0)
MCHC: 32.9 g/dL (ref 31.5–36.0)
MCV: 91.2 fL (ref 79.5–101.0)
MONO ABS: 0.7 10*3/uL (ref 0.1–0.9)
Monocytes Relative: 8 %
NEUTROS ABS: 7.1 10*3/uL — AB (ref 1.5–6.5)
Neutrophils Relative %: 83 %
PLATELETS: 240 10*3/uL (ref 145–400)
RBC: 3.4 MIL/uL — ABNORMAL LOW (ref 3.70–5.45)
RDW: 16.4 % — AB (ref 11.2–16.1)
WBC: 8.6 10*3/uL (ref 3.9–10.3)

## 2017-11-13 LAB — COMPREHENSIVE METABOLIC PANEL
ALBUMIN: 3.8 g/dL (ref 3.5–5.0)
ALT: 14 U/L (ref 0–55)
ANION GAP: 8 (ref 3–11)
AST: 12 U/L (ref 5–34)
Alkaline Phosphatase: 69 U/L (ref 40–150)
BUN: 8 mg/dL (ref 7–26)
CHLORIDE: 105 mmol/L (ref 98–109)
CO2: 29 mmol/L (ref 22–29)
Calcium: 9 mg/dL (ref 8.4–10.4)
Creatinine, Ser: 0.69 mg/dL (ref 0.60–1.10)
GFR calc Af Amer: >60 mL/min (ref >60)
GFR calc non Af Amer: >60 mL/min (ref >60)
Glucose, Bld: 120 mg/dL (ref 70–140)
POTASSIUM: 4 mmol/L (ref 3.3–4.7)
SODIUM: 142 mmol/L (ref 136–145)
Total Bilirubin: 0.3 mg/dL (ref 0.2–1.2)
Total Protein: 6.6 g/dL (ref 6.4–8.3)

## 2017-11-13 MED ORDER — PALONOSETRON HCL INJECTION 0.25 MG/5ML
0.2500 mg | Freq: Once | INTRAVENOUS | Status: AC
  Administered 2017-11-13: 0.25 mg via INTRAVENOUS

## 2017-11-13 MED ORDER — SODIUM CHLORIDE 0.9 % IV SOLN
Freq: Once | INTRAVENOUS | Status: AC
  Administered 2017-11-13: 13:00:00 via INTRAVENOUS

## 2017-11-13 MED ORDER — PEGFILGRASTIM 6 MG/0.6ML ~~LOC~~ PSKT
6.0000 mg | PREFILLED_SYRINGE | Freq: Once | SUBCUTANEOUS | Status: AC
  Administered 2017-11-13: 6 mg via SUBCUTANEOUS

## 2017-11-13 MED ORDER — HEPARIN SOD (PORK) LOCK FLUSH 100 UNIT/ML IV SOLN
500.0000 [IU] | Freq: Once | INTRAVENOUS | Status: AC | PRN
  Administered 2017-11-13: 500 [IU]
  Filled 2017-11-13: qty 5

## 2017-11-13 MED ORDER — SODIUM CHLORIDE 0.9% FLUSH
10.0000 mL | INTRAVENOUS | Status: DC | PRN
  Administered 2017-11-13: 10 mL
  Filled 2017-11-13: qty 10

## 2017-11-13 MED ORDER — PEGFILGRASTIM 6 MG/0.6ML ~~LOC~~ PSKT
PREFILLED_SYRINGE | SUBCUTANEOUS | Status: AC
  Filled 2017-11-13: qty 0.6

## 2017-11-13 MED ORDER — CYCLOPHOSPHAMIDE CHEMO INJECTION 1 GM
500.0000 mg/m2 | Freq: Once | INTRAMUSCULAR | Status: AC
  Administered 2017-11-13: 860 mg via INTRAVENOUS
  Filled 2017-11-13: qty 43

## 2017-11-13 MED ORDER — PALONOSETRON HCL INJECTION 0.25 MG/5ML
INTRAVENOUS | Status: AC
  Filled 2017-11-13: qty 5

## 2017-11-13 MED ORDER — SODIUM CHLORIDE 0.9% FLUSH
10.0000 mL | Freq: Once | INTRAVENOUS | Status: AC
  Administered 2017-11-13: 10 mL
  Filled 2017-11-13: qty 10

## 2017-11-13 MED ORDER — SODIUM CHLORIDE 0.9 % IV SOLN
Freq: Once | INTRAVENOUS | Status: AC
  Administered 2017-11-13: 13:00:00 via INTRAVENOUS
  Filled 2017-11-13: qty 5

## 2017-11-13 MED ORDER — DOXORUBICIN HCL CHEMO IV INJECTION 2 MG/ML
50.0000 mg/m2 | Freq: Once | INTRAVENOUS | Status: AC
  Administered 2017-11-13: 86 mg via INTRAVENOUS
  Filled 2017-11-13: qty 43

## 2017-11-13 NOTE — Patient Instructions (Signed)
[facility] Discharge Instructions for Patients Receiving Chemotherapy  Today you received the following chemotherapy agents: Adriamycin, Cytoxan  To help prevent nausea and vomiting after your treatment, we encourage you to take your nausea medication as directed.   If you develop nausea and vomiting that is not controlled by your nausea medication, call the clinic.   BELOW ARE SYMPTOMS THAT SHOULD BE REPORTED IMMEDIATELY:  *FEVER GREATER THAN 100.5 F  *CHILLS WITH OR WITHOUT FEVER  NAUSEA AND VOMITING THAT IS NOT CONTROLLED WITH YOUR NAUSEA MEDICATION  *UNUSUAL SHORTNESS OF BREATH  *UNUSUAL BRUISING OR BLEEDING  TENDERNESS IN MOUTH AND THROAT WITH OR WITHOUT PRESENCE OF ULCERS  *URINARY PROBLEMS  *BOWEL PROBLEMS  UNUSUAL RASH Items with * indicate a potential emergency and should be followed up as soon as possible.  Feel free to call the clinic should you have any questions or concerns. The clinic phone number is [phone].  Please show the CHEMO ALERT CARD at check-in to the Emergency Department and triage nurse.   

## 2017-11-13 NOTE — Progress Notes (Signed)
Patient Care Team: [name], MD as PCP - General (Family Medicine)  DIAGNOSIS:  Encounter Diagnosis  Name Primary?  . Malignant neoplasm of upper-outer quadrant of left breast in female, estrogen receptor positive ([place])     SUMMARY OF ONCOLOGIC HISTORY:   Malignant neoplasm of upper-outer quadrant of left breast in female, estrogen receptor positive ([place])   08/29/2017 Initial Diagnosis    Screening detected left breast mass 4 cm at 12 o'clock position, 1.4 cm lymph node the axilla, biopsy of the mass and the lymph nodes were positive for IDC with DCIS grade 3 ER 70% PR 0%, KI 2%, HER-2 negative ratio 1.77, T2N1 stage IIIa clinical stage AJCC 8      09/11/2017 Breast MRI    Left breast malignancy with associated linear NME extending posteriorly as well as satellite nodules measures up to 5.3 cm, at least 4 abnormal left axillary lymph nodes      09/11/2017 Imaging    CT CAP and Bone scan negative for metastatic disease      09/18/2017 Miscellaneous    Mammaprint high risk luminal type B.  Average 10-year risk of recurrence untreated 29%; with chemotherapy and hormonal therapy distant metastasis free interval at 5 years 94.6%      10/02/2017 -  Neo-Adjuvant Chemotherapy    Neoadjuvant chemotherapy with dose dense Adriamycin and Cytoxan x4 followed by Taxol weekly x12       CHIEF COMPLIANT: Cycle 4 Adriamycin and Cytoxan  INTERVAL HISTORY: Tonya Jackson is a 52 year old with above-mentioned history of left breast cancer currently neoadjuvant chemotherapy and today's cycle 4 of dose dense Adriamycin and Cytoxan.  After the last cycle she had couple of days of nausea and fatigue.  She did not throw up.  She was able to recover from it and continue to work full-time throughout the treatment.  REVIEW OF SYSTEMS:   Constitutional: Denies fevers, chills or abnormal weight loss, complains of fatigue and lack of taste Eyes: Denies blurriness of vision Ears, nose, mouth,  throat, and face: Denies mucositis or sore throat Respiratory: Denies cough, dyspnea or wheezes Cardiovascular: Denies palpitation, chest discomfort Gastrointestinal:  Denies nausea, heartburn or change in bowel habits Skin: Denies abnormal skin rashes Lymphatics: Denies new lymphadenopathy or easy bruising Neurological:Denies numbness, tingling or new weaknesses Behavioral/Psych: Mood is stable, no new changes  Extremities: No lower extremity edema  All other systems were reviewed with the patient and are negative.  I have reviewed the past medical history, past surgical history, social history and family history with the patient and they are unchanged from previous note.  ALLERGIES:  is allergic to other.  MEDICATIONS:  Current Outpatient Medications  Medication Sig Dispense Refill  . HYDROcodone-acetaminophen (NORCO/VICODIN) 5-325 MG tablet Take 1-2 tablets by mouth every 6 (six) hours as needed for moderate pain or severe pain. 15 tablet 0  . lidocaine-prilocaine (EMLA) cream Apply to affected area once 30 g 3  . LORazepam (ATIVAN) 0.5 MG tablet Take 1 tablet (0.5 mg total) by mouth at bedtime as needed (Nausea or vomiting). 30 tablet 0  . ondansetron (ZOFRAN) 8 MG tablet Take 1 tablet (8 mg total) by mouth 2 (two) times daily as needed. Start on the third day after chemotherapy. 30 tablet 1  . prochlorperazine (COMPAZINE) 10 MG tablet Take 1 tablet (10 mg total) by mouth every 6 (six) hours as needed (Nausea or vomiting). 30 tablet 1   No current facility-administered medications for this visit.  PHYSICAL EXAMINATION: ECOG PERFORMANCE STATUS: 1 - Symptomatic but completely ambulatory  Vitals:   11/13/17 1134  BP: 112/78  Pulse: (!) 106  Resp: 18  Temp: 98.4 F (36.9 C)  SpO2: 100%   Filed Weights   11/13/17 1134  Weight: 130 lb 9.6 oz (59.2 kg)    GENERAL:alert, no distress and comfortable SKIN: skin color, texture, turgor are normal, no rashes or significant  lesions EYES: normal, Conjunctiva are pink and non-injected, sclera clear OROPHARYNX:no exudate, no erythema and lips, buccal mucosa, and tongue normal  NECK: supple, thyroid normal size, non-tender, without nodularity LYMPH:  no palpable lymphadenopathy in the cervical, axillary or inguinal LUNGS: clear to auscultation and percussion with normal breathing effort HEART: regular rate & rhythm and no murmurs and no lower extremity edema ABDOMEN:abdomen soft, non-tender and normal bowel sounds MUSCULOSKELETAL:no cyanosis of digits and no clubbing  NEURO: alert & oriented x 3 with fluent speech, no focal motor/sensory deficits EXTREMITIES: No lower extremity edema   LABORATORY DATA:  I have reviewed the data as listed CMP Latest Ref Rng & Units 10/30/2017 10/16/2017 10/09/2017  Glucose 70 - 140 mg/dL 93 90 93  BUN 7 - 26 mg/dL 7 7.3 13.5  Creatinine 0.60 - 1.10 mg/dL 0.69 0.7 0.8  Sodium 136 - 145 mmol/L 141 140 134(L)  Potassium 3.3 - 4.7 mmol/L 3.6 3.9 4.2  Chloride 98 - 109 mmol/L 106 - -  CO2 22 - 29 mmol/L _0 Calcium 8.4 - 10.4 mg/dL 8.9 9.0 9.2  Total Protein 6.4 - 8.3 g/dL 6.7 6.8 7.0  Total Bilirubin 0.2 - 1.2 mg/dL 0.4 0.29 1.04  Alkaline Phos 40 - 150 U/L 63 60 59  AST 5 - 34 U/L _1 ALT 0 - 55 U/L _2 Lab Results  Component Value Date   WBC 8.6 11/13/2017   HGB 10.2 (L) 11/13/2017   HCT 31.0 (L) 11/13/2017   MCV 91.2 11/13/2017   PLT 240 11/13/2017   NEUTROABS 7.1 (H) 11/13/2017    ASSESSMENT & PLAN:  Malignant neoplasm of upper-outer quadrant of left breast in female, estrogen receptor positive ([place]) 08/29/2017: Screening detected left breast mass 4 cm at 12 o'clock position, 1.4 cm lymph node the axilla, biopsy of the mass and the lymph nodes were positive for IDC with DCIS grade 3 ER 70% PR 0%, KI 2%, HER-2 negative ratio 1.77, T2N1 stage IIIa clinical stage AJCC 8  Mammaprinthigh risk luminal type B. Average 10-year risk of recurrence  untreated 29%; with chemotherapy and hormonal therapy distant metastasis free interval at 5 years 94.6%  09/11/2017 breast [id number] breast malignancy with associated linear NME extending posteriorly as well as satellite nodules measures up to 5.3 cm, at least 4 abnormal left axillary lymph nodes  Recommendation: CT scans negative for metastatic disease 1.Neoadjuvant chemotherapy with dose dense Adriamycin and Cytoxan followed by Taxol x12 2. Followed by breast conserving surgery and targeted lymph node dissection 3. Followed by radiation 4. Follow-up antiestrogen therapy -------------------------------------------------------------------- Current treatment: Cycle4day1neoadjuvant dose dense Adriamycin and Cytoxan Echocardiogram 09/24/2017: EF 55-60% Closely monitoring for chemotherapy toxicities.  Chemo Toxicities: 1.Nausea for 2 days after chemotherapy which got better after week reducedthe chemotherapy with cycle 2  2.Severe fatigue related to chemotherapy 3. Anemia due to chemotherapy: Being monitored   Return to clinic in2weeksforcycle1 Taxol     I spent 25 minutes talking to the patient of which more than half was spent in counseling and coordination  of care.  No orders of the defined types were placed in this encounter.  The patient has a good understanding of the overall plan. she agrees with it. she will call with any problems that may develop before the next visit here.   Harriette Ohara, MD 11/13/17

## 2017-11-15 ENCOUNTER — Telehealth: Payer: Self-pay | Admitting: Hematology and Oncology

## 2017-11-15 NOTE — Telephone Encounter (Signed)
No 12/3 los.   

## 2017-11-21 DIAGNOSIS — Z131 Encounter for screening for diabetes mellitus: Secondary | ICD-10-CM | POA: Diagnosis not present

## 2017-11-21 DIAGNOSIS — Z0000 Encounter for general adult medical examination without abnormal findings: Secondary | ICD-10-CM | POA: Diagnosis not present

## 2017-11-21 DIAGNOSIS — Z136 Encounter for screening for cardiovascular disorders: Secondary | ICD-10-CM | POA: Diagnosis not present

## 2017-11-27 ENCOUNTER — Inpatient Hospital Stay: Payer: BLUE CROSS/BLUE SHIELD | Attending: Hematology and Oncology

## 2017-11-27 ENCOUNTER — Encounter: Payer: Self-pay | Admitting: Adult Health

## 2017-11-27 ENCOUNTER — Inpatient Hospital Stay (HOSPITAL_BASED_OUTPATIENT_CLINIC_OR_DEPARTMENT_OTHER): Payer: BLUE CROSS/BLUE SHIELD | Admitting: Adult Health

## 2017-11-27 ENCOUNTER — Inpatient Hospital Stay: Payer: BLUE CROSS/BLUE SHIELD

## 2017-11-27 VITALS — BP 110/79 | HR 90 | Temp 98.4°F | Resp 17 | Ht 66.0 in | Wt 129.9 lb

## 2017-11-27 VITALS — BP 100/71 | HR 100 | Temp 98.7°F | Resp 16

## 2017-11-27 DIAGNOSIS — Z5111 Encounter for antineoplastic chemotherapy: Secondary | ICD-10-CM | POA: Insufficient documentation

## 2017-11-27 DIAGNOSIS — Z170 Estrogen receptor positive status [ER+]: Secondary | ICD-10-CM

## 2017-11-27 DIAGNOSIS — Z923 Personal history of irradiation: Secondary | ICD-10-CM | POA: Insufficient documentation

## 2017-11-27 DIAGNOSIS — C50412 Malignant neoplasm of upper-outer quadrant of left female breast: Secondary | ICD-10-CM

## 2017-11-27 DIAGNOSIS — D649 Anemia, unspecified: Secondary | ICD-10-CM | POA: Insufficient documentation

## 2017-11-27 DIAGNOSIS — C778 Secondary and unspecified malignant neoplasm of lymph nodes of multiple regions: Secondary | ICD-10-CM | POA: Insufficient documentation

## 2017-11-27 DIAGNOSIS — Z9049 Acquired absence of other specified parts of digestive tract: Secondary | ICD-10-CM | POA: Diagnosis not present

## 2017-11-27 DIAGNOSIS — F419 Anxiety disorder, unspecified: Secondary | ICD-10-CM | POA: Insufficient documentation

## 2017-11-27 DIAGNOSIS — Z95828 Presence of other vascular implants and grafts: Secondary | ICD-10-CM

## 2017-11-27 LAB — CBC WITH DIFFERENTIAL/PLATELET
Basophils Absolute: 0 10*3/uL (ref 0.0–0.1)
Basophils Relative: 1 %
Eosinophils Absolute: 0 10*3/uL (ref 0.0–0.5)
Eosinophils Relative: 0 %
HCT: 26.3 % — ABNORMAL LOW (ref 34.8–46.6)
HEMOGLOBIN: 8.9 g/dL — AB (ref 11.6–15.9)
LYMPHS ABS: 0.5 10*3/uL — AB (ref 0.9–3.3)
LYMPHS PCT: 9 %
MCH: 30.9 pg (ref 25.1–34.0)
MCHC: 34 g/dL (ref 31.5–36.0)
MCV: 90.8 fL (ref 79.5–101.0)
Monocytes Absolute: 0.5 10*3/uL (ref 0.1–0.9)
Monocytes Relative: 10 %
NEUTROS PCT: 80 %
Neutro Abs: 4 10*3/uL (ref 1.5–6.5)
Platelets: 174 10*3/uL (ref 145–400)
RBC: 2.9 MIL/uL — AB (ref 3.70–5.45)
RDW: 19.3 % — ABNORMAL HIGH (ref 11.2–14.5)
WBC: 4.9 10*3/uL (ref 3.9–10.3)

## 2017-11-27 LAB — COMPREHENSIVE METABOLIC PANEL
ALK PHOS: 63 U/L (ref 40–150)
ALT: 7 U/L (ref 0–55)
AST: 10 U/L (ref 5–34)
Albumin: 3.6 g/dL (ref 3.5–5.0)
Anion gap: 8 (ref 3–11)
BILIRUBIN TOTAL: 0.3 mg/dL (ref 0.2–1.2)
BUN: 6 mg/dL — ABNORMAL LOW (ref 7–26)
CALCIUM: 8.7 mg/dL (ref 8.4–10.4)
CO2: 28 mmol/L (ref 22–29)
CREATININE: 0.61 mg/dL (ref 0.60–1.10)
Chloride: 106 mmol/L (ref 98–109)
Glucose, Bld: 87 mg/dL (ref 70–140)
Potassium: 3.7 mmol/L (ref 3.5–5.1)
Sodium: 142 mmol/L (ref 136–145)
Total Protein: 6.2 g/dL — ABNORMAL LOW (ref 6.4–8.3)

## 2017-11-27 MED ORDER — SODIUM CHLORIDE 0.9 % IV SOLN
20.0000 mg | Freq: Once | INTRAVENOUS | Status: AC
  Administered 2017-11-27: 20 mg via INTRAVENOUS
  Filled 2017-11-27: qty 2

## 2017-11-27 MED ORDER — SODIUM CHLORIDE 0.9% FLUSH
10.0000 mL | Freq: Once | INTRAVENOUS | Status: AC
  Administered 2017-11-27: 10 mL
  Filled 2017-11-27: qty 10

## 2017-11-27 MED ORDER — FAMOTIDINE IN NACL 20-0.9 MG/50ML-% IV SOLN
20.0000 mg | Freq: Once | INTRAVENOUS | Status: AC
  Administered 2017-11-27: 20 mg via INTRAVENOUS

## 2017-11-27 MED ORDER — DIPHENHYDRAMINE HCL 50 MG/ML IJ SOLN
INTRAMUSCULAR | Status: AC
  Filled 2017-11-27: qty 1

## 2017-11-27 MED ORDER — HEPARIN SOD (PORK) LOCK FLUSH 100 UNIT/ML IV SOLN
500.0000 [IU] | Freq: Once | INTRAVENOUS | Status: AC | PRN
  Administered 2017-11-27: 500 [IU]
  Filled 2017-11-27: qty 5

## 2017-11-27 MED ORDER — SODIUM CHLORIDE 0.9% FLUSH
10.0000 mL | INTRAVENOUS | Status: DC | PRN
Start: 2017-11-27 — End: 2017-11-27
  Administered 2017-11-27: 10 mL
  Filled 2017-11-27: qty 10

## 2017-11-27 MED ORDER — SODIUM CHLORIDE 0.9 % IV SOLN
Freq: Once | INTRAVENOUS | Status: AC
  Administered 2017-11-27: 10:00:00 via INTRAVENOUS

## 2017-11-27 MED ORDER — DIPHENHYDRAMINE HCL 50 MG/ML IJ SOLN
50.0000 mg | Freq: Once | INTRAMUSCULAR | Status: AC
  Administered 2017-11-27: 50 mg via INTRAVENOUS

## 2017-11-27 MED ORDER — FAMOTIDINE IN NACL 20-0.9 MG/50ML-% IV SOLN
INTRAVENOUS | Status: AC
  Filled 2017-11-27: qty 50

## 2017-11-27 MED ORDER — SODIUM CHLORIDE 0.9 % IV SOLN
80.0000 mg/m2 | Freq: Once | INTRAVENOUS | Status: AC
  Administered 2017-11-27: 138 mg via INTRAVENOUS
  Filled 2017-11-27: qty 23

## 2017-11-27 NOTE — Assessment & Plan Note (Addendum)
08/29/2017: Screening detected left breast mass 4 cm at 12 o'clock position, 1.4 cm lymph node the axilla, biopsy of the mass and the lymph nodes were positive for IDC with DCIS grade 3 ER 70% PR 0%, KI 2%, HER-2 negative ratio 1.77, T2N1 stage IIIa clinical stage AJCC 8  Mammaprinthigh risk luminal type B. Average 10-year risk of recurrence untreated 29%; with chemotherapy and hormonal therapy distant metastasis free interval at 5 years 94.6%  09/11/2017 breast [id number] breast malignancy with associated linear NME extending posteriorly as well as satellite nodules measures up to 5.3 cm, at least 4 abnormal left axillary lymph nodes  Recommendation: CT scans negative for metastatic disease 1.Neoadjuvant chemotherapy with dose dense Adriamycin and Cytoxan followed by Taxol x12 2. Followed by breast conserving surgery and targeted lymph node dissection 3. Followed by radiation 4. Follow-up antiestrogen therapy -------------------------------------------------------------------- Current treatment: Cycle1 Taxol neoadjuvantly Echocardiogram 09/24/2017: EF 55-60% Closely monitoring for chemotherapy toxicities.   Tonya Jackson is doing well today.  I reviewed possible adverse effects of Taxol with her today.  I reviewed with her how she should take her anti emetics.  She is slightly anemic today.  I gave her a list of iron rich foods to eat.  She will proceed with treatment today.  She will return in one week for labs, f/u with Dr. Lindi Adie, and her second dose of Taxol.

## 2017-11-27 NOTE — Patient Instructions (Signed)
[facility] Discharge Instructions for Patients Receiving Chemotherapy  Today you received the following chemotherapy agents Taxol  To help prevent nausea and vomiting after your treatment, we encourage you to take your nausea medication as directed   If you develop nausea and vomiting that is not controlled by your nausea medication, call the clinic.   BELOW ARE SYMPTOMS THAT SHOULD BE REPORTED IMMEDIATELY:  *FEVER GREATER THAN 100.5 F  *CHILLS WITH OR WITHOUT FEVER  NAUSEA AND VOMITING THAT IS NOT CONTROLLED WITH YOUR NAUSEA MEDICATION  *UNUSUAL SHORTNESS OF BREATH  *UNUSUAL BRUISING OR BLEEDING  TENDERNESS IN MOUTH AND THROAT WITH OR WITHOUT PRESENCE OF ULCERS  *URINARY PROBLEMS  *BOWEL PROBLEMS  UNUSUAL RASH Items with * indicate a potential emergency and should be followed up as soon as possible.  Feel free to call the clinic should you have any questions or concerns. The clinic phone number is [phone].  Please show the CHEMO ALERT CARD at check-in to the Emergency Department and triage nurse.   Paclitaxel injection What is this medicine? PACLITAXEL (PAK li TAX el) is a chemotherapy drug. It targets fast dividing cells, like cancer cells, and causes these cells to die. This medicine is used to treat ovarian cancer, breast cancer, and other cancers. This medicine may be used for other purposes; ask your health care provider or pharmacist if you have questions. COMMON BRAND NAME(S): Onxol, Taxol What should I tell my health care provider before I take this medicine? They need to know if you have any of these conditions: -blood disorders -irregular heartbeat -infection (especially a virus infection such as chickenpox, cold sores, or herpes) -liver disease -previous or ongoing radiation therapy -an unusual or allergic reaction to paclitaxel, alcohol, polyoxyethylated castor oil, other chemotherapy agents, other medicines, foods, dyes, or  preservatives -pregnant or trying to get pregnant -breast-feeding How should I use this medicine? This drug is given as an infusion into a vein. It is administered in a hospital or clinic by a specially trained health care professional. Talk to your pediatrician regarding the use of this medicine in children. Special care may be needed. Overdosage: If you think you have taken too much of this medicine contact a poison control center or emergency room at once. NOTE: This medicine is only for you. Do not share this medicine with others. What if I miss a dose? It is important not to miss your dose. Call your doctor or health care professional if you are unable to keep an appointment. What may interact with this medicine? Do not take this medicine with any of the following medications: -disulfiram -metronidazole This medicine may also interact with the following medications: -cyclosporine -diazepam -ketoconazole -medicines to increase blood counts like filgrastim, pegfilgrastim, sargramostim -other chemotherapy drugs like cisplatin, doxorubicin, epirubicin, etoposide, teniposide, vincristine -quinidine -testosterone -vaccines -verapamil Talk to your doctor or health care professional before taking any of these medicines: -acetaminophen -aspirin -ibuprofen -ketoprofen -naproxen This list may not describe all possible interactions. Give your health care provider a list of all the medicines, herbs, non-prescription drugs, or dietary supplements you use. Also tell them if you smoke, drink alcohol, or use illegal drugs. Some items may interact with your medicine. What should I watch for while using this medicine? Your condition will be monitored carefully while you are receiving this medicine. You will need important blood work done while you are taking this medicine. This medicine can cause serious allergic reactions. To reduce your risk you will need to   take other medicine(s) before  treatment with this medicine. If you experience allergic reactions like skin rash, itching or hives, swelling of the face, lips, or tongue, tell your doctor or health care professional right away. In some cases, you may be given additional medicines to help with side effects. Follow all directions for their use. This drug may make you feel generally unwell. This is not uncommon, as chemotherapy can affect healthy cells as well as cancer cells. Report any side effects. Continue your course of treatment even though you feel ill unless your doctor tells you to stop. Call your doctor or health care professional for advice if you get a fever, chills or sore throat, or other symptoms of a cold or flu. Do not treat yourself. This drug decreases your body's ability to fight infections. Try to avoid being around people who are sick. This medicine may increase your risk to bruise or bleed. Call your doctor or health care professional if you notice any unusual bleeding. Be careful brushing and flossing your teeth or using a toothpick because you may get an infection or bleed more easily. If you have any dental work done, tell your dentist you are receiving this medicine. Avoid taking products that contain aspirin, acetaminophen, ibuprofen, naproxen, or ketoprofen unless instructed by your doctor. These medicines may hide a fever. Do not become pregnant while taking this medicine. Women should inform their doctor if they wish to become pregnant or think they might be pregnant. There is a potential for serious side effects to an unborn child. Talk to your health care professional or pharmacist for more information. Do not breast-feed an infant while taking this medicine. Men are advised not to father a child while receiving this medicine. This product may contain alcohol. Ask your pharmacist or healthcare provider if this medicine contains alcohol. Be sure to tell all healthcare providers you are taking this medicine.  Certain medicines, like metronidazole and disulfiram, can cause an unpleasant reaction when taken with alcohol. The reaction includes flushing, headache, nausea, vomiting, sweating, and increased thirst. The reaction can last from 30 minutes to several hours. What side effects may I notice from receiving this medicine? Side effects that you should report to your doctor or health care professional as soon as possible: -allergic reactions like skin rash, itching or hives, swelling of the face, lips, or tongue -low blood counts - This drug may decrease the number of white blood cells, red blood cells and platelets. You may be at increased risk for infections and bleeding. -signs of infection - fever or chills, cough, sore throat, pain or difficulty passing urine -signs of decreased platelets or bleeding - bruising, pinpoint red spots on the skin, black, tarry stools, nosebleeds -signs of decreased red blood cells - unusually weak or tired, fainting spells, lightheadedness -breathing problems -chest pain -high or low blood pressure -mouth sores -nausea and vomiting -pain, swelling, redness or irritation at the injection site -pain, tingling, numbness in the hands or feet -slow or irregular heartbeat -swelling of the ankle, feet, hands Side effects that usually do not require medical attention (report to your doctor or health care professional if they continue or are bothersome): -bone pain -complete hair loss including hair on your head, underarms, pubic hair, eyebrows, and eyelashes -changes in the color of fingernails -diarrhea -loosening of the fingernails -loss of appetite -muscle or joint pain -red flush to skin -sweating This list may not describe all possible side effects. Call your doctor for medical advice about   side effects. You may report side effects to FDA at 1-800-FDA-1088. Where should I keep my medicine? This drug is given in a hospital or clinic and will not be stored at  home. NOTE: This sheet is a summary. It may not cover all possible information. If you have questions about this medicine, talk to your doctor, pharmacist, or health care provider.  2018 Elsevier/Gold Standard (2015-08-09 19:58:00)   

## 2017-11-27 NOTE — Patient Instructions (Signed)
Iron-Rich Diet Iron is a mineral that helps your body to produce hemoglobin. Hemoglobin is a protein in your red blood cells that carries oxygen to your body's tissues. Eating too little iron may cause you to feel weak and tired, and it can increase your risk for infection. Eating enough iron is necessary for your body's metabolism, muscle function, and nervous system. Iron is naturally found in many foods. It can also be added to foods or fortified in foods. There are two types of dietary iron:  Heme iron. Heme iron is absorbed by the body more easily than nonheme iron. Heme iron is found in meat, poultry, and fish.  Nonheme iron. Nonheme iron is found in dietary supplements, iron-fortified grains, beans, and vegetables.  You may need to follow an iron-rich diet if:  You have been diagnosed with iron deficiency or iron-deficiency anemia.  You have a condition that prevents you from absorbing dietary iron, such as: ? Infection in your intestines. ? Celiac disease. This involves long-lasting (chronic) inflammation of your intestines.  You do not eat enough iron.  You eat a diet that is high in foods that impair iron absorption.  You have lost a lot of blood.  You have heavy bleeding during your menstrual cycle.  You are pregnant.  What is my plan? Your health care provider may help you to determine how much iron you need per day based on your condition. Generally, when a person consumes sufficient amounts of iron in the diet, the following iron needs are met:  Men. ? 14-18 years old: 11 mg per day. ? 19-50 years old: 8 mg per day.  Women. ? 14-18 years old: 15 mg per day. ? 19-50 years old: 18 mg per day. ? Over 50 years old: 8 mg per day. ? Pregnant women: 27 mg per day. ? Breastfeeding women: 9 mg per day.  What do I need to know about an iron-rich diet?  Eat fresh fruits and vegetables that are high in vitamin C along with foods that are high in iron. This will help  increase the amount of iron that your body absorbs from food, especially with foods containing nonheme iron. Foods that are high in vitamin C include oranges, peppers, tomatoes, and mango.  Take iron supplements only as directed by your health care provider. Overdose of iron can be life-threatening. If you were prescribed iron supplements, take them with orange juice or a vitamin C supplement.  Cook foods in pots and pans that are made from iron.  Eat nonheme iron-containing foods alongside foods that are high in heme iron. This helps to improve your iron absorption.  Certain foods and drinks contain compounds that impair iron absorption. Avoid eating these foods in the same meal as iron-rich foods or with iron supplements. These include: ? Coffee, black tea, and red wine. ? Milk, dairy products, and foods that are high in calcium. ? Beans, soybeans, and peas. ? Whole grains.  When eating foods that contain both nonheme iron and compounds that impair iron absorption, follow these tips to absorb iron better. ? Soak beans overnight before cooking. ? Soak whole grains overnight and drain them before using. ? Ferment flours before baking, such as using yeast in bread dough. What foods can I eat? Grains Iron-fortified breakfast cereal. Iron-fortified whole-wheat bread. Enriched rice. Sprouted grains. Vegetables Spinach. Potatoes with skin. Green peas. Broccoli. Red and green bell peppers. Fermented vegetables. Fruits Prunes. Raisins. Oranges. Strawberries. Mango. Grapefruit. Meats and Other Protein Sources   Beef liver. Oysters. Beef. Shrimp. [place]. Chicken. [place]. Sardines. Chickpeas. Nuts. Tofu. Beverages Tomato juice. Fresh orange juice. Prune juice. Hibiscus tea. Fortified instant breakfast shakes. Condiments Tahini. Fermented soy sauce. Sweets and Desserts Black-strap molasses. Other Wheat germ. The items listed above may not be a complete list of recommended foods or beverages.  Contact your dietitian for more options. What foods are not recommended? Grains Whole grains. Bran cereal. Bran flour. Oats. Vegetables Artichokes. Brussels sprouts. Kale. Fruits Blueberries. Raspberries. Strawberries. Figs. Meats and Other Protein Sources Soybeans. Products made from soy protein. Dairy Milk. Cream. Cheese. Yogurt. Cottage cheese. Beverages Coffee. Black tea. Red wine. Sweets and Desserts Cocoa. Chocolate. Ice cream. Other Basil. Oregano. Parsley. The items listed above may not be a complete list of foods and beverages to avoid. Contact your dietitian for more information. This information is not intended to replace advice given to you by your health care provider. Make sure you discuss any questions you have with your health care provider. Document Released: 05/22/2005 Document Revised: 04/27/2016 Document Reviewed: 05/05/2014 Elsevier Interactive Patient Education  [employer].

## 2017-11-27 NOTE — Progress Notes (Signed)
[facility] Follow up:    Tonya Austin, MD [place] [address] [address]   DIAGNOSIS: Cancer Staging Malignant neoplasm of upper-outer quadrant of left breast in female, estrogen receptor positive ([place]) Staging form: Breast, AJCC 8th Edition - Clinical stage from 09/04/2017: Stage IIIA (cT2, cN1, cM0, G3, ER: Positive, PR: Negative, HER2: Negative) - Unsigned Staging comments: Staged at breast conference on 11.14.18   SUMMARY OF ONCOLOGIC HISTORY:   Malignant neoplasm of upper-outer quadrant of left breast in female, estrogen receptor positive ([place])   08/29/2017 Initial Diagnosis    Screening detected left breast mass 4 cm at 12 o'clock position, 1.4 cm lymph node the axilla, biopsy of the mass and the lymph nodes were positive for IDC with DCIS grade 3 ER 70% PR 0%, KI 2%, HER-2 negative ratio 1.77, T2N1 stage IIIa clinical stage AJCC 8      09/11/2017 Breast MRI    Left breast malignancy with associated linear NME extending posteriorly as well as satellite nodules measures up to 5.3 cm, at least 4 abnormal left axillary lymph nodes      09/11/2017 Imaging    CT CAP and Bone scan negative for metastatic disease      09/18/2017 Miscellaneous    Mammaprint high risk luminal type B.  Average 10-year risk of recurrence untreated 29%; with chemotherapy and hormonal therapy distant metastasis free interval at 5 years 94.6%      10/02/2017 -  Neo-Adjuvant Chemotherapy    Neoadjuvant chemotherapy with dose dense Adriamycin and Cytoxan x4 followed by Taxol weekly x12       CURRENT THERAPY: Taxol week 1  INTERVAL HISTORY: Tonya Jackson 52 y.o. female returns for evaluation prior to receiving Taxol today.  She is slightly anxious.  She denies any issues today.  She is feeling well.     Patient Active Problem List   Diagnosis Date Noted  . Port-A-Cath in place 10/16/2017  . Malignant neoplasm of upper-outer quadrant of left breast  in female, estrogen receptor positive ([place]) 09/03/2017    is allergic to other.  MEDICAL HISTORY: Past Medical History:  Diagnosis Date  . Abnormal Pap smear 1998  . Anemia 2003  . Cancer ([place]) 08/2017   Left breast cancer  . Dysuria 2011  . H/O cyst of breast 2010   Right  . H/O diarrhea 2009  . H/O dysmenorrhea 2003  . H/O hematuria 2011  . H/O varicella   . H/O vulvovaginitis 2010  . H/O: menorrhagia 2003  . History of measles, mumps, or rubella   . Hx: UTI (urinary tract infection) 2008  . Menses, irregular 2003  . Monilial vaginitis 2003  . Oligomenorrhea 11/05/2011  . Perimenopausal symptoms 11/05/2011  . Vulvar inflammation 2003  . Yeast infection     SURGICAL HISTORY: Past Surgical History:  Procedure Laterality Date  . APPENDECTOMY  1982   An open   . PORTACATH PLACEMENT Right 09/27/2017   Procedure: INSERTION PORT-A-CATH;  Surgeon: Jovita Kussmaul, MD;  Location: [facility];  Service: General;  Laterality: Right;  . WISDOM TOOTH EXTRACTION      SOCIAL HISTORY: Social History   Socioeconomic History  . Marital status: Married    Spouse name: Not on file  . Number of children: Not on file  . Years of education: Not on file  . Highest education level: Not on file  Social Needs  . Financial resource strain: Not on file  .  Food insecurity - worry: Not on file  . Food insecurity - inability: Not on file  . Transportation needs - medical: Not on file  . Transportation needs - non-medical: Not on file  Occupational History  . Not on file  Tobacco Use  . Smoking status: Never Smoker  . Smokeless tobacco: Never Used  Substance and Sexual Activity  . Alcohol use: No  . Drug use: No  . Sexual activity: Yes    Birth control/protection: Post-menopausal  Other Topics Concern  . Not on file  Social History Narrative  . Not on file    FAMILY HISTORY: Family History  Problem Relation Age of Onset  . Diabetes Father   . Hypertension Father    . Hypertension Other     Review of Systems  Constitutional: Negative for appetite change, chills, fatigue, fever and unexpected weight change.  HENT:   Negative for hearing loss, lump/mass and trouble swallowing.   Eyes: Negative for eye problems and icterus.  Respiratory: Negative for chest tightness, cough and shortness of breath.   Cardiovascular: Negative for chest pain, leg swelling and palpitations.  Gastrointestinal: Negative for abdominal distention, abdominal pain, constipation, diarrhea, nausea and vomiting.  Endocrine: Negative for hot flashes.  Genitourinary: Negative for difficulty urinating.   Musculoskeletal: Negative for arthralgias.  Skin: Negative for itching and rash.  Neurological: Negative for dizziness, extremity weakness, headaches and numbness.  Hematological: Negative for adenopathy. Does not bruise/bleed easily.  Psychiatric/Behavioral: Negative for depression. The patient is not nervous/anxious.       PHYSICAL EXAMINATION  ECOG PERFORMANCE STATUS: 0 - Asymptomatic  Vitals:   11/27/17 0916  BP: 110/79  Pulse: 90  Resp: 17  Temp: 98.4 F (36.9 C)  SpO2: 100%    Physical Exam  Constitutional: She is oriented to person, place, and time and well-developed, well-nourished, and in no distress.  HENT:  Head: Normocephalic and atraumatic.  Mouth/Throat: Oropharynx is clear and moist. No oropharyngeal exudate.  Eyes: Pupils are equal, round, and reactive to light. No scleral icterus.  Neck: Neck supple.  Cardiovascular: Normal rate, regular rhythm and normal heart sounds.  Pulmonary/Chest: Effort normal and breath sounds normal. No respiratory distress. She has no wheezes. She has no rales.  Abdominal: Soft. Bowel sounds are normal. She exhibits no distension and no mass. There is no tenderness. There is no rebound and no guarding.  Musculoskeletal: She exhibits no edema.  Lymphadenopathy:    She has no cervical adenopathy.  Neurological: She is  alert and oriented to person, place, and time.  Skin: Skin is warm and dry. No rash noted. No erythema.  Psychiatric: Mood and affect normal.    LABORATORY DATA:  CBC    Component Value Date/Time   WBC 4.9 11/27/2017 0836   RBC 2.90 (L) 11/27/2017 0836   HGB 8.9 (L) 11/27/2017 0836   HGB 11.5 (L) 10/16/2017 0911   HCT 26.3 (L) 11/27/2017 0836   HCT 35.0 10/16/2017 0911   PLT 174 11/27/2017 0836   PLT 269 10/16/2017 0911   MCV 90.8 11/27/2017 0836   MCV 89.3 10/16/2017 0911   MCH 30.9 11/27/2017 0836   MCHC 34.0 11/27/2017 0836   RDW 19.3 (H) 11/27/2017 0836   RDW 13.3 10/16/2017 0911   LYMPHSABS 0.5 (L) 11/27/2017 0836   LYMPHSABS 1.2 10/16/2017 0911   MONOABS 0.5 11/27/2017 0836   MONOABS 0.6 10/16/2017 0911   EOSABS 0.0 11/27/2017 0836   EOSABS 0.0 10/16/2017 0911   BASOSABS 0.0 11/27/2017 1610  BASOSABS 0.0 10/16/2017 0911    CMP     Component Value Date/Time   NA 142 11/27/2017 0836   NA 140 10/16/2017 0911   K 3.7 11/27/2017 0836   K 3.9 10/16/2017 0911   CL 106 11/27/2017 0836   CO2 28 11/27/2017 0836   CO2 26 10/16/2017 0911   GLUCOSE 87 11/27/2017 0836   GLUCOSE 90 10/16/2017 0911   BUN 6 (L) 11/27/2017 0836   BUN 7.3 10/16/2017 0911   CREATININE 0.61 11/27/2017 0836   CREATININE 0.7 10/16/2017 0911   CALCIUM 8.7 11/27/2017 0836   CALCIUM 9.0 10/16/2017 0911   PROT 6.2 (L) 11/27/2017 0836   PROT 6.8 10/16/2017 0911   ALBUMIN 3.6 11/27/2017 0836   ALBUMIN 3.9 10/16/2017 0911   AST 10 11/27/2017 0836   AST 14 10/16/2017 0911   ALT 7 11/27/2017 0836   ALT 11 10/16/2017 0911   ALKPHOS 63 11/27/2017 0836   ALKPHOS 60 10/16/2017 0911   BILITOT 0.3 11/27/2017 0836   BILITOT 0.29 10/16/2017 0911   GFRNONAA >60 11/27/2017 0836   GFRAA >60 11/27/2017 0836       PENDING LABS:   RADIOGRAPHIC STUDIES:  No results found.   PATHOLOGY:     ASSESSMENT and THERAPY PLAN:   Malignant neoplasm of upper-outer quadrant of left breast in female,  estrogen receptor positive ([place]) 08/29/2017: Screening detected left breast mass 4 cm at 12 o'clock position, 1.4 cm lymph node the axilla, biopsy of the mass and the lymph nodes were positive for IDC with DCIS grade 3 ER 70% PR 0%, KI 2%, HER-2 negative ratio 1.77, T2N1 stage IIIa clinical stage AJCC 8  Mammaprinthigh risk luminal type B. Average 10-year risk of recurrence untreated 29%; with chemotherapy and hormonal therapy distant metastasis free interval at 5 years 94.6%  09/11/2017 breast [id number] breast malignancy with associated linear NME extending posteriorly as well as satellite nodules measures up to 5.3 cm, at least 4 abnormal left axillary lymph nodes  Recommendation: CT scans negative for metastatic disease 1.Neoadjuvant chemotherapy with dose dense Adriamycin and Cytoxan followed by Taxol x12 2. Followed by breast conserving surgery and targeted lymph node dissection 3. Followed by radiation 4. Follow-up antiestrogen therapy -------------------------------------------------------------------- Current treatment: Cycle1 Taxol neoadjuvantly Echocardiogram 09/24/2017: EF 55-60% Closely monitoring for chemotherapy toxicities.   Tonya Jackson is doing well today.  I reviewed possible adverse effects of Taxol with her today.  I reviewed with her how she should take her anti emetics.  She is slightly anemic today.  I gave her a list of iron rich foods to eat.  She will proceed with treatment today.  She will return in one week for labs, f/u with Dr. Lindi Adie, and her second dose of Taxol.      All questions were answered. The patient knows to call the clinic with any problems, questions or concerns. We can certainly see the patient much sooner if necessary.  A total of (30) minutes of face-to-face time was spent with this patient with greater than 50% of that time in counseling and care-coordination.  This note was electronically signed. Scot Dock, NP 11/27/2017

## 2017-11-28 ENCOUNTER — Telehealth: Payer: Self-pay

## 2017-11-28 NOTE — Telephone Encounter (Signed)
Chemo follow up call made today with pt. Pt states she tolerated chemo well yesterday and has not had any issues except some mild flushing of the face. Pt denies rash, fever, itching, sob, n/v at this time.   Pt states that this occurred after [facility] treatments as well. Told pt that it could be due to her steroids that is causing her flushing, since she is getting a higher dose this time with taxol.  Pt voiced understanding and will monitor side effects and symptoms closely. Encourage pt to hydrate with fluids all throughout the day. Pt will do as instructed and will call with updates.  Told pt that if her flushing worsens, or other symptoms develops that are stated in her chemo card, she will need to go to ED. Pt verbalized understanding.

## 2017-12-04 ENCOUNTER — Inpatient Hospital Stay: Payer: BLUE CROSS/BLUE SHIELD

## 2017-12-04 ENCOUNTER — Inpatient Hospital Stay (HOSPITAL_BASED_OUTPATIENT_CLINIC_OR_DEPARTMENT_OTHER): Payer: BLUE CROSS/BLUE SHIELD | Admitting: Hematology and Oncology

## 2017-12-04 ENCOUNTER — Encounter: Payer: Self-pay | Admitting: *Deleted

## 2017-12-04 DIAGNOSIS — Z5111 Encounter for antineoplastic chemotherapy: Secondary | ICD-10-CM | POA: Diagnosis not present

## 2017-12-04 DIAGNOSIS — Z923 Personal history of irradiation: Secondary | ICD-10-CM

## 2017-12-04 DIAGNOSIS — Z170 Estrogen receptor positive status [ER+]: Principal | ICD-10-CM

## 2017-12-04 DIAGNOSIS — C778 Secondary and unspecified malignant neoplasm of lymph nodes of multiple regions: Secondary | ICD-10-CM | POA: Diagnosis not present

## 2017-12-04 DIAGNOSIS — C50412 Malignant neoplasm of upper-outer quadrant of left female breast: Secondary | ICD-10-CM

## 2017-12-04 DIAGNOSIS — F419 Anxiety disorder, unspecified: Secondary | ICD-10-CM

## 2017-12-04 DIAGNOSIS — Z9049 Acquired absence of other specified parts of digestive tract: Secondary | ICD-10-CM | POA: Diagnosis not present

## 2017-12-04 DIAGNOSIS — D649 Anemia, unspecified: Secondary | ICD-10-CM

## 2017-12-04 LAB — COMPREHENSIVE METABOLIC PANEL
ALBUMIN: 3.8 g/dL (ref 3.5–5.0)
ALT: 14 U/L (ref 0–55)
AST: 16 U/L (ref 5–34)
Alkaline Phosphatase: 54 U/L (ref 40–150)
Anion gap: 8 (ref 3–11)
BUN: 9 mg/dL (ref 7–26)
CHLORIDE: 105 mmol/L (ref 98–109)
CO2: 27 mmol/L (ref 22–29)
Calcium: 9.1 mg/dL (ref 8.4–10.4)
Creatinine, Ser: 0.69 mg/dL (ref 0.60–1.10)
GFR calc Af Amer: >60 mL/min (ref >60)
GLUCOSE: 91 mg/dL (ref 70–140)
Potassium: 4 mmol/L (ref 3.5–5.1)
SODIUM: 140 mmol/L (ref 136–145)
Total Bilirubin: 0.5 mg/dL (ref 0.2–1.2)
Total Protein: 6.6 g/dL (ref 6.4–8.3)

## 2017-12-04 LAB — CBC WITH DIFFERENTIAL/PLATELET
Basophils Absolute: 0 10*3/uL (ref 0.0–0.1)
Basophils Relative: 1 %
EOS PCT: 0 %
Eosinophils Absolute: 0 10*3/uL (ref 0.0–0.5)
HCT: 25.4 % — ABNORMAL LOW (ref 34.8–46.6)
Hemoglobin: 8.6 g/dL — ABNORMAL LOW (ref 11.6–15.9)
LYMPHS ABS: 0.4 10*3/uL — AB (ref 0.9–3.3)
Lymphocytes Relative: 13 %
MCH: 31.1 pg (ref 25.1–34.0)
MCHC: 33.8 g/dL (ref 31.5–36.0)
MCV: 92.2 fL (ref 79.5–101.0)
MONO ABS: 0.4 10*3/uL (ref 0.1–0.9)
MONOS PCT: 13 %
Neutro Abs: 2.5 10*3/uL (ref 1.5–6.5)
Neutrophils Relative %: 73 %
PLATELETS: 264 10*3/uL (ref 145–400)
RBC: 2.76 MIL/uL — AB (ref 3.70–5.45)
RDW: 20.1 % — ABNORMAL HIGH (ref 11.2–14.5)
WBC: 3.4 10*3/uL — ABNORMAL LOW (ref 3.9–10.3)

## 2017-12-04 MED ORDER — HEPARIN SOD (PORK) LOCK FLUSH 100 UNIT/ML IV SOLN
500.0000 [IU] | Freq: Once | INTRAVENOUS | Status: AC | PRN
  Administered 2017-12-04: 500 [IU]
  Filled 2017-12-04: qty 5

## 2017-12-04 MED ORDER — SODIUM CHLORIDE 0.9 % IV SOLN
20.0000 mg | Freq: Once | INTRAVENOUS | Status: AC
  Administered 2017-12-04: 20 mg via INTRAVENOUS
  Filled 2017-12-04: qty 2

## 2017-12-04 MED ORDER — DIPHENHYDRAMINE HCL 50 MG/ML IJ SOLN
INTRAMUSCULAR | Status: AC
Start: 2017-12-04 — End: 2017-12-04
  Filled 2017-12-04: qty 1

## 2017-12-04 MED ORDER — SODIUM CHLORIDE 0.9% FLUSH
10.0000 mL | INTRAVENOUS | Status: DC | PRN
  Administered 2017-12-04: 10 mL
  Filled 2017-12-04: qty 10

## 2017-12-04 MED ORDER — FAMOTIDINE IN NACL 20-0.9 MG/50ML-% IV SOLN: 20.0000 mg | Freq: Once | INTRAVENOUS | Status: DC

## 2017-12-04 MED ORDER — PACLITAXEL CHEMO INJECTION 300 MG/50ML
80.0000 mg/m2 | Freq: Once | INTRAVENOUS | Status: AC
  Administered 2017-12-04: 138 mg via INTRAVENOUS
  Filled 2017-12-04: qty 23

## 2017-12-04 MED ORDER — SODIUM CHLORIDE 0.9 % IV SOLN
Freq: Once | INTRAVENOUS | Status: AC
  Administered 2017-12-04: 13:00:00 via INTRAVENOUS

## 2017-12-04 MED ORDER — DIPHENHYDRAMINE HCL 50 MG/ML IJ SOLN
50.0000 mg | Freq: Once | INTRAMUSCULAR | Status: AC
  Administered 2017-12-04: 50 mg via INTRAVENOUS

## 2017-12-04 NOTE — Patient Instructions (Signed)
[facility] Discharge Instructions for Patients Receiving Chemotherapy  Today you received the following chemotherapy agents Taxol  To help prevent nausea and vomiting after your treatment, we encourage you to take your nausea medication as directed   If you develop nausea and vomiting that is not controlled by your nausea medication, call the clinic.   BELOW ARE SYMPTOMS THAT SHOULD BE REPORTED IMMEDIATELY:  *FEVER GREATER THAN 100.5 F  *CHILLS WITH OR WITHOUT FEVER  NAUSEA AND VOMITING THAT IS NOT CONTROLLED WITH YOUR NAUSEA MEDICATION  *UNUSUAL SHORTNESS OF BREATH  *UNUSUAL BRUISING OR BLEEDING  TENDERNESS IN MOUTH AND THROAT WITH OR WITHOUT PRESENCE OF ULCERS  *URINARY PROBLEMS  *BOWEL PROBLEMS  UNUSUAL RASH Items with * indicate a potential emergency and should be followed up as soon as possible.  Feel free to call the clinic should you have any questions or concerns. The clinic phone number is [phone].  Please show the CHEMO ALERT CARD at check-in to the Emergency Department and triage nurse.   Paclitaxel injection What is this medicine? PACLITAXEL (PAK li TAX el) is a chemotherapy drug. It targets fast dividing cells, like cancer cells, and causes these cells to die. This medicine is used to treat ovarian cancer, breast cancer, and other cancers. This medicine may be used for other purposes; ask your health care provider or pharmacist if you have questions. COMMON BRAND NAME(S): Onxol, Taxol What should I tell my health care provider before I take this medicine? They need to know if you have any of these conditions: -blood disorders -irregular heartbeat -infection (especially a virus infection such as chickenpox, cold sores, or herpes) -liver disease -previous or ongoing radiation therapy -an unusual or allergic reaction to paclitaxel, alcohol, polyoxyethylated castor oil, other chemotherapy agents, other medicines, foods, dyes, or  preservatives -pregnant or trying to get pregnant -breast-feeding How should I use this medicine? This drug is given as an infusion into a vein. It is administered in a hospital or clinic by a specially trained health care professional. Talk to your pediatrician regarding the use of this medicine in children. Special care may be needed. Overdosage: If you think you have taken too much of this medicine contact a poison control center or emergency room at once. NOTE: This medicine is only for you. Do not share this medicine with others. What if I miss a dose? It is important not to miss your dose. Call your doctor or health care professional if you are unable to keep an appointment. What may interact with this medicine? Do not take this medicine with any of the following medications: -disulfiram -metronidazole This medicine may also interact with the following medications: -cyclosporine -diazepam -ketoconazole -medicines to increase blood counts like filgrastim, pegfilgrastim, sargramostim -other chemotherapy drugs like cisplatin, doxorubicin, epirubicin, etoposide, teniposide, vincristine -quinidine -testosterone -vaccines -verapamil Talk to your doctor or health care professional before taking any of these medicines: -acetaminophen -aspirin -ibuprofen -ketoprofen -naproxen This list may not describe all possible interactions. Give your health care provider a list of all the medicines, herbs, non-prescription drugs, or dietary supplements you use. Also tell them if you smoke, drink alcohol, or use illegal drugs. Some items may interact with your medicine. What should I watch for while using this medicine? Your condition will be monitored carefully while you are receiving this medicine. You will need important blood work done while you are taking this medicine. This medicine can cause serious allergic reactions. To reduce your risk you will need to   take other medicine(s) before  treatment with this medicine. If you experience allergic reactions like skin rash, itching or hives, swelling of the face, lips, or tongue, tell your doctor or health care professional right away. In some cases, you may be given additional medicines to help with side effects. Follow all directions for their use. This drug may make you feel generally unwell. This is not uncommon, as chemotherapy can affect healthy cells as well as cancer cells. Report any side effects. Continue your course of treatment even though you feel ill unless your doctor tells you to stop. Call your doctor or health care professional for advice if you get a fever, chills or sore throat, or other symptoms of a cold or flu. Do not treat yourself. This drug decreases your body's ability to fight infections. Try to avoid being around people who are sick. This medicine may increase your risk to bruise or bleed. Call your doctor or health care professional if you notice any unusual bleeding. Be careful brushing and flossing your teeth or using a toothpick because you may get an infection or bleed more easily. If you have any dental work done, tell your dentist you are receiving this medicine. Avoid taking products that contain aspirin, acetaminophen, ibuprofen, naproxen, or ketoprofen unless instructed by your doctor. These medicines may hide a fever. Do not become pregnant while taking this medicine. Women should inform their doctor if they wish to become pregnant or think they might be pregnant. There is a potential for serious side effects to an unborn child. Talk to your health care professional or pharmacist for more information. Do not breast-feed an infant while taking this medicine. Men are advised not to father a child while receiving this medicine. This product may contain alcohol. Ask your pharmacist or healthcare provider if this medicine contains alcohol. Be sure to tell all healthcare providers you are taking this medicine.  Certain medicines, like metronidazole and disulfiram, can cause an unpleasant reaction when taken with alcohol. The reaction includes flushing, headache, nausea, vomiting, sweating, and increased thirst. The reaction can last from 30 minutes to several hours. What side effects may I notice from receiving this medicine? Side effects that you should report to your doctor or health care professional as soon as possible: -allergic reactions like skin rash, itching or hives, swelling of the face, lips, or tongue -low blood counts - This drug may decrease the number of white blood cells, red blood cells and platelets. You may be at increased risk for infections and bleeding. -signs of infection - fever or chills, cough, sore throat, pain or difficulty passing urine -signs of decreased platelets or bleeding - bruising, pinpoint red spots on the skin, black, tarry stools, nosebleeds -signs of decreased red blood cells - unusually weak or tired, fainting spells, lightheadedness -breathing problems -chest pain -high or low blood pressure -mouth sores -nausea and vomiting -pain, swelling, redness or irritation at the injection site -pain, tingling, numbness in the hands or feet -slow or irregular heartbeat -swelling of the ankle, feet, hands Side effects that usually do not require medical attention (report to your doctor or health care professional if they continue or are bothersome): -bone pain -complete hair loss including hair on your head, underarms, pubic hair, eyebrows, and eyelashes -changes in the color of fingernails -diarrhea -loosening of the fingernails -loss of appetite -muscle or joint pain -red flush to skin -sweating This list may not describe all possible side effects. Call your doctor for medical advice about   side effects. You may report side effects to FDA at 1-800-FDA-1088. Where should I keep my medicine? This drug is given in a hospital or clinic and will not be stored at  home. NOTE: This sheet is a summary. It may not cover all possible information. If you have questions about this medicine, talk to your doctor, pharmacist, or health care provider.  2018 Elsevier/Gold Standard (2015-08-09 19:58:00)   

## 2017-12-04 NOTE — Progress Notes (Signed)
Patient Care Team: [name], MD as PCP - General (Family Medicine)  DIAGNOSIS:  Encounter Diagnosis  Name Primary?  . Malignant neoplasm of upper-outer quadrant of left breast in female, estrogen receptor positive ([place])     SUMMARY OF ONCOLOGIC HISTORY:   Malignant neoplasm of upper-outer quadrant of left breast in female, estrogen receptor positive ([place])   08/29/2017 Initial Diagnosis    Screening detected left breast mass 4 cm at 12 o'clock position, 1.4 cm lymph node the axilla, biopsy of the mass and the lymph nodes were positive for IDC with DCIS grade 3 ER 70% PR 0%, KI 2%, HER-2 negative ratio 1.77, T2N1 stage IIIa clinical stage AJCC 8      09/11/2017 Breast MRI    Left breast malignancy with associated linear NME extending posteriorly as well as satellite nodules measures up to 5.3 cm, at least 4 abnormal left axillary lymph nodes      09/11/2017 Imaging    CT CAP and Bone scan negative for metastatic disease      09/18/2017 Miscellaneous    Mammaprint high risk luminal type B.  Average 10-year risk of recurrence untreated 29%; with chemotherapy and hormonal therapy distant metastasis free interval at 5 years 94.6%      10/02/2017 -  Neo-Adjuvant Chemotherapy    Neoadjuvant chemotherapy with dose dense Adriamycin and Cytoxan x4 followed by Taxol weekly x12       CHIEF COMPLIANT: Cycle 2 Taxol  INTERVAL HISTORY: Tonya Jackson is a 52 year old with above-mentioned history of left breast cancer who is currently neoadjuvant chemotherapy and today's cycle 2 of Taxol.  Overall she tolerated Taxol fairly well.  She did not have any nausea or vomiting.  Did not have any neuropathy symptoms.  REVIEW OF SYSTEMS:   Constitutional: Denies fevers, chills or abnormal weight loss Eyes: Denies blurriness of vision Ears, nose, mouth, throat, and face: Denies mucositis or sore throat Respiratory: Denies cough, dyspnea or wheezes Cardiovascular: Denies palpitation, chest  discomfort Gastrointestinal:  Denies nausea, heartburn or change in bowel habits Skin: Denies abnormal skin rashes Lymphatics: Denies new lymphadenopathy or easy bruising Neurological:Denies numbness, tingling or new weaknesses Behavioral/Psych: Mood is stable, no new changes  Extremities: No lower extremity edema Breast:  denies any pain or lumps or nodules in either breasts All other systems were reviewed with the patient and are negative.  I have reviewed the past medical history, past surgical history, social history and family history with the patient and they are unchanged from previous note.  ALLERGIES:  is allergic to other.  MEDICATIONS:  Current Outpatient Medications  Medication Sig Dispense Refill  . HYDROcodone-acetaminophen (NORCO/VICODIN) 5-325 MG tablet Take 1-2 tablets by mouth every 6 (six) hours as needed for moderate pain or severe pain. 15 tablet 0  . lidocaine-prilocaine (EMLA) cream Apply to affected area once 30 g 3  . LORazepam (ATIVAN) 0.5 MG tablet Take 1 tablet (0.5 mg total) by mouth at bedtime as needed (Nausea or vomiting). 30 tablet 0  . ondansetron (ZOFRAN) 8 MG tablet Take 1 tablet (8 mg total) by mouth 2 (two) times daily as needed. Start on the third day after chemotherapy. 30 tablet 1  . prochlorperazine (COMPAZINE) 10 MG tablet Take 1 tablet (10 mg total) by mouth every 6 (six) hours as needed (Nausea or vomiting). 30 tablet 1   No current facility-administered medications for this visit.     PHYSICAL EXAMINATION: ECOG PERFORMANCE STATUS: 1 - Symptomatic but completely ambulatory  There  were no vitals filed for this visit. There were no vitals filed for this visit.  GENERAL:alert, no distress and comfortable SKIN: skin color, texture, turgor are normal, no rashes or significant lesions EYES: normal, Conjunctiva are pink and non-injected, sclera clear OROPHARYNX:no exudate, no erythema and lips, buccal mucosa, and tongue normal  NECK: supple,  thyroid normal size, non-tender, without nodularity LYMPH:  no palpable lymphadenopathy in the cervical, axillary or inguinal LUNGS: clear to auscultation and percussion with normal breathing effort HEART: regular rate & rhythm and no murmurs and no lower extremity edema ABDOMEN:abdomen soft, non-tender and normal bowel sounds MUSCULOSKELETAL:no cyanosis of digits and no clubbing  NEURO: alert & oriented x 3 with fluent speech, no focal motor/sensory deficits EXTREMITIES: No lower extremity edema  LABORATORY DATA:  I have reviewed the data as listed CMP Latest Ref Rng & Units 11/27/2017 11/13/2017 10/30/2017  Glucose 70 - 140 mg/dL 87 120 93  BUN 7 - 26 mg/dL 6(L) 8 7  Creatinine 0.60 - 1.10 mg/dL 0.61 0.69 0.69  Sodium 136 - 145 mmol/L 142 142 141  Potassium 3.5 - 5.1 mmol/L 3.7 4.0 3.6  Chloride 98 - 109 mmol/L 106 105 106  CO2 22 - 29 mmol/L '28 29 27  '$ Calcium 8.4 - 10.4 mg/dL 8.7 9.0 8.9  Total Protein 6.4 - 8.3 g/dL 6.2(L) 6.6 6.7  Total Bilirubin 0.2 - 1.2 mg/dL 0.3 0.3 0.4  Alkaline Phos 40 - 150 U/L 63 69 63  AST 5 - 34 U/L '10 12 12  '$ ALT 0 - 55 U/L '7 14 12    '$ Lab Results  Component Value Date   WBC 3.4 (L) 12/04/2017   HGB 8.6 (L) 12/04/2017   HCT 25.4 (L) 12/04/2017   MCV 92.2 12/04/2017   PLT 264 12/04/2017   NEUTROABS 2.5 12/04/2017    ASSESSMENT & PLAN:  Malignant neoplasm of upper-outer quadrant of left breast in female, estrogen receptor positive ([place]) 08/29/2017: Screening detected left breast mass 4 cm at 12 o'clock position, 1.4 cm lymph node the axilla, biopsy of the mass and the lymph nodes were positive for IDC with DCIS grade 3 ER 70% PR 0%, KI 2%, HER-2 negative ratio 1.77, T2N1 stage IIIa clinical stage AJCC 8  Mammaprinthigh risk luminal type B. Average 10-year risk of recurrence untreated 29%; with chemotherapy and hormonal therapy distant metastasis free interval at 5 years 94.6%  09/11/2017 breast [id number] breast malignancy with associated linear  NME extending posteriorly as well as satellite nodules measures up to 5.3 cm, at least 4 abnormal left axillary lymph nodes  Recommendation: CT scans negative for metastatic disease 1.Neoadjuvant chemotherapy with dose dense Adriamycin and Cytoxan followed by Taxol x12 2. Followed by breast conserving surgery and targeted lymph node dissection 3. Followed by radiation 4. Follow-up antiestrogen therapy -------------------------------------------------------------------- Current treatment: Completed 4 cycles of dose dense Adriamycin and Cytoxan, today is cycle2 Taxol neoadjuvantly Echocardiogram 09/24/2017: EF 55-60% Closely monitoring for chemotherapy toxicities. Labs have been reviewed Return to clinic weekly for chemo and every 2 weeks for follow-up with  I spent 25 minutes talking to the patient of which more than half was spent in counseling and coordination of care.  No orders of the defined types were placed in this encounter.  The patient has a good understanding of the overall plan. she agrees with it. she will call with any problems that may develop before the next visit here.   Harriette Ohara, MD 12/04/17

## 2017-12-04 NOTE — Assessment & Plan Note (Signed)
08/29/2017: Screening detected left breast mass 4 cm at 12 o'clock position, 1.4 cm lymph node the axilla, biopsy of the mass and the lymph nodes were positive for IDC with DCIS grade 3 ER 70% PR 0%, KI 2%, HER-2 negative ratio 1.77, T2N1 stage IIIa clinical stage AJCC 8  Mammaprinthigh risk luminal type B. Average 10-year risk of recurrence untreated 29%; with chemotherapy and hormonal therapy distant metastasis free interval at 5 years 94.6%  09/11/2017 breast AQT:[id number] breast malignancy with associated linear NME extending posteriorly as well as satellite nodules measures up to 5.3 cm, at least 4 abnormal left axillary lymph nodes  Recommendation: CT scans negative for metastatic disease 1.Neoadjuvant chemotherapy with dose dense Adriamycin and Cytoxan followed by Taxol x12 2. Followed by breast conserving surgery and targeted lymph node dissection 3. Followed by radiation 4. Follow-up antiestrogen therapy -------------------------------------------------------------------- Current treatment: Completed 4 cycles of dose dense Adriamycin and Cytoxan, today is cycle2 Taxol neoadjuvantly Echocardiogram 09/24/2017: EF 55-60% Closely monitoring for chemotherapy toxicities. Labs have been reviewed Return to clinic Weekly for chemo and every 2 weeks for follow-up with

## 2017-12-11 ENCOUNTER — Inpatient Hospital Stay: Payer: BLUE CROSS/BLUE SHIELD

## 2017-12-11 VITALS — BP 101/64 | HR 86 | Temp 98.5°F | Resp 18

## 2017-12-11 DIAGNOSIS — Z170 Estrogen receptor positive status [ER+]: Secondary | ICD-10-CM | POA: Diagnosis not present

## 2017-12-11 DIAGNOSIS — Z5111 Encounter for antineoplastic chemotherapy: Secondary | ICD-10-CM | POA: Diagnosis not present

## 2017-12-11 DIAGNOSIS — Z9049 Acquired absence of other specified parts of digestive tract: Secondary | ICD-10-CM | POA: Diagnosis not present

## 2017-12-11 DIAGNOSIS — Z923 Personal history of irradiation: Secondary | ICD-10-CM | POA: Diagnosis not present

## 2017-12-11 DIAGNOSIS — F419 Anxiety disorder, unspecified: Secondary | ICD-10-CM | POA: Diagnosis not present

## 2017-12-11 DIAGNOSIS — Z95828 Presence of other vascular implants and grafts: Secondary | ICD-10-CM

## 2017-12-11 DIAGNOSIS — C50412 Malignant neoplasm of upper-outer quadrant of left female breast: Secondary | ICD-10-CM | POA: Diagnosis not present

## 2017-12-11 DIAGNOSIS — C778 Secondary and unspecified malignant neoplasm of lymph nodes of multiple regions: Secondary | ICD-10-CM | POA: Diagnosis not present

## 2017-12-11 DIAGNOSIS — D649 Anemia, unspecified: Secondary | ICD-10-CM | POA: Diagnosis not present

## 2017-12-11 LAB — COMPREHENSIVE METABOLIC PANEL
ALBUMIN: 3.7 g/dL (ref 3.5–5.0)
ALK PHOS: 51 U/L (ref 40–150)
ALT: 22 U/L (ref 0–55)
AST: 20 U/L (ref 5–34)
Anion gap: 8 (ref 3–11)
BILIRUBIN TOTAL: 0.5 mg/dL (ref 0.2–1.2)
BUN: 5 mg/dL — AB (ref 7–26)
CALCIUM: 9.4 mg/dL (ref 8.4–10.4)
CO2: 28 mmol/L (ref 22–29)
CREATININE: 0.64 mg/dL (ref 0.60–1.10)
Chloride: 104 mmol/L (ref 98–109)
GFR calc Af Amer: >60 mL/min (ref >60)
GFR calc non Af Amer: >60 mL/min (ref >60)
GLUCOSE: 94 mg/dL (ref 70–140)
Potassium: 4 mmol/L (ref 3.5–5.1)
SODIUM: 140 mmol/L (ref 136–145)
Total Protein: 6.3 g/dL — ABNORMAL LOW (ref 6.4–8.3)

## 2017-12-11 LAB — CBC WITH DIFFERENTIAL/PLATELET
BASOS PCT: 1 %
Basophils Absolute: 0 10*3/uL (ref 0.0–0.1)
EOS ABS: 0 10*3/uL (ref 0.0–0.5)
EOS PCT: 1 %
HEMATOCRIT: 24.7 % — AB (ref 34.8–46.6)
Hemoglobin: 8.4 g/dL — ABNORMAL LOW (ref 11.6–15.9)
Lymphocytes Relative: 12 %
Lymphs Abs: 0.3 10*3/uL — ABNORMAL LOW (ref 0.9–3.3)
MCH: 31.4 pg (ref 25.1–34.0)
MCHC: 33.9 g/dL (ref 31.5–36.0)
MCV: 92.6 fL (ref 79.5–101.0)
MONO ABS: 0.2 10*3/uL (ref 0.1–0.9)
MONOS PCT: 9 %
Neutro Abs: 2.2 10*3/uL (ref 1.5–6.5)
Neutrophils Relative %: 77 %
PLATELETS: 193 10*3/uL (ref 145–400)
RBC: 2.67 MIL/uL — ABNORMAL LOW (ref 3.70–5.45)
RDW: 20.3 % — AB (ref 11.2–14.5)
WBC: 2.8 10*3/uL — ABNORMAL LOW (ref 3.9–10.3)

## 2017-12-11 MED ORDER — SODIUM CHLORIDE 0.9% FLUSH
10.0000 mL | INTRAVENOUS | Status: DC | PRN
  Administered 2017-12-11: 10 mL
  Filled 2017-12-11: qty 10

## 2017-12-11 MED ORDER — SODIUM CHLORIDE 0.9 % IV SOLN
20.0000 mg | Freq: Once | INTRAVENOUS | Status: AC
  Administered 2017-12-11: 20 mg via INTRAVENOUS
  Filled 2017-12-11: qty 2

## 2017-12-11 MED ORDER — SODIUM CHLORIDE 0.9 % IV SOLN
80.0000 mg/m2 | Freq: Once | INTRAVENOUS | Status: AC
  Administered 2017-12-11: 138 mg via INTRAVENOUS
  Filled 2017-12-11: qty 23

## 2017-12-11 MED ORDER — DIPHENHYDRAMINE HCL 50 MG/ML IJ SOLN
INTRAMUSCULAR | Status: AC
  Filled 2017-12-11: qty 1

## 2017-12-11 MED ORDER — DIPHENHYDRAMINE HCL 50 MG/ML IJ SOLN
50.0000 mg | Freq: Once | INTRAMUSCULAR | Status: AC
  Administered 2017-12-11: 50 mg via INTRAVENOUS

## 2017-12-11 MED ORDER — HEPARIN SOD (PORK) LOCK FLUSH 100 UNIT/ML IV SOLN
500.0000 [IU] | Freq: Once | INTRAVENOUS | Status: AC | PRN
  Administered 2017-12-11: 500 [IU]
  Filled 2017-12-11: qty 5

## 2017-12-11 MED ORDER — SODIUM CHLORIDE 0.9% FLUSH
10.0000 mL | Freq: Once | INTRAVENOUS | Status: AC
  Administered 2017-12-11: 10 mL
  Filled 2017-12-11: qty 10

## 2017-12-11 MED ORDER — SODIUM CHLORIDE 0.9 % IV SOLN
Freq: Once | INTRAVENOUS | Status: AC
  Administered 2017-12-11: 12:00:00 via INTRAVENOUS

## 2017-12-11 MED ORDER — FAMOTIDINE IN NACL 20-0.9 MG/50ML-% IV SOLN: 20.0000 mg | Freq: Once | INTRAVENOUS | Status: DC

## 2017-12-11 NOTE — Patient Instructions (Signed)
[facility] Discharge Instructions for Patients Receiving Chemotherapy  Today you received the following chemotherapy agents:  Taxol.  To help prevent nausea and vomiting after your treatment, we encourage you to take your nausea medication as directed.   If you develop nausea and vomiting that is not controlled by your nausea medication, call the clinic.   BELOW ARE SYMPTOMS THAT SHOULD BE REPORTED IMMEDIATELY:  *FEVER GREATER THAN 100.5 F  *CHILLS WITH OR WITHOUT FEVER  NAUSEA AND VOMITING THAT IS NOT CONTROLLED WITH YOUR NAUSEA MEDICATION  *UNUSUAL SHORTNESS OF BREATH  *UNUSUAL BRUISING OR BLEEDING  TENDERNESS IN MOUTH AND THROAT WITH OR WITHOUT PRESENCE OF ULCERS  *URINARY PROBLEMS  *BOWEL PROBLEMS  UNUSUAL RASH Items with * indicate a potential emergency and should be followed up as soon as possible.  Feel free to call the clinic should you have any questions or concerns. The clinic phone number is [phone].  Please show the CHEMO ALERT CARD at check-in to the Emergency Department and triage nurse.   

## 2017-12-12 ENCOUNTER — Telehealth: Payer: Self-pay | Admitting: Hematology and Oncology

## 2017-12-12 NOTE — Telephone Encounter (Signed)
Spoke to patient regarding upcoming March and April appointments.

## 2017-12-13 ENCOUNTER — Telehealth: Payer: Self-pay | Admitting: Hematology and Oncology

## 2017-12-13 NOTE — Telephone Encounter (Signed)
Patient called to reschedule because of work hours

## 2017-12-18 ENCOUNTER — Telehealth: Payer: Self-pay | Admitting: Adult Health

## 2017-12-18 ENCOUNTER — Inpatient Hospital Stay (HOSPITAL_BASED_OUTPATIENT_CLINIC_OR_DEPARTMENT_OTHER): Payer: BLUE CROSS/BLUE SHIELD | Admitting: Adult Health

## 2017-12-18 ENCOUNTER — Encounter: Payer: Self-pay | Admitting: *Deleted

## 2017-12-18 ENCOUNTER — Inpatient Hospital Stay: Payer: BLUE CROSS/BLUE SHIELD

## 2017-12-18 ENCOUNTER — Encounter: Payer: Self-pay | Admitting: Adult Health

## 2017-12-18 VITALS — BP 109/72 | HR 93 | Temp 98.0°F | Resp 20 | Ht 66.0 in | Wt 127.6 lb

## 2017-12-18 DIAGNOSIS — C50412 Malignant neoplasm of upper-outer quadrant of left female breast: Secondary | ICD-10-CM

## 2017-12-18 DIAGNOSIS — Z9049 Acquired absence of other specified parts of digestive tract: Secondary | ICD-10-CM

## 2017-12-18 DIAGNOSIS — Z923 Personal history of irradiation: Secondary | ICD-10-CM | POA: Diagnosis not present

## 2017-12-18 DIAGNOSIS — Z170 Estrogen receptor positive status [ER+]: Secondary | ICD-10-CM

## 2017-12-18 DIAGNOSIS — Z5111 Encounter for antineoplastic chemotherapy: Secondary | ICD-10-CM

## 2017-12-18 DIAGNOSIS — F419 Anxiety disorder, unspecified: Secondary | ICD-10-CM | POA: Diagnosis not present

## 2017-12-18 DIAGNOSIS — C778 Secondary and unspecified malignant neoplasm of lymph nodes of multiple regions: Secondary | ICD-10-CM | POA: Diagnosis not present

## 2017-12-18 DIAGNOSIS — D649 Anemia, unspecified: Secondary | ICD-10-CM

## 2017-12-18 DIAGNOSIS — Z95828 Presence of other vascular implants and grafts: Secondary | ICD-10-CM

## 2017-12-18 LAB — CBC WITH DIFFERENTIAL/PLATELET
BASOS ABS: 0 10*3/uL (ref 0.0–0.1)
BASOS PCT: 1 %
EOS ABS: 0.1 10*3/uL (ref 0.0–0.5)
Eosinophils Relative: 3 %
HCT: 27.4 % — ABNORMAL LOW (ref 34.8–46.6)
HEMOGLOBIN: 8.9 g/dL — AB (ref 11.6–15.9)
Lymphocytes Relative: 19 %
Lymphs Abs: 0.6 10*3/uL — ABNORMAL LOW (ref 0.9–3.3)
MCH: 31.4 pg (ref 25.1–34.0)
MCHC: 32.5 g/dL (ref 31.5–36.0)
MCV: 96.8 fL (ref 79.5–101.0)
Monocytes Absolute: 0.3 10*3/uL (ref 0.1–0.9)
Monocytes Relative: 9 %
NEUTROS PCT: 68 %
Neutro Abs: 2.1 10*3/uL (ref 1.5–6.5)
Platelets: 184 10*3/uL (ref 145–400)
RBC: 2.83 MIL/uL — ABNORMAL LOW (ref 3.70–5.45)
RDW: 18.7 % — ABNORMAL HIGH (ref 11.2–14.5)
WBC: 3 10*3/uL — AB (ref 3.9–10.3)

## 2017-12-18 LAB — COMPREHENSIVE METABOLIC PANEL
ALK PHOS: 58 U/L (ref 40–150)
ALT: 33 U/L (ref 0–55)
ANION GAP: 8 (ref 3–11)
AST: 23 U/L (ref 5–34)
Albumin: 3.8 g/dL (ref 3.5–5.0)
BUN: 7 mg/dL (ref 7–26)
CALCIUM: 9.3 mg/dL (ref 8.4–10.4)
CO2: 27 mmol/L (ref 22–29)
Chloride: 105 mmol/L (ref 98–109)
Creatinine, Ser: 0.61 mg/dL (ref 0.60–1.10)
Glucose, Bld: 86 mg/dL (ref 70–140)
Potassium: 4 mmol/L (ref 3.5–5.1)
Sodium: 140 mmol/L (ref 136–145)
Total Bilirubin: 0.5 mg/dL (ref 0.2–1.2)
Total Protein: 6.4 g/dL (ref 6.4–8.3)

## 2017-12-18 MED ORDER — HEPARIN SOD (PORK) LOCK FLUSH 100 UNIT/ML IV SOLN
500.0000 [IU] | Freq: Once | INTRAVENOUS | Status: AC | PRN
  Administered 2017-12-18: 500 [IU]
  Filled 2017-12-18: qty 5

## 2017-12-18 MED ORDER — SODIUM CHLORIDE 0.9 % IV SOLN
Freq: Once | INTRAVENOUS | Status: AC
  Administered 2017-12-18: 12:00:00 via INTRAVENOUS

## 2017-12-18 MED ORDER — SODIUM CHLORIDE 0.9 % IV SOLN
20.0000 mg | Freq: Once | INTRAVENOUS | Status: AC
  Administered 2017-12-18: 20 mg via INTRAVENOUS
  Filled 2017-12-18: qty 2

## 2017-12-18 MED ORDER — FAMOTIDINE IN NACL 20-0.9 MG/50ML-% IV SOLN: 20.0000 mg | Freq: Once | INTRAVENOUS | Status: DC

## 2017-12-18 MED ORDER — DIPHENHYDRAMINE HCL 50 MG/ML IJ SOLN
INTRAMUSCULAR | Status: AC
  Filled 2017-12-18: qty 1

## 2017-12-18 MED ORDER — SODIUM CHLORIDE 0.9% FLUSH
10.0000 mL | INTRAVENOUS | Status: DC | PRN
  Administered 2017-12-18: 10 mL
  Filled 2017-12-18: qty 10

## 2017-12-18 MED ORDER — SODIUM CHLORIDE 0.9 % IV SOLN
80.0000 mg/m2 | Freq: Once | INTRAVENOUS | Status: AC
  Administered 2017-12-18: 138 mg via INTRAVENOUS
  Filled 2017-12-18: qty 23

## 2017-12-18 MED ORDER — SODIUM CHLORIDE 0.9% FLUSH
10.0000 mL | Freq: Once | INTRAVENOUS | Status: AC
  Administered 2017-12-18: 10 mL
  Filled 2017-12-18: qty 10

## 2017-12-18 MED ORDER — DIPHENHYDRAMINE HCL 50 MG/ML IJ SOLN
50.0000 mg | Freq: Once | INTRAMUSCULAR | Status: AC
  Administered 2017-12-18: 50 mg via INTRAVENOUS

## 2017-12-18 NOTE — Patient Instructions (Signed)
[facility] Discharge Instructions for Patients Receiving Chemotherapy  Today you received the following chemotherapy agents:  Taxol.  To help prevent nausea and vomiting after your treatment, we encourage you to take your nausea medication as directed.   If you develop nausea and vomiting that is not controlled by your nausea medication, call the clinic.   BELOW ARE SYMPTOMS THAT SHOULD BE REPORTED IMMEDIATELY:  *FEVER GREATER THAN 100.5 F  *CHILLS WITH OR WITHOUT FEVER  NAUSEA AND VOMITING THAT IS NOT CONTROLLED WITH YOUR NAUSEA MEDICATION  *UNUSUAL SHORTNESS OF BREATH  *UNUSUAL BRUISING OR BLEEDING  TENDERNESS IN MOUTH AND THROAT WITH OR WITHOUT PRESENCE OF ULCERS  *URINARY PROBLEMS  *BOWEL PROBLEMS  UNUSUAL RASH Items with * indicate a potential emergency and should be followed up as soon as possible.  Feel free to call the clinic should you have any questions or concerns. The clinic phone number is [phone].  Please show the CHEMO ALERT CARD at check-in to the Emergency Department and triage nurse.   

## 2017-12-18 NOTE — Telephone Encounter (Signed)
No 2/27 los.  

## 2017-12-18 NOTE — Progress Notes (Signed)
[facility] Follow up:    [name], MD [place] [address] [address]   DIAGNOSIS: Cancer Staging Malignant neoplasm of upper-outer quadrant of left breast in female, estrogen receptor positive ([place]) Staging form: Breast, AJCC 8th Edition - Clinical stage from 09/04/2017: Stage IIIA (cT2, cN1, cM0, G3, ER: Positive, PR: Negative, HER2: Negative) - Unsigned Staging comments: Staged at breast conference on 11.14.18   SUMMARY OF ONCOLOGIC HISTORY:   Malignant neoplasm of upper-outer quadrant of left breast in female, estrogen receptor positive ([place])   08/29/2017 Initial Diagnosis    Screening detected left breast mass 4 cm at 12 o'clock position, 1.4 cm lymph node the axilla, biopsy of the mass and the lymph nodes were positive for IDC with DCIS grade 3 ER 70% PR 0%, KI 2%, HER-2 negative ratio 1.77, T2N1 stage IIIa clinical stage AJCC 8      09/11/2017 Breast MRI    Left breast malignancy with associated linear NME extending posteriorly as well as satellite nodules measures up to 5.3 cm, at least 4 abnormal left axillary lymph nodes      09/11/2017 Imaging    CT CAP and Bone scan negative for metastatic disease      09/18/2017 Miscellaneous    Mammaprint high risk luminal type B.  Average 10-year risk of recurrence untreated 29%; with chemotherapy and hormonal therapy distant metastasis free interval at 5 years 94.6%      10/02/2017 -  Neo-Adjuvant Chemotherapy    Neoadjuvant chemotherapy with dose dense Adriamycin and Cytoxan x4 followed by Taxol weekly x12       CURRENT THERAPY: Taxol week 4  INTERVAL HISTORY: Tonya Jackson 52 y.o. female returns for evaluation prior to receiving her fourth week of Taxol.  So far she is doing well.  She does note some mild fatigue.  She also notes that she experiences occasional pain on her fingernails.  Otherwise, she is well and without concerns.     Patient Active Problem List   Diagnosis Date Noted  . Port-A-Cath in place 10/16/2017  . Malignant neoplasm of upper-outer quadrant of left breast in female, estrogen receptor positive ([place]) 09/03/2017    is allergic to other.  MEDICAL HISTORY: Past Medical History:  Diagnosis Date  . Abnormal Pap smear 1998  . Anemia 2003  . Cancer ([place]) 08/2017   Left breast cancer  . Dysuria 2011  . H/O cyst of breast 2010   Right  . H/O diarrhea 2009  . H/O dysmenorrhea 2003  . H/O hematuria 2011  . H/O varicella   . H/O vulvovaginitis 2010  . H/O: menorrhagia 2003  . History of measles, mumps, or rubella   . Hx: UTI (urinary tract infection) 2008  . Menses, irregular 2003  . Monilial vaginitis 2003  . Oligomenorrhea 11/05/2011  . Perimenopausal symptoms 11/05/2011  . Vulvar inflammation 2003  . Yeast infection     SURGICAL HISTORY: Past Surgical History:  Procedure Laterality Date  . APPENDECTOMY  1982   An open   . PORTACATH PLACEMENT Right 09/27/2017   Procedure: INSERTION PORT-A-CATH;  Surgeon: Jovita Kussmaul, MD;  Location: [facility];  Service: General;  Laterality: Right;  . WISDOM TOOTH EXTRACTION      SOCIAL HISTORY: Social History   Socioeconomic History  . Marital status: Married    Spouse name: Not on file  . Number of children: Not on file  . Years of education: Not on file  .  Highest education level: Not on file  Social Needs  . Financial resource strain: Not on file  . Food insecurity - worry: Not on file  . Food insecurity - inability: Not on file  . Transportation needs - medical: Not on file  . Transportation needs - non-medical: Not on file  Occupational History  . Not on file  Tobacco Use  . Smoking status: Never Smoker  . Smokeless tobacco: Never Used  Substance and Sexual Activity  . Alcohol use: No  . Drug use: No  . Sexual activity: Yes    Birth control/protection: Post-menopausal  Other Topics Concern  . Not on file  Social History Narrative  . Not on  file    FAMILY HISTORY: Family History  Problem Relation Age of Onset  . Diabetes Father   . Hypertension Father   . Hypertension Other     Review of Systems  Constitutional: Positive for fatigue. Negative for appetite change, chills, fever and unexpected weight change.  HENT:   Negative for hearing loss, lump/mass, sore throat and trouble swallowing.   Eyes: Negative for eye problems and icterus.  Respiratory: Negative for chest tightness, cough and shortness of breath.   Cardiovascular: Negative for chest pain, leg swelling and palpitations.  Gastrointestinal: Negative for abdominal distention, abdominal pain, constipation, diarrhea, nausea and vomiting.  Endocrine: Negative for hot flashes.  Genitourinary: Negative for difficulty urinating.   Musculoskeletal: Negative for arthralgias.  Skin: Negative for itching and rash.  Neurological: Negative for dizziness, extremity weakness, headaches and numbness.  Hematological: Negative for adenopathy. Does not bruise/bleed easily.  Psychiatric/Behavioral: Negative for depression. The patient is not nervous/anxious.       PHYSICAL EXAMINATION  ECOG PERFORMANCE STATUS: 1 - Symptomatic but completely ambulatory  Vitals:   12/18/17 1028  BP: 109/72  Pulse: 93  Resp: 20  Temp: 98 F (36.7 C)  SpO2: 100%    Physical Exam  Constitutional: She is oriented to person, place, and time and well-developed, well-nourished, and in no distress.  HENT:  Head: Normocephalic and atraumatic.  Mouth/Throat: Oropharynx is clear and moist. No oropharyngeal exudate.  Eyes: Pupils are equal, round, and reactive to light. No scleral icterus.  Neck: Neck supple.  Cardiovascular: Normal rate, regular rhythm and normal heart sounds.  Pulmonary/Chest: Effort normal and breath sounds normal.  Abdominal: Soft. Bowel sounds are normal. She exhibits no distension. There is no tenderness.  Musculoskeletal: She exhibits no edema.  Lymphadenopathy:     She has no cervical adenopathy.  Neurological: She is alert and oriented to person, place, and time.  Skin: Skin is warm and dry. No rash noted.  Psychiatric: Mood and affect normal.    LABORATORY DATA:  CBC    Component Value Date/Time   WBC 3.0 (L) 12/18/2017 0937   RBC 2.83 (L) 12/18/2017 0937   HGB 8.9 (L) 12/18/2017 0937   HGB 11.5 (L) 10/16/2017 0911   HCT 27.4 (L) 12/18/2017 0937   HCT 35.0 10/16/2017 0911   PLT 184 12/18/2017 0937   PLT 269 10/16/2017 0911   MCV 96.8 12/18/2017 0937   MCV 89.3 10/16/2017 0911   MCH 31.4 12/18/2017 0937   MCHC 32.5 12/18/2017 0937   RDW 18.7 (H) 12/18/2017 0937   RDW 13.3 10/16/2017 0911   LYMPHSABS 0.6 (L) 12/18/2017 0937   LYMPHSABS 1.2 10/16/2017 0911   MONOABS 0.3 12/18/2017 0937   MONOABS 0.6 10/16/2017 0911   EOSABS 0.1 12/18/2017 0937   EOSABS 0.0 10/16/2017 0911  BASOSABS 0.0 12/18/2017 0937   BASOSABS 0.0 10/16/2017 0911    CMP     Component Value Date/Time   NA 140 12/18/2017 0937   NA 140 10/16/2017 0911   K 4.0 12/18/2017 0937   K 3.9 10/16/2017 0911   CL 105 12/18/2017 0937   CO2 27 12/18/2017 0937   CO2 26 10/16/2017 0911   GLUCOSE 86 12/18/2017 0937   GLUCOSE 90 10/16/2017 0911   BUN 7 12/18/2017 0937   BUN 7.3 10/16/2017 0911   CREATININE 0.61 12/18/2017 0937   CREATININE 0.7 10/16/2017 0911   CALCIUM 9.3 12/18/2017 0937   CALCIUM 9.0 10/16/2017 0911   PROT 6.4 12/18/2017 0937   PROT 6.8 10/16/2017 0911   ALBUMIN 3.8 12/18/2017 0937   ALBUMIN 3.9 10/16/2017 0911   AST 23 12/18/2017 0937   AST 14 10/16/2017 0911   ALT 33 12/18/2017 0937   ALT 11 10/16/2017 0911   ALKPHOS 58 12/18/2017 0937   ALKPHOS 60 10/16/2017 0911   BILITOT 0.5 12/18/2017 0937   BILITOT 0.29 10/16/2017 0911   GFRNONAA >60 12/18/2017 0937   GFRAA >60 12/18/2017 8882       PENDING LABS:   RADIOGRAPHIC STUDIES:  No results found.   PATHOLOGY:     ASSESSMENT and THERAPY PLAN:   Malignant neoplasm of  upper-outer quadrant of left breast in female, estrogen receptor positive ([place]) 08/29/2017: Screening detected left breast mass 4 cm at 12 o'clock position, 1.4 cm lymph node the axilla, biopsy of the mass and the lymph nodes were positive for IDC with DCIS grade 3 ER 70% PR 0%, KI 2%, HER-2 negative ratio 1.77, T2N1 stage IIIa clinical stage AJCC 8  Mammaprinthigh risk luminal type B. Average 10-year risk of recurrence untreated 29%; with chemotherapy and hormonal therapy distant metastasis free interval at 5 years 94.6%  09/11/2017 breast [id number] breast malignancy with associated linear NME extending posteriorly as well as satellite nodules measures up to 5.3 cm, at least 4 abnormal left axillary lymph nodes  Recommendation: CT scans negative for metastatic disease 1.Neoadjuvant chemotherapy with dose dense Adriamycin and Cytoxan followed by Taxol x12 2. Followed by breast conserving surgery and targeted lymph node dissection 3. Followed by radiation 4. Follow-up antiestrogen therapy -------------------------------------------------------------------- Current treatment: Completed 4 cycles of dose dense Adriamycin and Cytoxan, today is cycle4 Taxol neoadjuvantly Echocardiogram 09/24/2017: EF 55-60%  She is tolerating treatment well.  Will monitor for toxicities. I reviewed her CBC with her today which is normal.    She will return weekly for Taxol, and every other week for visit with myself or Dr. Lindi Adie.       All questions were answered. The patient knows to call the clinic with any problems, questions or concerns. We can certainly see the patient much sooner if necessary.  A total of (30) minutes of face-to-face time was spent with this patient with greater than 50% of that time in counseling and care-coordination.  This note was electronically signed. Scot Dock, NP 12/18/2017

## 2017-12-18 NOTE — Assessment & Plan Note (Addendum)
08/29/2017: Screening detected left breast mass 4 cm at 12 o'clock position, 1.4 cm lymph node the axilla, biopsy of the mass and the lymph nodes were positive for IDC with DCIS grade 3 ER 70% PR 0%, KI 2%, HER-2 negative ratio 1.77, T2N1 stage IIIa clinical stage AJCC 8  Mammaprinthigh risk luminal type B. Average 10-year risk of recurrence untreated 29%; with chemotherapy and hormonal therapy distant metastasis free interval at 5 years 94.6%  09/11/2017 breast BOF:[id number] breast malignancy with associated linear NME extending posteriorly as well as satellite nodules measures up to 5.3 cm, at least 4 abnormal left axillary lymph nodes  Recommendation: CT scans negative for metastatic disease 1.Neoadjuvant chemotherapy with dose dense Adriamycin and Cytoxan followed by Taxol x12 2. Followed by breast conserving surgery and targeted lymph node dissection 3. Followed by radiation 4. Follow-up antiestrogen therapy -------------------------------------------------------------------- Current treatment: Completed 4 cycles of dose dense Adriamycin and Cytoxan, today is cycle4 Taxol neoadjuvantly Echocardiogram 09/24/2017: EF 55-60%  She is tolerating treatment well.  Will monitor for toxicities. I reviewed her CBC with her today which is normal.    She will return weekly for Taxol, and every other week for visit with myself or Dr. Lindi Adie.

## 2017-12-25 ENCOUNTER — Other Ambulatory Visit: Payer: BLUE CROSS/BLUE SHIELD

## 2017-12-25 ENCOUNTER — Inpatient Hospital Stay: Payer: BLUE CROSS/BLUE SHIELD

## 2017-12-25 ENCOUNTER — Inpatient Hospital Stay: Payer: BLUE CROSS/BLUE SHIELD | Attending: Hematology and Oncology

## 2017-12-25 DIAGNOSIS — R232 Flushing: Secondary | ICD-10-CM | POA: Diagnosis not present

## 2017-12-25 DIAGNOSIS — Z5111 Encounter for antineoplastic chemotherapy: Secondary | ICD-10-CM | POA: Diagnosis not present

## 2017-12-25 DIAGNOSIS — R490 Dysphonia: Secondary | ICD-10-CM | POA: Insufficient documentation

## 2017-12-25 DIAGNOSIS — C50412 Malignant neoplasm of upper-outer quadrant of left female breast: Secondary | ICD-10-CM | POA: Insufficient documentation

## 2017-12-25 DIAGNOSIS — Z170 Estrogen receptor positive status [ER+]: Principal | ICD-10-CM

## 2017-12-25 DIAGNOSIS — R5383 Other fatigue: Secondary | ICD-10-CM | POA: Insufficient documentation

## 2017-12-25 DIAGNOSIS — G629 Polyneuropathy, unspecified: Secondary | ICD-10-CM | POA: Insufficient documentation

## 2017-12-25 DIAGNOSIS — Z79899 Other long term (current) drug therapy: Secondary | ICD-10-CM | POA: Diagnosis not present

## 2017-12-25 LAB — CBC WITH DIFFERENTIAL/PLATELET
BASOS ABS: 0 10*3/uL (ref 0.0–0.1)
BASOS PCT: 1 %
EOS ABS: 0.1 10*3/uL (ref 0.0–0.5)
EOS PCT: 3 %
HCT: 26.8 % — ABNORMAL LOW (ref 34.8–46.6)
Hemoglobin: 8.8 g/dL — ABNORMAL LOW (ref 11.6–15.9)
Lymphocytes Relative: 15 %
Lymphs Abs: 0.4 10*3/uL — ABNORMAL LOW (ref 0.9–3.3)
MCH: 31.5 pg (ref 25.1–34.0)
MCHC: 32.9 g/dL (ref 31.5–36.0)
MCV: 95.6 fL (ref 79.5–101.0)
MONO ABS: 0.3 10*3/uL (ref 0.1–0.9)
Monocytes Relative: 10 %
Neutro Abs: 1.8 10*3/uL (ref 1.5–6.5)
Neutrophils Relative %: 71 %
PLATELETS: 193 10*3/uL (ref 145–400)
RBC: 2.81 MIL/uL — ABNORMAL LOW (ref 3.70–5.45)
RDW: 19.3 % — AB (ref 11.2–14.5)
WBC: 2.6 10*3/uL — ABNORMAL LOW (ref 3.9–10.3)

## 2017-12-25 LAB — COMPREHENSIVE METABOLIC PANEL
ALBUMIN: 4 g/dL (ref 3.5–5.0)
ALK PHOS: 61 U/L (ref 40–150)
ALT: 48 U/L (ref 0–55)
AST: 33 U/L (ref 5–34)
Anion gap: 8 (ref 3–11)
BILIRUBIN TOTAL: 0.6 mg/dL (ref 0.2–1.2)
BUN: 7 mg/dL (ref 7–26)
CALCIUM: 9.3 mg/dL (ref 8.4–10.4)
CO2: 27 mmol/L (ref 22–29)
CREATININE: 0.63 mg/dL (ref 0.60–1.10)
Chloride: 103 mmol/L (ref 98–109)
GFR calc Af Amer: >60 mL/min (ref >60)
GFR calc non Af Amer: >60 mL/min (ref >60)
GLUCOSE: 87 mg/dL (ref 70–140)
Potassium: 3.6 mmol/L (ref 3.5–5.1)
Sodium: 138 mmol/L (ref 136–145)
TOTAL PROTEIN: 6.7 g/dL (ref 6.4–8.3)

## 2017-12-25 MED ORDER — DIPHENHYDRAMINE HCL 50 MG/ML IJ SOLN
INTRAMUSCULAR | Status: AC
  Filled 2017-12-25: qty 1

## 2017-12-25 MED ORDER — SODIUM CHLORIDE 0.9 % IV SOLN
Freq: Once | INTRAVENOUS | Status: AC
Start: 2017-12-25 — End: 2017-12-25
  Administered 2017-12-25: 14:00:00 via INTRAVENOUS

## 2017-12-25 MED ORDER — SODIUM CHLORIDE 0.9 % IV SOLN
20.0000 mg | Freq: Once | INTRAVENOUS | Status: AC
  Administered 2017-12-25: 20 mg via INTRAVENOUS
  Filled 2017-12-25: qty 2

## 2017-12-25 MED ORDER — SODIUM CHLORIDE 0.9 % IV SOLN
80.0000 mg/m2 | Freq: Once | INTRAVENOUS | Status: AC
  Administered 2017-12-25: 138 mg via INTRAVENOUS
  Filled 2017-12-25: qty 23

## 2017-12-25 MED ORDER — SODIUM CHLORIDE 0.9% FLUSH
10.0000 mL | INTRAVENOUS | Status: DC | PRN
  Administered 2017-12-25: 10 mL
  Filled 2017-12-25: qty 10

## 2017-12-25 MED ORDER — DIPHENHYDRAMINE HCL 50 MG/ML IJ SOLN
50.0000 mg | Freq: Once | INTRAMUSCULAR | Status: AC
  Administered 2017-12-25: 50 mg via INTRAVENOUS

## 2017-12-25 MED ORDER — HEPARIN SOD (PORK) LOCK FLUSH 100 UNIT/ML IV SOLN
500.0000 [IU] | Freq: Once | INTRAVENOUS | Status: AC | PRN
  Administered 2017-12-25: 500 [IU]
  Filled 2017-12-25: qty 5

## 2017-12-25 MED ORDER — FAMOTIDINE IN NACL 20-0.9 MG/50ML-% IV SOLN: 20.0000 mg | Freq: Once | INTRAVENOUS | Status: DC

## 2017-12-25 NOTE — Patient Instructions (Signed)
[facility] Discharge Instructions for Patients Receiving Chemotherapy  Today you received the following chemotherapy agents:  Taxol.  To help prevent nausea and vomiting after your treatment, we encourage you to take your nausea medication as directed.   If you develop nausea and vomiting that is not controlled by your nausea medication, call the clinic.   BELOW ARE SYMPTOMS THAT SHOULD BE REPORTED IMMEDIATELY:  *FEVER GREATER THAN 100.5 F  *CHILLS WITH OR WITHOUT FEVER  NAUSEA AND VOMITING THAT IS NOT CONTROLLED WITH YOUR NAUSEA MEDICATION  *UNUSUAL SHORTNESS OF BREATH  *UNUSUAL BRUISING OR BLEEDING  TENDERNESS IN MOUTH AND THROAT WITH OR WITHOUT PRESENCE OF ULCERS  *URINARY PROBLEMS  *BOWEL PROBLEMS  UNUSUAL RASH Items with * indicate a potential emergency and should be followed up as soon as possible.  Feel free to call the clinic should you have any questions or concerns. The clinic phone number is [phone].  Please show the CHEMO ALERT CARD at check-in to the Emergency Department and triage nurse.   

## 2017-12-31 ENCOUNTER — Telehealth: Payer: Self-pay | Admitting: Hematology and Oncology

## 2017-12-31 NOTE — Telephone Encounter (Signed)
10:33 am spoke with patient to confirm FMLA has been successfully faxed to [facility] @ [phone] @ 10:23 am.  Patient requested a copy be left at front desk reception area to pick up for personal records.

## 2018-01-01 ENCOUNTER — Inpatient Hospital Stay: Payer: BLUE CROSS/BLUE SHIELD

## 2018-01-01 ENCOUNTER — Inpatient Hospital Stay (HOSPITAL_BASED_OUTPATIENT_CLINIC_OR_DEPARTMENT_OTHER): Payer: BLUE CROSS/BLUE SHIELD | Admitting: Hematology and Oncology

## 2018-01-01 DIAGNOSIS — Z79899 Other long term (current) drug therapy: Secondary | ICD-10-CM

## 2018-01-01 DIAGNOSIS — Z170 Estrogen receptor positive status [ER+]: Principal | ICD-10-CM

## 2018-01-01 DIAGNOSIS — C50412 Malignant neoplasm of upper-outer quadrant of left female breast: Secondary | ICD-10-CM

## 2018-01-01 DIAGNOSIS — Z95828 Presence of other vascular implants and grafts: Secondary | ICD-10-CM

## 2018-01-01 DIAGNOSIS — G629 Polyneuropathy, unspecified: Secondary | ICD-10-CM | POA: Diagnosis not present

## 2018-01-01 DIAGNOSIS — R490 Dysphonia: Secondary | ICD-10-CM | POA: Diagnosis not present

## 2018-01-01 DIAGNOSIS — R232 Flushing: Secondary | ICD-10-CM | POA: Diagnosis not present

## 2018-01-01 DIAGNOSIS — R5383 Other fatigue: Secondary | ICD-10-CM | POA: Diagnosis not present

## 2018-01-01 DIAGNOSIS — Z5111 Encounter for antineoplastic chemotherapy: Secondary | ICD-10-CM

## 2018-01-01 LAB — COMPREHENSIVE METABOLIC PANEL
ALBUMIN: 3.9 g/dL (ref 3.5–5.0)
ALT: 55 U/L (ref 0–55)
ANION GAP: 5 (ref 3–11)
AST: 34 U/L (ref 5–34)
Alkaline Phosphatase: 60 U/L (ref 40–150)
BILIRUBIN TOTAL: 0.5 mg/dL (ref 0.2–1.2)
BUN: 7 mg/dL (ref 7–26)
CALCIUM: 9.5 mg/dL (ref 8.4–10.4)
CO2: 29 mmol/L (ref 22–29)
Chloride: 104 mmol/L (ref 98–109)
Creatinine, Ser: 0.63 mg/dL (ref 0.60–1.10)
GFR calc Af Amer: >60 mL/min (ref >60)
GFR calc non Af Amer: >60 mL/min (ref >60)
GLUCOSE: 81 mg/dL (ref 70–140)
POTASSIUM: 4 mmol/L (ref 3.5–5.1)
Sodium: 138 mmol/L (ref 136–145)
TOTAL PROTEIN: 6.5 g/dL (ref 6.4–8.3)

## 2018-01-01 LAB — CBC WITH DIFFERENTIAL/PLATELET
BASOS ABS: 0 10*3/uL (ref 0.0–0.1)
BASOS PCT: 1 %
EOS PCT: 3 %
Eosinophils Absolute: 0.1 10*3/uL (ref 0.0–0.5)
HCT: 27.8 % — ABNORMAL LOW (ref 34.8–46.6)
Hemoglobin: 9.2 g/dL — ABNORMAL LOW (ref 11.6–15.9)
Lymphocytes Relative: 20 %
Lymphs Abs: 0.5 10*3/uL — ABNORMAL LOW (ref 0.9–3.3)
MCH: 32 pg (ref 25.1–34.0)
MCHC: 33.1 g/dL (ref 31.5–36.0)
MCV: 96.7 fL (ref 79.5–101.0)
MONO ABS: 0.2 10*3/uL (ref 0.1–0.9)
Monocytes Relative: 9 %
NEUTROS ABS: 1.8 10*3/uL (ref 1.5–6.5)
Neutrophils Relative %: 67 %
PLATELETS: 192 10*3/uL (ref 145–400)
RBC: 2.87 MIL/uL — AB (ref 3.70–5.45)
RDW: 18 % — AB (ref 11.2–14.5)
WBC: 2.6 10*3/uL — ABNORMAL LOW (ref 3.9–10.3)

## 2018-01-01 MED ORDER — SODIUM CHLORIDE 0.9 % IV SOLN
80.0000 mg/m2 | Freq: Once | INTRAVENOUS | Status: AC
  Administered 2018-01-01: 138 mg via INTRAVENOUS
  Filled 2018-01-01: qty 23

## 2018-01-01 MED ORDER — HEPARIN SOD (PORK) LOCK FLUSH 100 UNIT/ML IV SOLN
500.0000 [IU] | Freq: Once | INTRAVENOUS | Status: AC | PRN
  Administered 2018-01-01: 500 [IU]
  Filled 2018-01-01: qty 5

## 2018-01-01 MED ORDER — SODIUM CHLORIDE 0.9 % IV SOLN
20.0000 mg | Freq: Once | INTRAVENOUS | Status: AC
  Administered 2018-01-01: 20 mg via INTRAVENOUS
  Filled 2018-01-01: qty 2

## 2018-01-01 MED ORDER — SODIUM CHLORIDE 0.9 % IV SOLN
Freq: Once | INTRAVENOUS | Status: AC
  Administered 2018-01-01: 15:00:00 via INTRAVENOUS

## 2018-01-01 MED ORDER — SODIUM CHLORIDE 0.9% FLUSH
10.0000 mL | INTRAVENOUS | Status: DC | PRN
  Administered 2018-01-01: 10 mL
  Filled 2018-01-01: qty 10

## 2018-01-01 MED ORDER — FAMOTIDINE IN NACL 20-0.9 MG/50ML-% IV SOLN: 20.0000 mg | Freq: Once | INTRAVENOUS | Status: DC

## 2018-01-01 MED ORDER — SODIUM CHLORIDE 0.9% FLUSH
10.0000 mL | Freq: Once | INTRAVENOUS | Status: AC
  Administered 2018-01-01: 10 mL
  Filled 2018-01-01: qty 10

## 2018-01-01 MED ORDER — DIPHENHYDRAMINE HCL 50 MG/ML IJ SOLN
INTRAMUSCULAR | Status: AC
Start: 2018-01-01 — End: 2018-01-01
  Filled 2018-01-01: qty 1

## 2018-01-01 MED ORDER — DIPHENHYDRAMINE HCL 50 MG/ML IJ SOLN
50.0000 mg | Freq: Once | INTRAMUSCULAR | Status: AC
  Administered 2018-01-01: 50 mg via INTRAVENOUS

## 2018-01-01 NOTE — Progress Notes (Signed)
Patient Care Team: [name], MD as PCP - General (Family Medicine)  DIAGNOSIS:  Encounter Diagnosis  Name Primary?  . Malignant neoplasm of upper-outer quadrant of left breast in female, estrogen receptor positive ([place])     SUMMARY OF ONCOLOGIC HISTORY:   Malignant neoplasm of upper-outer quadrant of left breast in female, estrogen receptor positive ([place])   08/29/2017 Initial Diagnosis    Screening detected left breast mass 4 cm at 12 o'clock position, 1.4 cm lymph node the axilla, biopsy of the mass and the lymph nodes were positive for IDC with DCIS grade 3 ER 70% PR 0%, KI 2%, HER-2 negative ratio 1.77, T2N1 stage IIIa clinical stage AJCC 8      09/11/2017 Breast MRI    Left breast malignancy with associated linear NME extending posteriorly as well as satellite nodules measures up to 5.3 cm, at least 4 abnormal left axillary lymph nodes      09/11/2017 Imaging    CT CAP and Bone scan negative for metastatic disease      09/18/2017 Miscellaneous    Mammaprint high risk luminal type B.  Average 10-year risk of recurrence untreated 29%; with chemotherapy and hormonal therapy distant metastasis free interval at 5 years 94.6%      10/02/2017 -  Neo-Adjuvant Chemotherapy    Neoadjuvant chemotherapy with dose dense Adriamycin and Cytoxan x4 followed by Taxol weekly x12       CHIEF COMPLIANT: Cycle 6 of Taxol and  INTERVAL HISTORY: Tonya Jackson is a 52 year old with above-mentioned history of left breast cancer currently neoadjuvant chemotherapy and today's cycle 6 of Taxol.  She appears to be doing quite well.  She has no nausea vomiting.  Energy level is excellent.  She has poor taste for which she is drinking Ensure.  She denies any neuropathy.  Excellent energy levels.  REVIEW OF SYSTEMS:   Constitutional: Denies fevers, chills or abnormal weight loss Eyes: Denies blurriness of vision Ears, nose, mouth, throat, and face: Denies mucositis or sore  throat Respiratory: Denies cough, dyspnea or wheezes Cardiovascular: Denies palpitation, chest discomfort Gastrointestinal:  Denies nausea, heartburn or change in bowel habits Skin: Denies abnormal skin rashes Lymphatics: Denies new lymphadenopathy or easy bruising Neurological:Denies numbness, tingling or new weaknesses Behavioral/Psych: Mood is stable, no new changes  Extremities: No lower extremity edema Breast:  denies any pain or lumps or nodules in either breasts All other systems were reviewed with the patient and are negative.  I have reviewed the past medical history, past surgical history, social history and family history with the patient and they are unchanged from previous note.  ALLERGIES:  is allergic to other.  MEDICATIONS:  Current Outpatient Medications  Medication Sig Dispense Refill  . lidocaine-prilocaine (EMLA) cream Apply to affected area once 30 g 3  . LORazepam (ATIVAN) 0.5 MG tablet Take 1 tablet (0.5 mg total) by mouth at bedtime as needed (Nausea or vomiting). (Patient not taking: Reported on 12/18/2017) 30 tablet 0  . ondansetron (ZOFRAN) 8 MG tablet Take 1 tablet (8 mg total) by mouth 2 (two) times daily as needed. Start on the third day after chemotherapy. (Patient not taking: Reported on 12/18/2017) 30 tablet 1  . prochlorperazine (COMPAZINE) 10 MG tablet Take 1 tablet (10 mg total) by mouth every 6 (six) hours as needed (Nausea or vomiting). (Patient not taking: Reported on 12/18/2017) 30 tablet 1   No current facility-administered medications for this visit.    Facility-Administered Medications Ordered in Other Visits  Medication Dose Route Frequency Provider Last Rate Last Dose  . heparin lock flush 100 unit/mL  500 Units Intracatheter Once PRN Nicholas Lose, MD      . PACLitaxel (TAXOL) 138 mg in sodium chloride 0.9 % 250 mL chemo infusion (</= 1m/m2)  80 mg/m2 (Treatment Plan Recorded) Intravenous Once GNicholas Lose MD 273 mL/hr at 01/01/18 1533 138  mg at 01/01/18 1533  . sodium chloride flush (NS) 0.9 % injection 10 mL  10 mL Intracatheter PRN GNicholas Lose MD        PHYSICAL EXAMINATION: ECOG PERFORMANCE STATUS: 1 - Symptomatic but completely ambulatory  Vitals:   01/01/18 1324  BP: 116/66  Pulse: 97  Resp: 18  Temp: 98.2 F (36.8 C)  SpO2: 100%   Filed Weights   01/01/18 1324  Weight: 130 lb 3.2 oz (59.1 kg)    GENERAL:alert, no distress and comfortable SKIN: skin color, texture, turgor are normal, no rashes or significant lesions EYES: normal, Conjunctiva are pink and non-injected, sclera clear OROPHARYNX:no exudate, no erythema and lips, buccal mucosa, and tongue normal  NECK: supple, thyroid normal size, non-tender, without nodularity LYMPH:  no palpable lymphadenopathy in the cervical, axillary or inguinal LUNGS: clear to auscultation and percussion with normal breathing effort HEART: regular rate & rhythm and no murmurs and no lower extremity edema ABDOMEN:abdomen soft, non-tender and normal bowel sounds MUSCULOSKELETAL:no cyanosis of digits and no clubbing  NEURO: alert & oriented x 3 with fluent speech, no focal motor/sensory deficits EXTREMITIES: No lower extremity edema   LABORATORY DATA:  I have reviewed the data as listed CMP Latest Ref Rng & Units 01/01/2018 12/25/2017 12/18/2017  Glucose 70 - 140 mg/dL 81 87 86  BUN 7 - 26 mg/dL _0 Creatinine 0.60 - 1.10 mg/dL 0.63 0.63 0.61  Sodium 136 - 145 mmol/L 138 138 140  Potassium 3.5 - 5.1 mmol/L 4.0 3.6 4.0  Chloride 98 - 109 mmol/L 104 103 105  CO2 22 - 29 mmol/L _1 Calcium 8.4 - 10.4 mg/dL 9.5 9.3 9.3  Total Protein 6.4 - 8.3 g/dL 6.5 6.7 6.4  Total Bilirubin 0.2 - 1.2 mg/dL 0.5 0.6 0.5  Alkaline Phos 40 - 150 U/L 60 61 58  AST 5 - 34 U/L 34 33 23  ALT 0 - 55 U/L 55 48 33    Lab Results  Component Value Date   WBC 2.6 (L) 01/01/2018   HGB 9.2 (L) 01/01/2018   HCT 27.8 (L) 01/01/2018   MCV 96.7 01/01/2018   PLT 192 01/01/2018    NEUTROABS 1.8 01/01/2018    ASSESSMENT & PLAN:  Malignant neoplasm of upper-outer quadrant of left breast in female, estrogen receptor positive ([place] 08/29/2017: Screening detected left breast mass 4 cm at 12 o'clock position, 1.4 cm lymph node the axilla, biopsy of the mass and the lymph nodes were positive for IDC with DCIS grade 3 ER 70% PR 0%, KI 2%, HER-2 negative ratio 1.77, T2N1 stage IIIa clinical stage AJCC 8  Mammaprinthigh risk luminal type B. Average 10-year risk of recurrence untreated 29%; with chemotherapy and hormonal therapy distant metastasis free interval at 5 years 94.6%  09/11/2017 breast [id number] malignancy with associated linear NME extending posteriorly as well as satellite nodules measures up to 5.3 cm, at least 4 abnormal left axillary lymph nodes  Recommendation: CT scans negative for metastatic disease 1.Neoadjuvant chemotherapy with dose dense Adriamycin and Cytoxan followed by Taxol x12 2. Followed by breast conserving surgery and  targeted lymph node dissection 3. Followed by radiation 4. Follow-up antiestrogen therapy -------------------------------------------------------------------- Current treatment: Completed 4 cycles of dose dense Adriamycin and Cytoxan, today is cycle6 Taxol neoadjuvantly Echocardiogram 09/24/2017: EF 55-60%  She is tolerating treatment well.  Will monitor for toxicities. I reviewed her CBC with her today which is normal.   Return to clinic weekly for Taxol and every 2 weeks for follow-up with me  I spent 25 minutes talking to the patient of which more than half was spent in counseling and coordination of care.  No orders of the defined types were placed in this encounter.  The patient has a good understanding of the overall plan. she agrees with it. she will call with any problems that may develop before the next visit here.   Harriette Ohara, MD 01/01/18

## 2018-01-01 NOTE — Assessment & Plan Note (Signed)
08/29/2017: Screening detected left breast mass 4 cm at 12 o'clock position, 1.4 cm lymph node the axilla, biopsy of the mass and the lymph nodes were positive for IDC with DCIS grade 3 ER 70% PR 0%, KI 2%, HER-2 negative ratio 1.77, T2N1 stage IIIa clinical stage AJCC 8  Mammaprinthigh risk luminal type B. Average 10-year risk of recurrence untreated 29%; with chemotherapy and hormonal therapy distant metastasis free interval at 5 years 94.6%  09/11/2017 breast [id number] breast malignancy with associated linear NME extending posteriorly as well as satellite nodules measures up to 5.3 cm, at least 4 abnormal left axillary lymph nodes  Recommendation: CT scans negative for metastatic disease 1.Neoadjuvant chemotherapy with dose dense Adriamycin and Cytoxan followed by Taxol x12 2. Followed by breast conserving surgery and targeted lymph node dissection 3. Followed by radiation 4. Follow-up antiestrogen therapy -------------------------------------------------------------------- Current treatment: Completed 4 cycles of dose dense Adriamycin and Cytoxan, today is cycle6 Taxol neoadjuvantly Echocardiogram 09/24/2017: EF 55-60%  She is tolerating treatment well.  Will monitor for toxicities. I reviewed her CBC with her today which is normal.

## 2018-01-08 ENCOUNTER — Inpatient Hospital Stay: Payer: BLUE CROSS/BLUE SHIELD

## 2018-01-08 ENCOUNTER — Ambulatory Visit: Payer: BLUE CROSS/BLUE SHIELD

## 2018-01-08 ENCOUNTER — Other Ambulatory Visit: Payer: BLUE CROSS/BLUE SHIELD

## 2018-01-08 VITALS — BP 116/82 | HR 79 | Temp 97.7°F | Resp 17

## 2018-01-08 DIAGNOSIS — R232 Flushing: Secondary | ICD-10-CM | POA: Diagnosis not present

## 2018-01-08 DIAGNOSIS — Z170 Estrogen receptor positive status [ER+]: Secondary | ICD-10-CM

## 2018-01-08 DIAGNOSIS — C50412 Malignant neoplasm of upper-outer quadrant of left female breast: Secondary | ICD-10-CM | POA: Diagnosis not present

## 2018-01-08 DIAGNOSIS — Z5111 Encounter for antineoplastic chemotherapy: Secondary | ICD-10-CM | POA: Diagnosis not present

## 2018-01-08 DIAGNOSIS — G629 Polyneuropathy, unspecified: Secondary | ICD-10-CM | POA: Diagnosis not present

## 2018-01-08 DIAGNOSIS — R5383 Other fatigue: Secondary | ICD-10-CM | POA: Diagnosis not present

## 2018-01-08 DIAGNOSIS — Z95828 Presence of other vascular implants and grafts: Secondary | ICD-10-CM

## 2018-01-08 DIAGNOSIS — Z79899 Other long term (current) drug therapy: Secondary | ICD-10-CM | POA: Diagnosis not present

## 2018-01-08 DIAGNOSIS — R490 Dysphonia: Secondary | ICD-10-CM | POA: Diagnosis not present

## 2018-01-08 LAB — CBC WITH DIFFERENTIAL/PLATELET
BASOS PCT: 1 %
Basophils Absolute: 0 10*3/uL (ref 0.0–0.1)
Eosinophils Absolute: 0.1 10*3/uL (ref 0.0–0.5)
Eosinophils Relative: 2 %
HEMATOCRIT: 27.7 % — AB (ref 34.8–46.6)
HEMOGLOBIN: 9.4 g/dL — AB (ref 11.6–15.9)
Lymphocytes Relative: 19 %
Lymphs Abs: 0.5 10*3/uL — ABNORMAL LOW (ref 0.9–3.3)
MCH: 32.7 pg (ref 25.1–34.0)
MCHC: 33.9 g/dL (ref 31.5–36.0)
MCV: 96.4 fL (ref 79.5–101.0)
MONOS PCT: 8 %
Monocytes Absolute: 0.2 10*3/uL (ref 0.1–0.9)
NEUTROS PCT: 70 %
Neutro Abs: 2 10*3/uL (ref 1.5–6.5)
Platelets: 205 10*3/uL (ref 145–400)
RBC: 2.87 MIL/uL — AB (ref 3.70–5.45)
RDW: 17.4 % — ABNORMAL HIGH (ref 11.2–14.5)
WBC: 2.8 10*3/uL — AB (ref 3.9–10.3)

## 2018-01-08 LAB — COMPREHENSIVE METABOLIC PANEL
ALBUMIN: 3.9 g/dL (ref 3.5–5.0)
ALT: 66 U/L — AB (ref 0–55)
AST: 34 U/L (ref 5–34)
Alkaline Phosphatase: 65 U/L (ref 40–150)
Anion gap: 7 (ref 3–11)
BUN: 8 mg/dL (ref 7–26)
CHLORIDE: 103 mmol/L (ref 98–109)
CO2: 27 mmol/L (ref 22–29)
CREATININE: 0.61 mg/dL (ref 0.60–1.10)
Calcium: 9.3 mg/dL (ref 8.4–10.4)
GFR calc Af Amer: >60 mL/min (ref >60)
GFR calc non Af Amer: >60 mL/min (ref >60)
Glucose, Bld: 85 mg/dL (ref 70–140)
POTASSIUM: 3.9 mmol/L (ref 3.5–5.1)
SODIUM: 137 mmol/L (ref 136–145)
Total Bilirubin: 0.5 mg/dL (ref 0.2–1.2)
Total Protein: 6.4 g/dL (ref 6.4–8.3)

## 2018-01-08 MED ORDER — FAMOTIDINE IN NACL 20-0.9 MG/50ML-% IV SOLN
INTRAVENOUS | Status: AC
  Filled 2018-01-08: qty 50

## 2018-01-08 MED ORDER — SODIUM CHLORIDE 0.9% FLUSH
10.0000 mL | INTRAVENOUS | Status: DC | PRN
  Administered 2018-01-08: 10 mL
  Filled 2018-01-08: qty 10

## 2018-01-08 MED ORDER — SODIUM CHLORIDE 0.9 % IV SOLN
80.0000 mg/m2 | Freq: Once | INTRAVENOUS | Status: AC
  Administered 2018-01-08: 138 mg via INTRAVENOUS
  Filled 2018-01-08: qty 23

## 2018-01-08 MED ORDER — SODIUM CHLORIDE 0.9% FLUSH
10.0000 mL | Freq: Once | INTRAVENOUS | Status: AC
  Administered 2018-01-08: 10 mL
  Filled 2018-01-08: qty 10

## 2018-01-08 MED ORDER — DIPHENHYDRAMINE HCL 50 MG/ML IJ SOLN
INTRAMUSCULAR | Status: AC
  Filled 2018-01-08: qty 1

## 2018-01-08 MED ORDER — FAMOTIDINE IN NACL 20-0.9 MG/50ML-% IV SOLN
20.0000 mg | Freq: Once | INTRAVENOUS | Status: AC
  Administered 2018-01-08: 20 mg via INTRAVENOUS

## 2018-01-08 MED ORDER — SODIUM CHLORIDE 0.9 % IV SOLN
20.0000 mg | Freq: Once | INTRAVENOUS | Status: AC
  Administered 2018-01-08: 20 mg via INTRAVENOUS
  Filled 2018-01-08: qty 2

## 2018-01-08 MED ORDER — DIPHENHYDRAMINE HCL 50 MG/ML IJ SOLN
50.0000 mg | Freq: Once | INTRAMUSCULAR | Status: AC
  Administered 2018-01-08: 50 mg via INTRAVENOUS

## 2018-01-08 MED ORDER — SODIUM CHLORIDE 0.9 % IV SOLN
Freq: Once | INTRAVENOUS | Status: AC
  Administered 2018-01-08: 15:00:00 via INTRAVENOUS

## 2018-01-08 MED ORDER — HEPARIN SOD (PORK) LOCK FLUSH 100 UNIT/ML IV SOLN
500.0000 [IU] | Freq: Once | INTRAVENOUS | Status: AC | PRN
  Administered 2018-01-08: 500 [IU]
  Filled 2018-01-08: qty 5

## 2018-01-08 NOTE — Patient Instructions (Signed)
[facility] Discharge Instructions for Patients Receiving Chemotherapy  Today you received the following chemotherapy agents: Paclitaxel (Taxol)  To help prevent nausea and vomiting after your treatment, we encourage you to take your nausea medication as prescribed. If you develop nausea and vomiting that is not controlled by your nausea medication, call the clinic.   BELOW ARE SYMPTOMS THAT SHOULD BE REPORTED IMMEDIATELY:  *FEVER GREATER THAN 100.5 F  *CHILLS WITH OR WITHOUT FEVER  NAUSEA AND VOMITING THAT IS NOT CONTROLLED WITH YOUR NAUSEA MEDICATION  *UNUSUAL SHORTNESS OF BREATH  *UNUSUAL BRUISING OR BLEEDING  TENDERNESS IN MOUTH AND THROAT WITH OR WITHOUT PRESENCE OF ULCERS  *URINARY PROBLEMS  *BOWEL PROBLEMS  UNUSUAL RASH Items with * indicate a potential emergency and should be followed up as soon as possible.  Feel free to call the clinic should you have any questions or concerns. The clinic phone number is [phone].  Please show the CHEMO ALERT CARD at check-in to the Emergency Department and triage nurse.   

## 2018-01-15 ENCOUNTER — Inpatient Hospital Stay: Payer: BLUE CROSS/BLUE SHIELD

## 2018-01-15 ENCOUNTER — Inpatient Hospital Stay (HOSPITAL_BASED_OUTPATIENT_CLINIC_OR_DEPARTMENT_OTHER): Payer: BLUE CROSS/BLUE SHIELD | Admitting: Hematology and Oncology

## 2018-01-15 DIAGNOSIS — C50412 Malignant neoplasm of upper-outer quadrant of left female breast: Secondary | ICD-10-CM

## 2018-01-15 DIAGNOSIS — Z5111 Encounter for antineoplastic chemotherapy: Secondary | ICD-10-CM | POA: Diagnosis not present

## 2018-01-15 DIAGNOSIS — Z170 Estrogen receptor positive status [ER+]: Principal | ICD-10-CM

## 2018-01-15 DIAGNOSIS — Z79899 Other long term (current) drug therapy: Secondary | ICD-10-CM

## 2018-01-15 DIAGNOSIS — Z95828 Presence of other vascular implants and grafts: Secondary | ICD-10-CM

## 2018-01-15 DIAGNOSIS — R232 Flushing: Secondary | ICD-10-CM

## 2018-01-15 DIAGNOSIS — R490 Dysphonia: Secondary | ICD-10-CM

## 2018-01-15 DIAGNOSIS — R5383 Other fatigue: Secondary | ICD-10-CM | POA: Diagnosis not present

## 2018-01-15 DIAGNOSIS — G629 Polyneuropathy, unspecified: Secondary | ICD-10-CM

## 2018-01-15 LAB — CBC WITH DIFFERENTIAL/PLATELET
BASOS ABS: 0 10*3/uL (ref 0.0–0.1)
Basophils Relative: 1 %
EOS ABS: 0.1 10*3/uL (ref 0.0–0.5)
EOS PCT: 2 %
HCT: 27.8 % — ABNORMAL LOW (ref 34.8–46.6)
Hemoglobin: 9.4 g/dL — ABNORMAL LOW (ref 11.6–15.9)
Lymphocytes Relative: 23 %
Lymphs Abs: 0.6 10*3/uL — ABNORMAL LOW (ref 0.9–3.3)
MCH: 32.7 pg (ref 25.1–34.0)
MCHC: 33.8 g/dL (ref 31.5–36.0)
MCV: 96.8 fL (ref 79.5–101.0)
MONO ABS: 0.2 10*3/uL (ref 0.1–0.9)
MONOS PCT: 9 %
Neutro Abs: 1.8 10*3/uL (ref 1.5–6.5)
Neutrophils Relative %: 65 %
PLATELETS: 195 10*3/uL (ref 145–400)
RBC: 2.87 MIL/uL — ABNORMAL LOW (ref 3.70–5.45)
RDW: 16.6 % — AB (ref 11.2–14.5)
WBC: 2.7 10*3/uL — ABNORMAL LOW (ref 3.9–10.3)

## 2018-01-15 LAB — COMPREHENSIVE METABOLIC PANEL
ALBUMIN: 3.7 g/dL (ref 3.5–5.0)
ALT: 65 U/L — ABNORMAL HIGH (ref 0–55)
ANION GAP: 7 (ref 3–11)
AST: 37 U/L — ABNORMAL HIGH (ref 5–34)
Alkaline Phosphatase: 63 U/L (ref 40–150)
BILIRUBIN TOTAL: 0.4 mg/dL (ref 0.2–1.2)
BUN: 10 mg/dL (ref 7–26)
CHLORIDE: 104 mmol/L (ref 98–109)
CO2: 29 mmol/L (ref 22–29)
Calcium: 9.3 mg/dL (ref 8.4–10.4)
Creatinine, Ser: 0.59 mg/dL — ABNORMAL LOW (ref 0.60–1.10)
GFR calc Af Amer: >60 mL/min (ref >60)
GFR calc non Af Amer: >60 mL/min (ref >60)
GLUCOSE: 81 mg/dL (ref 70–140)
POTASSIUM: 3.8 mmol/L (ref 3.5–5.1)
SODIUM: 140 mmol/L (ref 136–145)
Total Protein: 6.2 g/dL — ABNORMAL LOW (ref 6.4–8.3)

## 2018-01-15 MED ORDER — HEPARIN SOD (PORK) LOCK FLUSH 100 UNIT/ML IV SOLN
500.0000 [IU] | Freq: Once | INTRAVENOUS | Status: AC | PRN
  Administered 2018-01-15: 500 [IU]
  Filled 2018-01-15: qty 5

## 2018-01-15 MED ORDER — FAMOTIDINE IN NACL 20-0.9 MG/50ML-% IV SOLN
20.0000 mg | Freq: Once | INTRAVENOUS | Status: AC
  Administered 2018-01-15: 20 mg via INTRAVENOUS

## 2018-01-15 MED ORDER — DIPHENHYDRAMINE HCL 50 MG/ML IJ SOLN
50.0000 mg | Freq: Once | INTRAMUSCULAR | Status: AC
  Administered 2018-01-15: 50 mg via INTRAVENOUS

## 2018-01-15 MED ORDER — SODIUM CHLORIDE 0.9 % IV SOLN
80.0000 mg/m2 | Freq: Once | INTRAVENOUS | Status: AC
  Administered 2018-01-15: 138 mg via INTRAVENOUS
  Filled 2018-01-15: qty 23

## 2018-01-15 MED ORDER — SODIUM CHLORIDE 0.9 % IV SOLN
Freq: Once | INTRAVENOUS | Status: AC
  Administered 2018-01-15: 14:00:00 via INTRAVENOUS

## 2018-01-15 MED ORDER — SODIUM CHLORIDE 0.9% FLUSH
10.0000 mL | INTRAVENOUS | Status: DC | PRN
  Administered 2018-01-15: 10 mL
  Filled 2018-01-15: qty 10

## 2018-01-15 MED ORDER — SODIUM CHLORIDE 0.9% FLUSH
10.0000 mL | Freq: Once | INTRAVENOUS | Status: AC
  Administered 2018-01-15: 10 mL
  Filled 2018-01-15: qty 10

## 2018-01-15 MED ORDER — FAMOTIDINE IN NACL 20-0.9 MG/50ML-% IV SOLN
INTRAVENOUS | Status: AC
  Filled 2018-01-15: qty 50

## 2018-01-15 MED ORDER — DIPHENHYDRAMINE HCL 50 MG/ML IJ SOLN
INTRAMUSCULAR | Status: AC
  Filled 2018-01-15: qty 1

## 2018-01-15 MED ORDER — SODIUM CHLORIDE 0.9 % IV SOLN
20.0000 mg | Freq: Once | INTRAVENOUS | Status: AC
  Administered 2018-01-15: 20 mg via INTRAVENOUS
  Filled 2018-01-15: qty 2

## 2018-01-15 NOTE — Progress Notes (Signed)
Patient Care Team: [name], MD as PCP - General (Family Medicine)  DIAGNOSIS:  Encounter Diagnosis  Name Primary?  . Malignant neoplasm of upper-outer quadrant of left breast in female, estrogen receptor positive ([place])     SUMMARY OF ONCOLOGIC HISTORY:   Malignant neoplasm of upper-outer quadrant of left breast in female, estrogen receptor positive ([place])   08/29/2017 Initial Diagnosis    Screening detected left breast mass 4 cm at 12 o'clock position, 1.4 cm lymph node the axilla, biopsy of the mass and the lymph nodes were positive for IDC with DCIS grade 3 ER 70% PR 0%, KI 2%, HER-2 negative ratio 1.77, T2N1 stage IIIa clinical stage AJCC 8      09/11/2017 Breast MRI    Left breast malignancy with associated linear NME extending posteriorly as well as satellite nodules measures up to 5.3 cm, at least 4 abnormal left axillary lymph nodes      09/11/2017 Imaging    CT CAP and Bone scan negative for metastatic disease      09/18/2017 Miscellaneous    Mammaprint high risk luminal type B.  Average 10-year risk of recurrence untreated 29%; with chemotherapy and hormonal therapy distant metastasis free interval at 5 years 94.6%      10/02/2017 -  Neo-Adjuvant Chemotherapy    Neoadjuvant chemotherapy with dose dense Adriamycin and Cytoxan x4 followed by Taxol weekly x12       CHIEF COMPLIANT: Cycle 8 Taxol  INTERVAL HISTORY: Tonya Jackson is a 52 year old with above-mentioned history of left breast cancer currently on neoadjuvant chemotherapy today cycle 8 of Taxol.  Overall she is tolerating chemotherapy fairly well.  She did have flushing of the face and loss of voice for 1 day after he each of her previous chemotherapy treatments.  She also has fatigue.  Denies any nausea or vomiting.  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  REVIEW OF SYSTEMS:   Constitutional: Denies fevers, chills or abnormal weight loss Eyes: Denies blurriness of  vision Ears, nose, mouth, throat, and face: Denies mucositis or sore throat Respiratory: Denies cough, dyspnea or wheezes Cardiovascular: Denies palpitation, chest discomfort Gastrointestinal:  Denies nausea, heartburn or change in bowel habits Skin: Denies abnormal skin rashes Lymphatics: Denies new lymphadenopathy or easy bruising Neurological: Neuropathy in the index finger and thumbs of both hands Behavioral/Psych: Mood is stable, no new changes  Extremities: No lower extremity edema  All other systems were reviewed with the patient and are negative.  I have reviewed the past medical history, past surgical history, social history and family history with the patient and they are unchanged from previous note.  ALLERGIES:  is allergic to other.  MEDICATIONS:  Current Outpatient Medications  Medication Sig Dispense Refill  . lidocaine-prilocaine (EMLA) cream Apply to affected area once 30 g 3  . LORazepam (ATIVAN) 0.5 MG tablet Take 1 tablet (0.5 mg total) by mouth at bedtime as needed (Nausea or vomiting). (Patient not taking: Reported on 12/18/2017) 30 tablet 0  . ondansetron (ZOFRAN) 8 MG tablet Take 1 tablet (8 mg total) by mouth 2 (two) times daily as needed. Start on the third day after chemotherapy. (Patient not taking: Reported on 12/18/2017) 30 tablet 1  . prochlorperazine (COMPAZINE) 10 MG tablet Take 1 tablet (10 mg total) by mouth every 6 (six) hours as needed (Nausea or vomiting). (Patient not taking: Reported on 12/18/2017) 30 tablet 1   No current facility-administered medications for this visit.  PHYSICAL EXAMINATION: ECOG PERFORMANCE STATUS: 1 - Symptomatic but completely ambulatory  Vitals:   01/15/18 1334  BP: 104/73  Pulse: 86  Resp: 18  Temp: 98.2 F (36.8 C)  SpO2: 100%   Filed Weights   01/15/18 1334  Weight: 132 lb 1.6 oz (59.9 kg)    GENERAL:alert, no distress and comfortable SKIN: skin color, texture, turgor are normal, no rashes or significant  lesions EYES: normal, Conjunctiva are pink and non-injected, sclera clear OROPHARYNX:no exudate, no erythema and lips, buccal mucosa, and tongue normal  NECK: supple, thyroid normal size, non-tender, without nodularity LYMPH:  no palpable lymphadenopathy in the cervical, axillary or inguinal LUNGS: clear to auscultation and percussion with normal breathing effort HEART: regular rate & rhythm and no murmurs and no lower extremity edema ABDOMEN:abdomen soft, non-tender and normal bowel sounds MUSCULOSKELETAL:no cyanosis of digits and no clubbing  NEURO: alert & oriented x 3 with fluent speech, no focal motor/sensory deficits EXTREMITIES: No lower extremity edema  LABORATORY DATA:  I have reviewed the data as listed CMP Latest Ref Rng & Units 01/08/2018 01/01/2018 12/25/2017  Glucose 70 - 140 mg/dL 85 81 87  BUN 7 - 26 mg/dL '8 7 7  '$ Creatinine 0.60 - 1.10 mg/dL 0.61 0.63 0.63  Sodium 136 - 145 mmol/L 137 138 138  Potassium 3.5 - 5.1 mmol/L 3.9 4.0 3.6  Chloride 98 - 109 mmol/L 103 104 103  CO2 22 - 29 mmol/L '27 29 27  '$ Calcium 8.4 - 10.4 mg/dL 9.3 9.5 9.3  Total Protein 6.4 - 8.3 g/dL 6.4 6.5 6.7  Total Bilirubin 0.2 - 1.2 mg/dL 0.5 0.5 0.6  Alkaline Phos 40 - 150 U/L 65 60 61  AST 5 - 34 U/L 34 34 33  ALT 0 - 55 U/L 66(H) 55 48    Lab Results  Component Value Date   WBC 2.7 (L) 01/15/2018   HGB 9.4 (L) 01/15/2018   HCT 27.8 (L) 01/15/2018   MCV 96.8 01/15/2018   PLT 195 01/15/2018   NEUTROABS 1.8 01/15/2018    ASSESSMENT & PLAN:  Malignant neoplasm of upper-outer quadrant of left breast in female, estrogen receptor positive ([place]) 08/29/2017: Screening detected left breast mass 4 cm at 12 o'clock position, 1.4 cm lymph node the axilla, biopsy of the mass and the lymph nodes were positive for IDC with DCIS grade 3 ER 70% PR 0%, KI 2%, HER-2 negative ratio 1.77, T2N1 stage IIIa clinical stage AJCC 8  Mammaprinthigh risk luminal type B. Average 10-year risk of recurrence untreated  29%; with chemotherapy and hormonal therapy distant metastasis free interval at 5 years 94.6%  09/11/2017 breast [id number] breast malignancy with associated linear NME extending posteriorly as well as satellite nodules measures up to 5.3 cm, at least 4 abnormal left axillary lymph nodes  Recommendation: CT scans negative for metastatic disease 1.Neoadjuvant chemotherapy with dose dense Adriamycin and Cytoxan followed by Taxol x12 2. Followed by breast conserving surgery and targeted lymph node dissection 3. Followed by radiation 4. Follow-up antiestrogen therapy -------------------------------------------------------------------- Current treatment: Completed 4 cycles of dose dense Adriamycin and Cytoxan, today is cycle8 Taxol neoadjuvantly Echocardiogram 09/24/2017: EF 55-60%  Toxicities of chemotherapy: 1.  Redness of the cheeks 2. loss of voice 3.  Neuropathy in the thumb and index finger of both her hands   Monitoring closely for chemo toxicities. Return to clinic weekly for chemo and every 2 weeks for follow-up with me.    I spent 25 minutes talking to the patient of which  more than half was spent in counseling and coordination of care.  No orders of the defined types were placed in this encounter.  The patient has a good understanding of the overall plan. she agrees with it. she will call with any problems that may develop before the next visit here.   Harriette Ohara, MD 01/15/18

## 2018-01-15 NOTE — Assessment & Plan Note (Addendum)
08/29/2017: Screening detected left breast mass 4 cm at 12 o'clock position, 1.4 cm lymph node the axilla, biopsy of the mass and the lymph nodes were positive for IDC with DCIS grade 3 ER 70% PR 0%, KI 2%, HER-2 negative ratio 1.77, T2N1 stage IIIa clinical stage AJCC 8  Mammaprinthigh risk luminal type B. Average 10-year risk of recurrence untreated 29%; with chemotherapy and hormonal therapy distant metastasis free interval at 5 years 94.6%  09/11/2017 breast [id number] breast malignancy with associated linear NME extending posteriorly as well as satellite nodules measures up to 5.3 cm, at least 4 abnormal left axillary lymph nodes  Recommendation: CT scans negative for metastatic disease 1.Neoadjuvant chemotherapy with dose dense Adriamycin and Cytoxan followed by Taxol x12 2. Followed by breast conserving surgery and targeted lymph node dissection 3. Followed by radiation 4. Follow-up antiestrogen therapy -------------------------------------------------------------------- Current treatment: Completed 4 cycles of dose dense Adriamycin and Cytoxan, today is cycle8 Taxol neoadjuvantly Echocardiogram 09/24/2017: EF 55-60%  Toxicities of chemotherapy: 1.  Redness of the cheeks 2. loss of voice 3.  Neuropathy in the thumb and index finger of both her hands   Monitoring closely for chemo toxicities. Return to clinic weekly for chemo and every 2 weeks for follow-up with me.

## 2018-01-15 NOTE — Patient Instructions (Signed)
[facility] Discharge Instructions for Patients Receiving Chemotherapy  Today you received the following chemotherapy agents: Paclitaxel (Taxol)  To help prevent nausea and vomiting after your treatment, we encourage you to take your nausea medication as prescribed. If you develop nausea and vomiting that is not controlled by your nausea medication, call the clinic.   BELOW ARE SYMPTOMS THAT SHOULD BE REPORTED IMMEDIATELY:  *FEVER GREATER THAN 100.5 F  *CHILLS WITH OR WITHOUT FEVER  NAUSEA AND VOMITING THAT IS NOT CONTROLLED WITH YOUR NAUSEA MEDICATION  *UNUSUAL SHORTNESS OF BREATH  *UNUSUAL BRUISING OR BLEEDING  TENDERNESS IN MOUTH AND THROAT WITH OR WITHOUT PRESENCE OF ULCERS  *URINARY PROBLEMS  *BOWEL PROBLEMS  UNUSUAL RASH Items with * indicate a potential emergency and should be followed up as soon as possible.  Feel free to call the clinic should you have any questions or concerns. The clinic phone number is [phone].  Please show the CHEMO ALERT CARD at check-in to the Emergency Department and triage nurse.   

## 2018-01-16 ENCOUNTER — Telehealth: Payer: Self-pay | Admitting: Hematology and Oncology

## 2018-01-16 NOTE — Telephone Encounter (Signed)
Spoke to patient regarding upcoming April appointment updates.

## 2018-01-22 ENCOUNTER — Inpatient Hospital Stay: Payer: BLUE CROSS/BLUE SHIELD

## 2018-01-22 ENCOUNTER — Inpatient Hospital Stay: Payer: BLUE CROSS/BLUE SHIELD | Attending: Hematology and Oncology

## 2018-01-22 VITALS — BP 114/79 | HR 97 | Temp 98.1°F | Resp 17

## 2018-01-22 DIAGNOSIS — D759 Disease of blood and blood-forming organs, unspecified: Secondary | ICD-10-CM | POA: Diagnosis not present

## 2018-01-22 DIAGNOSIS — R5383 Other fatigue: Secondary | ICD-10-CM | POA: Insufficient documentation

## 2018-01-22 DIAGNOSIS — G629 Polyneuropathy, unspecified: Secondary | ICD-10-CM | POA: Diagnosis not present

## 2018-01-22 DIAGNOSIS — Z170 Estrogen receptor positive status [ER+]: Secondary | ICD-10-CM

## 2018-01-22 DIAGNOSIS — Z5111 Encounter for antineoplastic chemotherapy: Secondary | ICD-10-CM | POA: Insufficient documentation

## 2018-01-22 DIAGNOSIS — Z9221 Personal history of antineoplastic chemotherapy: Secondary | ICD-10-CM | POA: Insufficient documentation

## 2018-01-22 DIAGNOSIS — Z923 Personal history of irradiation: Secondary | ICD-10-CM | POA: Diagnosis not present

## 2018-01-22 DIAGNOSIS — Z79899 Other long term (current) drug therapy: Secondary | ICD-10-CM | POA: Diagnosis not present

## 2018-01-22 DIAGNOSIS — C50412 Malignant neoplasm of upper-outer quadrant of left female breast: Secondary | ICD-10-CM | POA: Diagnosis not present

## 2018-01-22 DIAGNOSIS — Z95828 Presence of other vascular implants and grafts: Secondary | ICD-10-CM

## 2018-01-22 DIAGNOSIS — R200 Anesthesia of skin: Secondary | ICD-10-CM | POA: Insufficient documentation

## 2018-01-22 LAB — CBC WITH DIFFERENTIAL/PLATELET
BASOS ABS: 0 10*3/uL (ref 0.0–0.1)
BASOS PCT: 0 %
EOS ABS: 0.1 10*3/uL (ref 0.0–0.5)
Eosinophils Relative: 2 %
HEMATOCRIT: 29.1 % — AB (ref 34.8–46.6)
HEMOGLOBIN: 9.5 g/dL — AB (ref 11.6–15.9)
Lymphocytes Relative: 24 %
Lymphs Abs: 0.8 10*3/uL — ABNORMAL LOW (ref 0.9–3.3)
MCH: 31.9 pg (ref 25.1–34.0)
MCHC: 32.6 g/dL (ref 31.5–36.0)
MCV: 97.7 fL (ref 79.5–101.0)
MONOS PCT: 6 %
Monocytes Absolute: 0.2 10*3/uL (ref 0.1–0.9)
NEUTROS ABS: 2.1 10*3/uL (ref 1.5–6.5)
NEUTROS PCT: 68 %
Platelets: 190 10*3/uL (ref 145–400)
RBC: 2.98 MIL/uL — ABNORMAL LOW (ref 3.70–5.45)
RDW: 14.6 % — ABNORMAL HIGH (ref 11.2–14.5)
WBC: 3.1 10*3/uL — AB (ref 3.9–10.3)

## 2018-01-22 LAB — COMPREHENSIVE METABOLIC PANEL
ALT: 68 U/L — ABNORMAL HIGH (ref 0–55)
AST: 33 U/L (ref 5–34)
Albumin: 3.7 g/dL (ref 3.5–5.0)
Alkaline Phosphatase: 68 U/L (ref 40–150)
Anion gap: 6 (ref 3–11)
BUN: 8 mg/dL (ref 7–26)
CHLORIDE: 104 mmol/L (ref 98–109)
CO2: 29 mmol/L (ref 22–29)
Calcium: 9 mg/dL (ref 8.4–10.4)
Creatinine, Ser: 0.59 mg/dL — ABNORMAL LOW (ref 0.60–1.10)
GFR calc Af Amer: >60 mL/min (ref >60)
GLUCOSE: 84 mg/dL (ref 70–140)
Potassium: 3.7 mmol/L (ref 3.5–5.1)
SODIUM: 139 mmol/L (ref 136–145)
Total Bilirubin: 0.4 mg/dL (ref 0.2–1.2)
Total Protein: 6.2 g/dL — ABNORMAL LOW (ref 6.4–8.3)

## 2018-01-22 MED ORDER — FAMOTIDINE IN NACL 20-0.9 MG/50ML-% IV SOLN
20.0000 mg | Freq: Once | INTRAVENOUS | Status: AC
  Administered 2018-01-22: 20 mg via INTRAVENOUS

## 2018-01-22 MED ORDER — FAMOTIDINE IN NACL 20-0.9 MG/50ML-% IV SOLN
INTRAVENOUS | Status: AC
  Filled 2018-01-22: qty 50

## 2018-01-22 MED ORDER — SODIUM CHLORIDE 0.9 % IV SOLN
20.0000 mg | Freq: Once | INTRAVENOUS | Status: AC
  Administered 2018-01-22: 20 mg via INTRAVENOUS
  Filled 2018-01-22: qty 2

## 2018-01-22 MED ORDER — HEPARIN SOD (PORK) LOCK FLUSH 100 UNIT/ML IV SOLN
500.0000 [IU] | Freq: Once | INTRAVENOUS | Status: AC | PRN
  Administered 2018-01-22: 500 [IU]
  Filled 2018-01-22: qty 5

## 2018-01-22 MED ORDER — DIPHENHYDRAMINE HCL 50 MG/ML IJ SOLN
INTRAMUSCULAR | Status: AC
  Filled 2018-01-22: qty 1

## 2018-01-22 MED ORDER — SODIUM CHLORIDE 0.9% FLUSH
10.0000 mL | Freq: Once | INTRAVENOUS | Status: AC
  Administered 2018-01-22: 10 mL
  Filled 2018-01-22: qty 10

## 2018-01-22 MED ORDER — SODIUM CHLORIDE 0.9% FLUSH
10.0000 mL | INTRAVENOUS | Status: DC | PRN
  Administered 2018-01-22: 10 mL
  Filled 2018-01-22: qty 10

## 2018-01-22 MED ORDER — DIPHENHYDRAMINE HCL 50 MG/ML IJ SOLN
50.0000 mg | Freq: Once | INTRAMUSCULAR | Status: AC
  Administered 2018-01-22: 50 mg via INTRAVENOUS

## 2018-01-22 MED ORDER — SODIUM CHLORIDE 0.9 % IV SOLN
Freq: Once | INTRAVENOUS | Status: AC
  Administered 2018-01-22: 15:00:00 via INTRAVENOUS

## 2018-01-22 MED ORDER — SODIUM CHLORIDE 0.9 % IV SOLN
80.0000 mg/m2 | Freq: Once | INTRAVENOUS | Status: AC
  Administered 2018-01-22: 138 mg via INTRAVENOUS
  Filled 2018-01-22: qty 23

## 2018-01-22 NOTE — Patient Instructions (Signed)
[facility] Discharge Instructions for Patients Receiving Chemotherapy  Today you received the following chemotherapy agents: Paclitaxel (Taxol)  To help prevent nausea and vomiting after your treatment, we encourage you to take your nausea medication as prescribed. If you develop nausea and vomiting that is not controlled by your nausea medication, call the clinic.   BELOW ARE SYMPTOMS THAT SHOULD BE REPORTED IMMEDIATELY:  *FEVER GREATER THAN 100.5 F  *CHILLS WITH OR WITHOUT FEVER  NAUSEA AND VOMITING THAT IS NOT CONTROLLED WITH YOUR NAUSEA MEDICATION  *UNUSUAL SHORTNESS OF BREATH  *UNUSUAL BRUISING OR BLEEDING  TENDERNESS IN MOUTH AND THROAT WITH OR WITHOUT PRESENCE OF ULCERS  *URINARY PROBLEMS  *BOWEL PROBLEMS  UNUSUAL RASH Items with * indicate a potential emergency and should be followed up as soon as possible.  Feel free to call the clinic should you have any questions or concerns. The clinic phone number is [phone].  Please show the CHEMO ALERT CARD at check-in to the Emergency Department and triage nurse.   

## 2018-01-28 NOTE — Assessment & Plan Note (Signed)
08/29/2017: Screening detected left breast mass 4 cm at 12 o'clock position, 1.4 cm lymph node the axilla, biopsy of the mass and the lymph nodes were positive for IDC with DCIS grade 3 ER 70% PR 0%, KI 2%, HER-2 negative ratio 1.77, T2N1 stage IIIa clinical stage AJCC 8  Mammaprinthigh risk luminal type B. Average 10-year risk of recurrence untreated 29%; with chemotherapy and hormonal therapy distant metastasis free interval at 5 years 94.6%  09/11/2017 breast LDK:[id number] breast malignancy with associated linear NME extending posteriorly as well as satellite nodules measures up to 5.3 cm, at least 4 abnormal left axillary lymph nodes  Recommendation: CT scans negative for metastatic disease 1.Neoadjuvant chemotherapy with dose dense Adriamycin and Cytoxan followed by Taxol x12 2. Followed by breast conserving surgery and targeted lymph node dissection 3. Followed by radiation 4. Follow-up antiestrogen therapy -------------------------------------------------------------------- Current treatment: Completed 4 cycles of dose dense Adriamycin and Cytoxan, today is cycle10Taxol neoadjuvantly Echocardiogram 09/24/2017: EF 55-60%  Toxicities of chemotherapy: 1.  Redness of the cheeks 2. loss of voice 3.  Neuropathy in the thumb and index finger of both her hands   Monitoring closely for chemo toxicities. Return to clinic weekly for chemo and every 2 weeks for follow-up with me.

## 2018-01-29 ENCOUNTER — Other Ambulatory Visit: Payer: BLUE CROSS/BLUE SHIELD

## 2018-01-29 ENCOUNTER — Telehealth: Payer: Self-pay | Admitting: Hematology and Oncology

## 2018-01-29 ENCOUNTER — Inpatient Hospital Stay: Payer: BLUE CROSS/BLUE SHIELD

## 2018-01-29 ENCOUNTER — Inpatient Hospital Stay (HOSPITAL_BASED_OUTPATIENT_CLINIC_OR_DEPARTMENT_OTHER): Payer: BLUE CROSS/BLUE SHIELD | Admitting: Hematology and Oncology

## 2018-01-29 DIAGNOSIS — Z79899 Other long term (current) drug therapy: Secondary | ICD-10-CM | POA: Diagnosis not present

## 2018-01-29 DIAGNOSIS — R200 Anesthesia of skin: Secondary | ICD-10-CM | POA: Diagnosis not present

## 2018-01-29 DIAGNOSIS — D759 Disease of blood and blood-forming organs, unspecified: Secondary | ICD-10-CM | POA: Diagnosis not present

## 2018-01-29 DIAGNOSIS — G629 Polyneuropathy, unspecified: Secondary | ICD-10-CM | POA: Diagnosis not present

## 2018-01-29 DIAGNOSIS — Z923 Personal history of irradiation: Secondary | ICD-10-CM | POA: Diagnosis not present

## 2018-01-29 DIAGNOSIS — C50412 Malignant neoplasm of upper-outer quadrant of left female breast: Secondary | ICD-10-CM

## 2018-01-29 DIAGNOSIS — Z5111 Encounter for antineoplastic chemotherapy: Secondary | ICD-10-CM | POA: Diagnosis not present

## 2018-01-29 DIAGNOSIS — Z170 Estrogen receptor positive status [ER+]: Secondary | ICD-10-CM | POA: Diagnosis not present

## 2018-01-29 DIAGNOSIS — R5383 Other fatigue: Secondary | ICD-10-CM

## 2018-01-29 DIAGNOSIS — Z9221 Personal history of antineoplastic chemotherapy: Secondary | ICD-10-CM

## 2018-01-29 DIAGNOSIS — Z95828 Presence of other vascular implants and grafts: Secondary | ICD-10-CM

## 2018-01-29 LAB — CBC WITH DIFFERENTIAL/PLATELET
BASOS PCT: 0 %
Basophils Absolute: 0 10*3/uL (ref 0.0–0.1)
EOS ABS: 0.1 10*3/uL (ref 0.0–0.5)
Eosinophils Relative: 2 %
HCT: 31 % — ABNORMAL LOW (ref 34.8–46.6)
Hemoglobin: 10 g/dL — ABNORMAL LOW (ref 11.6–15.9)
Lymphocytes Relative: 21 %
Lymphs Abs: 0.6 10*3/uL — ABNORMAL LOW (ref 0.9–3.3)
MCH: 31.3 pg (ref 25.1–34.0)
MCHC: 32.3 g/dL (ref 31.5–36.0)
MCV: 96.9 fL (ref 79.5–101.0)
MONO ABS: 0.3 10*3/uL (ref 0.1–0.9)
MONOS PCT: 9 %
Neutro Abs: 2 10*3/uL (ref 1.5–6.5)
Neutrophils Relative %: 68 %
Platelets: 195 10*3/uL (ref 145–400)
RBC: 3.2 MIL/uL — ABNORMAL LOW (ref 3.70–5.45)
RDW: 14.4 % (ref 11.2–14.5)
WBC: 2.9 10*3/uL — ABNORMAL LOW (ref 3.9–10.3)

## 2018-01-29 LAB — COMPREHENSIVE METABOLIC PANEL
ALBUMIN: 3.9 g/dL (ref 3.5–5.0)
ALK PHOS: 66 U/L (ref 40–150)
ALT: 68 U/L — ABNORMAL HIGH (ref 0–55)
AST: 31 U/L (ref 5–34)
Anion gap: 8 (ref 3–11)
BUN: 8 mg/dL (ref 7–26)
CALCIUM: 9.3 mg/dL (ref 8.4–10.4)
CO2: 29 mmol/L (ref 22–29)
CREATININE: 0.65 mg/dL (ref 0.60–1.10)
Chloride: 104 mmol/L (ref 98–109)
GFR calc Af Amer: >60 mL/min (ref >60)
GFR calc non Af Amer: >60 mL/min (ref >60)
GLUCOSE: 88 mg/dL (ref 70–140)
Potassium: 4 mmol/L (ref 3.5–5.1)
SODIUM: 141 mmol/L (ref 136–145)
Total Bilirubin: 0.4 mg/dL (ref 0.2–1.2)
Total Protein: 6.5 g/dL (ref 6.4–8.3)

## 2018-01-29 MED ORDER — SODIUM CHLORIDE 0.9 % IV SOLN
20.0000 mg | Freq: Once | INTRAVENOUS | Status: AC
  Administered 2018-01-29: 20 mg via INTRAVENOUS
  Filled 2018-01-29: qty 2

## 2018-01-29 MED ORDER — HEPARIN SOD (PORK) LOCK FLUSH 100 UNIT/ML IV SOLN
500.0000 [IU] | Freq: Once | INTRAVENOUS | Status: AC | PRN
  Administered 2018-01-29: 500 [IU]
  Filled 2018-01-29: qty 5

## 2018-01-29 MED ORDER — DIPHENHYDRAMINE HCL 50 MG/ML IJ SOLN
50.0000 mg | Freq: Once | INTRAMUSCULAR | Status: AC
  Administered 2018-01-29: 50 mg via INTRAVENOUS

## 2018-01-29 MED ORDER — FAMOTIDINE IN NACL 20-0.9 MG/50ML-% IV SOLN
20.0000 mg | Freq: Once | INTRAVENOUS | Status: AC
  Administered 2018-01-29: 20 mg via INTRAVENOUS

## 2018-01-29 MED ORDER — SODIUM CHLORIDE 0.9 % IV SOLN
80.0000 mg/m2 | Freq: Once | INTRAVENOUS | Status: AC
  Administered 2018-01-29: 138 mg via INTRAVENOUS
  Filled 2018-01-29: qty 23

## 2018-01-29 MED ORDER — SODIUM CHLORIDE 0.9% FLUSH
10.0000 mL | INTRAVENOUS | Status: DC | PRN
Start: 2018-01-29 — End: 2018-01-29
  Administered 2018-01-29: 10 mL
  Filled 2018-01-29: qty 10

## 2018-01-29 MED ORDER — SODIUM CHLORIDE 0.9% FLUSH
10.0000 mL | Freq: Once | INTRAVENOUS | Status: AC
  Administered 2018-01-29: 10 mL
  Filled 2018-01-29: qty 10

## 2018-01-29 MED ORDER — SODIUM CHLORIDE 0.9 % IV SOLN
Freq: Once | INTRAVENOUS | Status: AC
  Administered 2018-01-29: 15:00:00 via INTRAVENOUS

## 2018-01-29 MED ORDER — DIPHENHYDRAMINE HCL 50 MG/ML IJ SOLN
INTRAMUSCULAR | Status: AC
  Filled 2018-01-29: qty 1

## 2018-01-29 NOTE — Patient Instructions (Signed)
[facility] Discharge Instructions for Patients Receiving Chemotherapy  Today you received the following chemotherapy agents:  Taxol.  To help prevent nausea and vomiting after your treatment, we encourage you to take your nausea medication as directed.   If you develop nausea and vomiting that is not controlled by your nausea medication, call the clinic.   BELOW ARE SYMPTOMS THAT SHOULD BE REPORTED IMMEDIATELY:  *FEVER GREATER THAN 100.5 F  *CHILLS WITH OR WITHOUT FEVER  NAUSEA AND VOMITING THAT IS NOT CONTROLLED WITH YOUR NAUSEA MEDICATION  *UNUSUAL SHORTNESS OF BREATH  *UNUSUAL BRUISING OR BLEEDING  TENDERNESS IN MOUTH AND THROAT WITH OR WITHOUT PRESENCE OF ULCERS  *URINARY PROBLEMS  *BOWEL PROBLEMS  UNUSUAL RASH Items with * indicate a potential emergency and should be followed up as soon as possible.  Feel free to call the clinic should you have any questions or concerns. The clinic phone number is [phone].  Please show the CHEMO ALERT CARD at check-in to the Emergency Department and triage nurse.   

## 2018-01-29 NOTE — Telephone Encounter (Signed)
Gave avs and calendar ° °

## 2018-01-29 NOTE — Progress Notes (Signed)
Patient Care Team: [name], MD as PCP - General (Family Medicine)  DIAGNOSIS:  Encounter Diagnosis  Name Primary?  . Malignant neoplasm of upper-outer quadrant of left breast in female, estrogen receptor positive ([place])     SUMMARY OF ONCOLOGIC HISTORY:   Malignant neoplasm of upper-outer quadrant of left breast in female, estrogen receptor positive ([place])   08/29/2017 Initial Diagnosis    Screening detected left breast mass 4 cm at 12 o'clock position, 1.4 cm lymph node the axilla, biopsy of the mass and the lymph nodes were positive for IDC with DCIS grade 3 ER 70% PR 0%, KI 2%, HER-2 negative ratio 1.77, T2N1 stage IIIa clinical stage AJCC 8      09/11/2017 Breast MRI    Left breast malignancy with associated linear NME extending posteriorly as well as satellite nodules measures up to 5.3 cm, at least 4 abnormal left axillary lymph nodes      09/11/2017 Imaging    CT CAP and Bone scan negative for metastatic disease      09/18/2017 Miscellaneous    Mammaprint high risk luminal type B.  Average 10-year risk of recurrence untreated 29%; with chemotherapy and hormonal therapy distant metastasis free interval at 5 years 94.6%      10/02/2017 -  Neo-Adjuvant Chemotherapy    Neoadjuvant chemotherapy with dose dense Adriamycin and Cytoxan x4 followed by Taxol weekly x12       CHIEF COMPLIANT: Cycle 10 Taxol  INTERVAL HISTORY: Tonya Jackson is a 52 year old with above-mentioned history of left breast cancer currently on neoadjuvant chemotherapy and today's cycle 10 of Taxol.  She appears to be tolerating chemo extremely well.  She denies any significant neuropathy.  She has mild numbness of the tips of the fingers.  Does have mild fatigue as well.  Denies any nausea or vomiting.  She has noted nail changes with foul-smelling watery discharge coming from her left big toe nail.  REVIEW OF SYSTEMS:   Constitutional: Denies fevers, chills or abnormal weight loss Eyes: Denies  blurriness of vision Ears, nose, mouth, throat, and face: Denies mucositis or sore throat Respiratory: Denies cough, dyspnea or wheezes Cardiovascular: Denies palpitation, chest discomfort Gastrointestinal:  Denies nausea, heartburn or change in bowel habits Skin: Denies abnormal skin rashes Lymphatics: Denies new lymphadenopathy or easy bruising Neurological:Mild neuropathy Behavioral/Psych: Mood is stable, no new changes  Extremities: No lower extremity edema  All other systems were reviewed with the patient and are negative.  I have reviewed the past medical history, past surgical history, social history and family history with the patient and they are unchanged from previous note.  ALLERGIES:  is allergic to other.  MEDICATIONS:  Current Outpatient Medications  Medication Sig Dispense Refill  . lidocaine-prilocaine (EMLA) cream Apply to affected area once 30 g 3  . LORazepam (ATIVAN) 0.5 MG tablet Take 1 tablet (0.5 mg total) by mouth at bedtime as needed (Nausea or vomiting). (Patient not taking: Reported on 12/18/2017) 30 tablet 0  . ondansetron (ZOFRAN) 8 MG tablet Take 1 tablet (8 mg total) by mouth 2 (two) times daily as needed. Start on the third day after chemotherapy. (Patient not taking: Reported on 12/18/2017) 30 tablet 1  . prochlorperazine (COMPAZINE) 10 MG tablet Take 1 tablet (10 mg total) by mouth every 6 (six) hours as needed (Nausea or vomiting). (Patient not taking: Reported on 12/18/2017) 30 tablet 1   No current facility-administered medications for this visit.     PHYSICAL EXAMINATION: ECOG PERFORMANCE STATUS:  1 - Symptomatic but completely ambulatory  Vitals:   01/29/18 1252  BP: 110/60  Pulse: 69  Resp: 18  Temp: 98.5 F (36.9 C)  SpO2: 100%   Filed Weights   01/29/18 1252  Weight: 132 lb 12.8 oz (60.2 kg)    GENERAL:alert, no distress and comfortable SKIN: skin color, texture, turgor are normal, no rashes or significant lesions EYES: normal,  Conjunctiva are pink and non-injected, sclera clear OROPHARYNX:no exudate, no erythema and lips, buccal mucosa, and tongue normal  NECK: supple, thyroid normal size, non-tender, without nodularity LYMPH:  no palpable lymphadenopathy in the cervical, axillary or inguinal LUNGS: clear to auscultation and percussion with normal breathing effort HEART: regular rate & rhythm and no murmurs and no lower extremity edema ABDOMEN:abdomen soft, non-tender and normal bowel sounds MUSCULOSKELETAL:no cyanosis of digits and no clubbing  NEURO: alert & oriented x 3 with fluent speech, no focal motor/sensory deficits EXTREMITIES: No lower extremity edema  LABORATORY DATA:  I have reviewed the data as listed CMP Latest Ref Rng & Units 01/22/2018 01/15/2018 01/08/2018  Glucose 70 - 140 mg/dL 84 81 85  BUN 7 - 26 mg/dL '8 10 8  '$ Creatinine 0.60 - 1.10 mg/dL 0.59(L) 0.59(L) 0.61  Sodium 136 - 145 mmol/L 139 140 137  Potassium 3.5 - 5.1 mmol/L 3.7 3.8 3.9  Chloride 98 - 109 mmol/L 104 104 103  CO2 22 - 29 mmol/L '29 29 27  '$ Calcium 8.4 - 10.4 mg/dL 9.0 9.3 9.3  Total Protein 6.4 - 8.3 g/dL 6.2(L) 6.2(L) 6.4  Total Bilirubin 0.2 - 1.2 mg/dL 0.4 0.4 0.5  Alkaline Phos 40 - 150 U/L 68 63 65  AST 5 - 34 U/L 33 37(H) 34  ALT 0 - 55 U/L 68(H) 65(H) 66(H)    Lab Results  Component Value Date   WBC 2.9 (L) 01/29/2018   HGB 10.0 (L) 01/29/2018   HCT 31.0 (L) 01/29/2018   MCV 96.9 01/29/2018   PLT 195 01/29/2018   NEUTROABS 2.0 01/29/2018    ASSESSMENT & PLAN:  Malignant neoplasm of upper-outer quadrant of left breast in female, estrogen receptor positive ([place]) 08/29/2017: Screening detected left breast mass 4 cm at 12 o'clock position, 1.4 cm lymph node the axilla, biopsy of the mass and the lymph nodes were positive for IDC with DCIS grade 3 ER 70% PR 0%, KI 2%, HER-2 negative ratio 1.77, T2N1 stage IIIa clinical stage AJCC 8  Mammaprinthigh risk luminal type B. Average 10-year risk of recurrence untreated  29%; with chemotherapy and hormonal therapy distant metastasis free interval at 5 years 94.6%  09/11/2017 breast [id number] breast malignancy with associated linear NME extending posteriorly as well as satellite nodules measures up to 5.3 cm, at least 4 abnormal left axillary lymph nodes  Recommendation: CT scans negative for metastatic disease 1.Neoadjuvant chemotherapy with dose dense Adriamycin and Cytoxan followed by Taxol x12 2. Followed by breast conserving surgery and targeted lymph node dissection 3. Followed by radiation 4. Follow-up antiestrogen therapy -------------------------------------------------------------------- Current treatment: Completed 4 cycles of dose dense Adriamycin and Cytoxan, today is cycle10Taxol neoadjuvantly Echocardiogram 09/24/2017: EF 55-60%  Toxicities of chemotherapy: 1.  Redness of the cheeks 2. loss of voice 3.  Neuropathy in the thumb and index finger of both her hands  Right toenail discharge: Encouraged him to use topical antifungals.  Monitoring closely for chemo toxicities. Return to clinic weekly for chemo and every 2 weeks for follow-up with m Mendel Ryder. I ordered breast MRIs to be done on 02/14/2018  She will be presented at tumor board on 02/19/2018.  I will see her on the same day to discuss the scan results.    Orders Placed This Encounter  Procedures  . MR BREAST BILATERAL W WO CONTRAST INC CAD    Standing Status:   Future    Standing Expiration Date:   04/01/2019    Order Specific Question:   If indicated for the ordered procedure, I authorize the administration of contrast media per Radiology protocol    Answer:   Yes    Order Specific Question:   What is the patient's sedation requirement?    Answer:   No Sedation    Order Specific Question:   Does the patient have a pacemaker or implanted devices?    Answer:   No    Order Specific Question:   Radiology Contrast Protocol - do NOT remove file path    Answer:    [email or web address]    Order Specific Question:   Reason for Exam additional comments    Answer:   Post neoadj chemo evaluation    Order Specific Question:   Preferred imaging location?    Answer:   [facility] (table limit-350 lbs)   The patient has a good understanding of the overall plan. she agrees with it. she will call with any problems that may develop before the next visit here.   Harriette Ohara, MD 01/29/18

## 2018-02-05 ENCOUNTER — Inpatient Hospital Stay: Payer: BLUE CROSS/BLUE SHIELD

## 2018-02-05 VITALS — BP 114/76 | HR 89 | Temp 98.5°F | Resp 17

## 2018-02-05 DIAGNOSIS — Z170 Estrogen receptor positive status [ER+]: Secondary | ICD-10-CM | POA: Diagnosis not present

## 2018-02-05 DIAGNOSIS — Z923 Personal history of irradiation: Secondary | ICD-10-CM | POA: Diagnosis not present

## 2018-02-05 DIAGNOSIS — Z5111 Encounter for antineoplastic chemotherapy: Secondary | ICD-10-CM | POA: Diagnosis not present

## 2018-02-05 DIAGNOSIS — Z79899 Other long term (current) drug therapy: Secondary | ICD-10-CM | POA: Diagnosis not present

## 2018-02-05 DIAGNOSIS — Z9221 Personal history of antineoplastic chemotherapy: Secondary | ICD-10-CM | POA: Diagnosis not present

## 2018-02-05 DIAGNOSIS — C50412 Malignant neoplasm of upper-outer quadrant of left female breast: Secondary | ICD-10-CM

## 2018-02-05 DIAGNOSIS — Z95828 Presence of other vascular implants and grafts: Secondary | ICD-10-CM

## 2018-02-05 DIAGNOSIS — R5383 Other fatigue: Secondary | ICD-10-CM | POA: Diagnosis not present

## 2018-02-05 DIAGNOSIS — R200 Anesthesia of skin: Secondary | ICD-10-CM | POA: Diagnosis not present

## 2018-02-05 DIAGNOSIS — G629 Polyneuropathy, unspecified: Secondary | ICD-10-CM | POA: Diagnosis not present

## 2018-02-05 DIAGNOSIS — D759 Disease of blood and blood-forming organs, unspecified: Secondary | ICD-10-CM | POA: Diagnosis not present

## 2018-02-05 LAB — CBC WITH DIFFERENTIAL/PLATELET
Basophils Absolute: 0 10*3/uL (ref 0.0–0.1)
Basophils Relative: 1 %
EOS ABS: 0.1 10*3/uL (ref 0.0–0.5)
Eosinophils Relative: 2 %
HEMATOCRIT: 31.2 % — AB (ref 34.8–46.6)
HEMOGLOBIN: 10.5 g/dL — AB (ref 11.6–15.9)
LYMPHS ABS: 0.7 10*3/uL — AB (ref 0.9–3.3)
Lymphocytes Relative: 23 %
MCH: 31.7 pg (ref 25.1–34.0)
MCHC: 33.6 g/dL (ref 31.5–36.0)
MCV: 94.3 fL (ref 79.5–101.0)
Monocytes Absolute: 0.2 10*3/uL (ref 0.1–0.9)
Monocytes Relative: 8 %
NEUTROS PCT: 66 %
Neutro Abs: 2.1 10*3/uL (ref 1.5–6.5)
Platelets: 217 10*3/uL (ref 145–400)
RBC: 3.31 MIL/uL — ABNORMAL LOW (ref 3.70–5.45)
RDW: 15 % — ABNORMAL HIGH (ref 11.2–14.5)
WBC: 3.2 10*3/uL — ABNORMAL LOW (ref 3.9–10.3)

## 2018-02-05 LAB — COMPREHENSIVE METABOLIC PANEL
ALK PHOS: 67 U/L (ref 40–150)
ALT: 31 U/L (ref 0–55)
ANION GAP: 4 (ref 3–11)
AST: 21 U/L (ref 5–34)
Albumin: 4 g/dL (ref 3.5–5.0)
BILIRUBIN TOTAL: 0.5 mg/dL (ref 0.2–1.2)
BUN: 7 mg/dL (ref 7–26)
CALCIUM: 9.3 mg/dL (ref 8.4–10.4)
CO2: 30 mmol/L — ABNORMAL HIGH (ref 22–29)
Chloride: 105 mmol/L (ref 98–109)
Creatinine, Ser: 0.63 mg/dL (ref 0.60–1.10)
GFR calc non Af Amer: >60 mL/min (ref >60)
Glucose, Bld: 78 mg/dL (ref 70–140)
Potassium: 3.9 mmol/L (ref 3.5–5.1)
Sodium: 139 mmol/L (ref 136–145)
TOTAL PROTEIN: 6.7 g/dL (ref 6.4–8.3)

## 2018-02-05 MED ORDER — FAMOTIDINE IN NACL 20-0.9 MG/50ML-% IV SOLN
INTRAVENOUS | Status: AC
Start: 2018-02-05 — End: 2018-02-05
  Filled 2018-02-05: qty 50

## 2018-02-05 MED ORDER — DIPHENHYDRAMINE HCL 50 MG/ML IJ SOLN
INTRAMUSCULAR | Status: AC
  Filled 2018-02-05: qty 1

## 2018-02-05 MED ORDER — SODIUM CHLORIDE 0.9% FLUSH
10.0000 mL | INTRAVENOUS | Status: DC | PRN
Start: 2018-02-05 — End: 2018-02-05
  Administered 2018-02-05: 10 mL
  Filled 2018-02-05: qty 10

## 2018-02-05 MED ORDER — FAMOTIDINE IN NACL 20-0.9 MG/50ML-% IV SOLN
20.0000 mg | Freq: Once | INTRAVENOUS | Status: AC
  Administered 2018-02-05: 20 mg via INTRAVENOUS

## 2018-02-05 MED ORDER — DIPHENHYDRAMINE HCL 50 MG/ML IJ SOLN
50.0000 mg | Freq: Once | INTRAMUSCULAR | Status: AC
  Administered 2018-02-05: 50 mg via INTRAVENOUS

## 2018-02-05 MED ORDER — SODIUM CHLORIDE 0.9 % IV SOLN
80.0000 mg/m2 | Freq: Once | INTRAVENOUS | Status: AC
  Administered 2018-02-05: 138 mg via INTRAVENOUS
  Filled 2018-02-05: qty 23

## 2018-02-05 MED ORDER — SODIUM CHLORIDE 0.9 % IV SOLN
Freq: Once | INTRAVENOUS | Status: AC
  Administered 2018-02-05: 14:00:00 via INTRAVENOUS

## 2018-02-05 MED ORDER — HEPARIN SOD (PORK) LOCK FLUSH 100 UNIT/ML IV SOLN
500.0000 [IU] | Freq: Once | INTRAVENOUS | Status: AC | PRN
  Administered 2018-02-05: 500 [IU]
  Filled 2018-02-05: qty 5

## 2018-02-05 MED ORDER — SODIUM CHLORIDE 0.9 % IV SOLN
20.0000 mg | Freq: Once | INTRAVENOUS | Status: AC
  Administered 2018-02-05: 20 mg via INTRAVENOUS
  Filled 2018-02-05: qty 2

## 2018-02-05 MED ORDER — SODIUM CHLORIDE 0.9% FLUSH
10.0000 mL | Freq: Once | INTRAVENOUS | Status: AC
  Administered 2018-02-05: 10 mL
  Filled 2018-02-05: qty 10

## 2018-02-05 NOTE — Progress Notes (Signed)
Pt noticed a small rash on her right hand and wrist, started @ 5 days ago. Pt states it does not itch and has not spred. Pt has not been gardening or touching any plants. Notified Tiffany desk RN who will notify  Dr Lindi Adie, who is in clinic.   Sandi Mealy PA assessed pt in tx area.

## 2018-02-05 NOTE — Patient Instructions (Signed)
[facility] Discharge Instructions for Patients Receiving Chemotherapy  Today you received the following chemotherapy agents: Paclitaxel (Taxol)  To help prevent nausea and vomiting after your treatment, we encourage you to take your nausea medication as prescribed. If you develop nausea and vomiting that is not controlled by your nausea medication, call the clinic.   BELOW ARE SYMPTOMS THAT SHOULD BE REPORTED IMMEDIATELY:  *FEVER GREATER THAN 100.5 F  *CHILLS WITH OR WITHOUT FEVER  NAUSEA AND VOMITING THAT IS NOT CONTROLLED WITH YOUR NAUSEA MEDICATION  *UNUSUAL SHORTNESS OF BREATH  *UNUSUAL BRUISING OR BLEEDING  TENDERNESS IN MOUTH AND THROAT WITH OR WITHOUT PRESENCE OF ULCERS  *URINARY PROBLEMS  *BOWEL PROBLEMS  UNUSUAL RASH Items with * indicate a potential emergency and should be followed up as soon as possible.  Feel free to call the clinic should you have any questions or concerns. The clinic phone number is [phone].  Please show the CHEMO ALERT CARD at check-in to the Emergency Department and triage nurse.   

## 2018-02-12 ENCOUNTER — Inpatient Hospital Stay: Payer: BLUE CROSS/BLUE SHIELD

## 2018-02-12 ENCOUNTER — Ambulatory Visit: Payer: BLUE CROSS/BLUE SHIELD | Admitting: Adult Health

## 2018-02-12 ENCOUNTER — Encounter: Payer: Self-pay | Admitting: *Deleted

## 2018-02-12 ENCOUNTER — Inpatient Hospital Stay (HOSPITAL_BASED_OUTPATIENT_CLINIC_OR_DEPARTMENT_OTHER): Payer: BLUE CROSS/BLUE SHIELD | Admitting: Adult Health

## 2018-02-12 ENCOUNTER — Encounter: Payer: Self-pay | Admitting: Adult Health

## 2018-02-12 ENCOUNTER — Other Ambulatory Visit: Payer: BLUE CROSS/BLUE SHIELD

## 2018-02-12 ENCOUNTER — Ambulatory Visit: Payer: BLUE CROSS/BLUE SHIELD

## 2018-02-12 VITALS — BP 104/73 | HR 89 | Temp 98.3°F | Resp 20 | Ht 66.0 in | Wt 132.7 lb

## 2018-02-12 DIAGNOSIS — G629 Polyneuropathy, unspecified: Secondary | ICD-10-CM

## 2018-02-12 DIAGNOSIS — R5383 Other fatigue: Secondary | ICD-10-CM

## 2018-02-12 DIAGNOSIS — Z923 Personal history of irradiation: Secondary | ICD-10-CM | POA: Diagnosis not present

## 2018-02-12 DIAGNOSIS — Z9221 Personal history of antineoplastic chemotherapy: Secondary | ICD-10-CM

## 2018-02-12 DIAGNOSIS — C50412 Malignant neoplasm of upper-outer quadrant of left female breast: Secondary | ICD-10-CM | POA: Diagnosis not present

## 2018-02-12 DIAGNOSIS — Z5111 Encounter for antineoplastic chemotherapy: Secondary | ICD-10-CM

## 2018-02-12 DIAGNOSIS — Z170 Estrogen receptor positive status [ER+]: Secondary | ICD-10-CM

## 2018-02-12 DIAGNOSIS — D759 Disease of blood and blood-forming organs, unspecified: Secondary | ICD-10-CM

## 2018-02-12 DIAGNOSIS — Z79899 Other long term (current) drug therapy: Secondary | ICD-10-CM | POA: Diagnosis not present

## 2018-02-12 DIAGNOSIS — R200 Anesthesia of skin: Secondary | ICD-10-CM | POA: Diagnosis not present

## 2018-02-12 DIAGNOSIS — Z95828 Presence of other vascular implants and grafts: Secondary | ICD-10-CM

## 2018-02-12 LAB — COMPREHENSIVE METABOLIC PANEL
ALBUMIN: 4 g/dL (ref 3.5–5.0)
ALK PHOS: 59 U/L (ref 40–150)
ALT: 25 U/L (ref 0–55)
AST: 18 U/L (ref 5–34)
Anion gap: 10 (ref 3–11)
BILIRUBIN TOTAL: 0.5 mg/dL (ref 0.2–1.2)
BUN: 12 mg/dL (ref 7–26)
CALCIUM: 9.2 mg/dL (ref 8.4–10.4)
CO2: 24 mmol/L (ref 22–29)
CREATININE: 0.65 mg/dL (ref 0.60–1.10)
Chloride: 106 mmol/L (ref 98–109)
GFR calc Af Amer: >60 mL/min (ref >60)
GFR calc non Af Amer: >60 mL/min (ref >60)
GLUCOSE: 85 mg/dL (ref 70–140)
Potassium: 3.9 mmol/L (ref 3.5–5.1)
Sodium: 140 mmol/L (ref 136–145)
TOTAL PROTEIN: 6.5 g/dL (ref 6.4–8.3)

## 2018-02-12 LAB — CBC WITH DIFFERENTIAL/PLATELET
BASOS ABS: 0 10*3/uL (ref 0.0–0.1)
BASOS PCT: 1 %
EOS PCT: 1 %
Eosinophils Absolute: 0 10*3/uL (ref 0.0–0.5)
HCT: 31.1 % — ABNORMAL LOW (ref 34.8–46.6)
Hemoglobin: 10.5 g/dL — ABNORMAL LOW (ref 11.6–15.9)
LYMPHS PCT: 26 %
Lymphs Abs: 0.7 10*3/uL — ABNORMAL LOW (ref 0.9–3.3)
MCH: 31.7 pg (ref 25.1–34.0)
MCHC: 33.6 g/dL (ref 31.5–36.0)
MCV: 94.1 fL (ref 79.5–101.0)
MONO ABS: 0.2 10*3/uL (ref 0.1–0.9)
Monocytes Relative: 8 %
Neutro Abs: 1.7 10*3/uL (ref 1.5–6.5)
Neutrophils Relative %: 64 %
Platelets: 195 10*3/uL (ref 145–400)
RBC: 3.31 MIL/uL — ABNORMAL LOW (ref 3.70–5.45)
RDW: 15 % — ABNORMAL HIGH (ref 11.2–14.5)
WBC: 2.6 10*3/uL — ABNORMAL LOW (ref 3.9–10.3)

## 2018-02-12 MED ORDER — SODIUM CHLORIDE 0.9% FLUSH
10.0000 mL | Freq: Once | INTRAVENOUS | Status: AC
  Administered 2018-02-12: 10 mL
  Filled 2018-02-12: qty 10

## 2018-02-12 NOTE — Assessment & Plan Note (Signed)
08/29/2017: Screening detected left breast mass 4 cm at 12 o'clock position, 1.4 cm lymph node the axilla, biopsy of the mass and the lymph nodes were positive for IDC with DCIS grade 3 ER 70% PR 0%, KI 2%, HER-2 negative ratio 1.77, T2N1 stage IIIa clinical stage AJCC 8  Mammaprinthigh risk luminal type B. Average 10-year risk of recurrence untreated 29%; with chemotherapy and hormonal therapy distant metastasis free interval at 5 years 94.6%  09/11/2017 breast [id number] breast malignancy with associated linear NME extending posteriorly as well as satellite nodules measures up to 5.3 cm, at least 4 abnormal left axillary lymph nodes  Recommendation: CT scans negative for metastatic disease 1.Neoadjuvant chemotherapy with dose dense Adriamycin and Cytoxan followed by Taxol x12 2. Followed by breast conserving surgery and targeted lymph node dissection 3. Followed by radiation 4. Follow-up antiestrogen therapy -------------------------------------------------------------------- Current treatment: Completed 4 cycles of dose dense Adriamycin and Cytoxan, today is cycle12Taxol neoadjuvantly Echocardiogram 09/24/2017: EF 55-60%  Toxicities of chemotherapy: 1.  Redness of the cheeks 2. loss of voice 3.  Neuropathy in all of her fingertips, starting to affect her motor function  Due to progressive neuropathy, we will not give cycle 12 of Taxol.  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  They are in agreement, and as such, will forego the 12th cycle of Taxol.    She will undergo MRI breast on 4/26 and will f/u with Dr. Lindi Adie on 5/7.  Bary Castilla, RN, breast navigator is aware of the plan.

## 2018-02-12 NOTE — Progress Notes (Signed)
[facility] Follow up:    Tonya Austin, MD [place] [address] [address]   DIAGNOSIS: Cancer Staging Malignant neoplasm of upper-outer quadrant of left breast in female, estrogen receptor positive ([place]) Staging form: Breast, AJCC 8th Edition - Clinical stage from 09/04/2017: Stage IIIA (cT2, cN1, cM0, G3, ER+, PR-, HER2-) - Unsigned Staging comments: Staged at breast conference on 11.14.18   SUMMARY OF ONCOLOGIC HISTORY:   Malignant neoplasm of upper-outer quadrant of left breast in female, estrogen receptor positive ([place])   08/29/2017 Initial Diagnosis    Screening detected left breast mass 4 cm at 12 o'clock position, 1.4 cm lymph node the axilla, biopsy of the mass and the lymph nodes were positive for IDC with DCIS grade 3 ER 70% PR 0%, KI 2%, HER-2 negative ratio 1.77, T2N1 stage IIIa clinical stage AJCC 8      09/11/2017 Breast MRI    Left breast malignancy with associated linear NME extending posteriorly as well as satellite nodules measures up to 5.3 cm, at least 4 abnormal left axillary lymph nodes      09/11/2017 Imaging    CT CAP and Bone scan negative for metastatic disease      09/18/2017 Miscellaneous    Mammaprint high risk luminal type B.  Average 10-year risk of recurrence untreated 29%; with chemotherapy and hormonal therapy distant metastasis free interval at 5 years 94.6%      10/02/2017 -  Neo-Adjuvant Chemotherapy    Neoadjuvant chemotherapy with dose dense Adriamycin and Cytoxan x4 followed by Taxol weekly x12       CURRENT THERAPY: Taxol week 12  INTERVAL HISTORY: Tonya Jackson 52 y.o. female returns for evaluation prior to receiving her 12th week of Taxol chemotherapy.  She is having progressive peripheral neuropathy in her fingertips, and feet.  She says although it is not continuous, it is much more frequent.  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  She does  not notice any balance changes.  She also has a nail dyscrasia, and pain in her nails.   Patient Active Problem List   Diagnosis Date Noted  . Port-A-Cath in place 10/16/2017  . Malignant neoplasm of upper-outer quadrant of left breast in female, estrogen receptor positive (Tonya Jackson) 09/03/2017    is allergic to other.  MEDICAL HISTORY: Past Medical History:  Diagnosis Date  . Abnormal Pap smear 1998  . Anemia 2003  . Cancer ([name]) 08/2017   Left breast cancer  . Dysuria 2011  . H/O cyst of breast 2010   Right  . H/O diarrhea 2009  . H/O dysmenorrhea 2003  . H/O hematuria 2011  . H/O varicella   . H/O vulvovaginitis 2010  . H/O: menorrhagia 2003  . History of measles, mumps, or rubella   . Hx: UTI (urinary tract infection) 2008  . Menses, irregular 2003  . Monilial vaginitis 2003  . Oligomenorrhea 11/05/2011  . Perimenopausal symptoms 11/05/2011  . Vulvar inflammation 2003  . Yeast infection     SURGICAL HISTORY: Past Surgical History:  Procedure Laterality Date  . APPENDECTOMY  1982   An open   . PORTACATH PLACEMENT Right 09/27/2017   Procedure: INSERTION PORT-A-CATH;  Surgeon: Jovita Kussmaul, MD;  Location: [place];  Service: General;  Laterality: Right;  . WISDOM TOOTH EXTRACTION      SOCIAL HISTORY: Social History   Socioeconomic History  . Marital status: Married  Spouse name: Not on file  . Number of children: Not on file  . Years of education: Not on file  . Highest education level: Not on file  Occupational History  . Not on file  Social Needs  . Financial resource strain: Not on file  . Food insecurity:    Worry: Not on file    Inability: Not on file  . Transportation needs:    Medical: Not on file    Non-medical: Not on file  Tobacco Use  . Smoking status: Never Smoker  . Smokeless tobacco: Never Used  Substance and Sexual Activity  . Alcohol use: No  . Drug use: No  . Sexual activity: Yes    Birth control/protection:  Post-menopausal  Lifestyle  . Physical activity:    Days per week: Not on file    Minutes per session: Not on file  . Stress: Not on file  Relationships  . Social connections:    Talks on phone: Not on file    Gets together: Not on file    Attends religious service: Not on file    Active member of club or organization: Not on file    Attends meetings of clubs or organizations: Not on file    Relationship status: Not on file  . Intimate partner violence:    Fear of current or ex partner: Not on file    Emotionally abused: Not on file    Physically abused: Not on file    Forced sexual activity: Not on file  Other Topics Concern  . Not on file  Social History Narrative  . Not on file    FAMILY HISTORY: Family History  Problem Relation Age of Onset  . Diabetes Father   . Hypertension Father   . Hypertension Other     Review of Systems  Constitutional: Negative for appetite change, chills, fatigue, fever and unexpected weight change.  HENT:   Negative for hearing loss, lump/mass and trouble swallowing.   Eyes: Negative for eye problems and icterus.  Respiratory: Negative for chest tightness, cough and shortness of breath.   Cardiovascular: Negative for chest pain, leg swelling and palpitations.  Gastrointestinal: Negative for abdominal distention, abdominal pain, constipation, diarrhea, nausea and vomiting.  Endocrine: Negative for hot flashes.  Skin: Negative for itching and rash.  Neurological: Negative for dizziness, extremity weakness, headaches and numbness.  Hematological: Negative for adenopathy. Does not bruise/bleed easily.  Psychiatric/Behavioral: Negative for depression. The patient is not nervous/anxious.       PHYSICAL EXAMINATION  ECOG PERFORMANCE STATUS: 1 - Symptomatic but completely ambulatory  Vitals:   02/12/18 1125  BP: 104/73  Pulse: 89  Resp: 20  Temp: 98.3 F (36.8 C)  SpO2: 100%    Physical Exam  Constitutional: She is oriented to  person, place, and time and well-developed, well-nourished, and in no distress.  HENT:  Head: Normocephalic and atraumatic.  Mouth/Throat: Oropharynx is clear and moist. No oropharyngeal exudate.  Eyes: Pupils are equal, round, and reactive to light. No scleral icterus.  Neck: Neck supple.  Cardiovascular: Normal rate, regular rhythm and normal heart sounds.  Pulmonary/Chest: Effort normal and breath sounds normal. No respiratory distress. She has no wheezes. She has no rales.  Abdominal: Soft. Bowel sounds are normal. She exhibits no distension and no mass. There is no tenderness. There is no rebound and no guarding.  Lymphadenopathy:    She has no cervical adenopathy.  Neurological: She is alert and oriented to person, place, and time.  Psychiatric: Mood and affect normal.    LABORATORY DATA:  CBC    Component Value Date/Time   WBC 2.6 (L) 02/12/2018 1041   RBC 3.31 (L) 02/12/2018 1041   HGB 10.5 (L) 02/12/2018 1041   HGB 11.5 (L) 10/16/2017 0911   HCT 31.1 (L) 02/12/2018 1041   HCT 35.0 10/16/2017 0911   PLT 195 02/12/2018 1041   PLT 269 10/16/2017 0911   MCV 94.1 02/12/2018 1041   MCV 89.3 10/16/2017 0911   MCH 31.7 02/12/2018 1041   MCHC 33.6 02/12/2018 1041   RDW 15.0 (H) 02/12/2018 1041   RDW 13.3 10/16/2017 0911   LYMPHSABS 0.7 (L) 02/12/2018 1041   LYMPHSABS 1.2 10/16/2017 0911   MONOABS 0.2 02/12/2018 1041   MONOABS 0.6 10/16/2017 0911   EOSABS 0.0 02/12/2018 1041   EOSABS 0.0 10/16/2017 0911   BASOSABS 0.0 02/12/2018 1041   BASOSABS 0.0 10/16/2017 0911    CMP     Component Value Date/Time   NA 140 02/12/2018 1041   NA 140 10/16/2017 0911   K 3.9 02/12/2018 1041   K 3.9 10/16/2017 0911   CL 106 02/12/2018 1041   CO2 24 02/12/2018 1041   CO2 26 10/16/2017 0911   GLUCOSE 85 02/12/2018 1041   GLUCOSE 90 10/16/2017 0911   BUN 12 02/12/2018 1041   BUN 7.3 10/16/2017 0911   CREATININE 0.65 02/12/2018 1041   CREATININE 0.7 10/16/2017 0911   CALCIUM 9.2  02/12/2018 1041   CALCIUM 9.0 10/16/2017 0911   PROT 6.5 02/12/2018 1041   PROT 6.8 10/16/2017 0911   ALBUMIN 4.0 02/12/2018 1041   ALBUMIN 3.9 10/16/2017 0911   AST 18 02/12/2018 1041   AST 14 10/16/2017 0911   ALT 25 02/12/2018 1041   ALT 11 10/16/2017 0911   ALKPHOS 59 02/12/2018 1041   ALKPHOS 60 10/16/2017 0911   BILITOT 0.5 02/12/2018 1041   BILITOT 0.29 10/16/2017 0911   GFRNONAA >60 02/12/2018 1041   GFRAA >60 02/12/2018 1041       ASSESSMENT and PLAN:   Malignant neoplasm of upper-outer quadrant of left breast in female, estrogen receptor positive ([place]) 08/29/2017: Screening detected left breast mass 4 cm at 12 o'clock position, 1.4 cm lymph node the axilla, biopsy of the mass and the lymph nodes were positive for IDC with DCIS grade 3 ER 70% PR 0%, KI 2%, HER-2 negative ratio 1.77, T2N1 stage IIIa clinical stage AJCC 8  Mammaprinthigh risk luminal type B. Average 10-year risk of recurrence untreated 29%; with chemotherapy and hormonal therapy distant metastasis free interval at 5 years 94.6%  09/11/2017 breast [id number] breast malignancy with associated linear NME extending posteriorly as well as satellite nodules measures up to 5.3 cm, at least 4 abnormal left axillary lymph nodes  Recommendation: CT scans negative for metastatic disease 1.Neoadjuvant chemotherapy with dose dense Adriamycin and Cytoxan followed by Taxol x12 2. Followed by breast conserving surgery and targeted lymph node dissection 3. Followed by radiation 4. Follow-up antiestrogen therapy -------------------------------------------------------------------- Current treatment: Completed 4 cycles of dose dense Adriamycin and Cytoxan, today is cycle12Taxol neoadjuvantly Echocardiogram 09/24/2017: EF 55-60%  Toxicities of chemotherapy: 1.  Redness of the cheeks 2. loss of voice 3.  Neuropathy in all of her fingertips, starting to affect her motor function  Due to progressive neuropathy,  we will not give cycle 12 of Taxol.  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  They are in agreement, and as such, will forego the 12th cycle of Taxol.    She will undergo MRI breast on 4/26 and will f/u with Dr. Lindi Adie on 5/7.  Bary Castilla, RN, breast navigator is aware of the plan.        All questions were answered. The patient knows to call the clinic with any problems, questions or concerns. We can certainly see the patient much sooner if necessary.  A total of (20) minutes of face-to-face time was spent with this patient with greater than 50% of that time in counseling and care-coordination.  This note was electronically signed. Scot Dock, NP 02/12/2018

## 2018-02-13 ENCOUNTER — Telehealth: Payer: Self-pay | Admitting: Adult Health

## 2018-02-13 NOTE — Telephone Encounter (Signed)
Per 4/24 no los °

## 2018-02-14 ENCOUNTER — Ambulatory Visit (HOSPITAL_COMMUNITY)
Admission: RE | Admit: 2018-02-14 | Discharge: 2018-02-14 | Disposition: A | Discharge status: Home / Self Care | Payer: BLUE CROSS/BLUE SHIELD | Source: Ambulatory Visit | Attending: Hematology and Oncology | Admitting: Hematology and Oncology

## 2018-02-14 DIAGNOSIS — C50912 Malignant neoplasm of unspecified site of left female breast: Secondary | ICD-10-CM | POA: Diagnosis not present

## 2018-02-14 DIAGNOSIS — C50412 Malignant neoplasm of upper-outer quadrant of left female breast: Secondary | ICD-10-CM | POA: Diagnosis not present

## 2018-02-14 DIAGNOSIS — Z170 Estrogen receptor positive status [ER+]: Secondary | ICD-10-CM | POA: Insufficient documentation

## 2018-02-14 DIAGNOSIS — Z9221 Personal history of antineoplastic chemotherapy: Secondary | ICD-10-CM | POA: Insufficient documentation

## 2018-02-14 MED ORDER — GADOBENATE DIMEGLUMINE 529 MG/ML IV SOLN
15.0000 mL | Freq: Once | INTRAVENOUS | Status: AC | PRN
  Administered 2018-02-14: 12 mL via INTRAVENOUS

## 2018-02-18 ENCOUNTER — Ambulatory Visit: Payer: Self-pay | Admitting: General Surgery

## 2018-02-18 DIAGNOSIS — Z170 Estrogen receptor positive status [ER+]: Secondary | ICD-10-CM | POA: Diagnosis not present

## 2018-02-18 DIAGNOSIS — C50412 Malignant neoplasm of upper-outer quadrant of left female breast: Secondary | ICD-10-CM | POA: Diagnosis not present

## 2018-02-20 ENCOUNTER — Ambulatory Visit: Payer: BLUE CROSS/BLUE SHIELD | Admitting: Hematology and Oncology

## 2018-02-24 ENCOUNTER — Other Ambulatory Visit: Payer: Self-pay | Admitting: *Deleted

## 2018-02-24 DIAGNOSIS — C50412 Malignant neoplasm of upper-outer quadrant of left female breast: Secondary | ICD-10-CM

## 2018-02-24 DIAGNOSIS — Z170 Estrogen receptor positive status [ER+]: Principal | ICD-10-CM

## 2018-02-25 ENCOUNTER — Inpatient Hospital Stay: Payer: BLUE CROSS/BLUE SHIELD | Attending: Hematology and Oncology | Admitting: Hematology and Oncology

## 2018-02-25 ENCOUNTER — Encounter: Payer: Self-pay | Admitting: Radiation Oncology

## 2018-02-25 ENCOUNTER — Telehealth: Payer: Self-pay | Admitting: Hematology and Oncology

## 2018-02-25 DIAGNOSIS — Z9221 Personal history of antineoplastic chemotherapy: Secondary | ICD-10-CM | POA: Diagnosis not present

## 2018-02-25 DIAGNOSIS — C50412 Malignant neoplasm of upper-outer quadrant of left female breast: Secondary | ICD-10-CM

## 2018-02-25 DIAGNOSIS — Z923 Personal history of irradiation: Secondary | ICD-10-CM | POA: Insufficient documentation

## 2018-02-25 DIAGNOSIS — Z170 Estrogen receptor positive status [ER+]: Secondary | ICD-10-CM | POA: Diagnosis not present

## 2018-02-25 NOTE — Telephone Encounter (Signed)
Gave patient AVs and calendar of upcoming May appointments.  °

## 2018-02-25 NOTE — Progress Notes (Signed)
Patient Care Team: [name], MD as PCP - General (Family Medicine)  DIAGNOSIS:  Encounter Diagnosis  Name Primary?  . Malignant neoplasm of upper-outer quadrant of left breast in female, estrogen receptor positive ([place])     SUMMARY OF ONCOLOGIC HISTORY:   Malignant neoplasm of upper-outer quadrant of left breast in female, estrogen receptor positive ([place])   08/29/2017 Initial Diagnosis    Screening detected left breast mass 4 cm at 12 o'clock position, 1.4 cm lymph node the axilla, biopsy of the mass and the lymph nodes were positive for IDC with DCIS grade 3 ER 70% PR 0%, KI 2%, HER-2 negative ratio 1.77, T2N1 stage IIIa clinical stage AJCC 8      09/11/2017 Breast MRI    Left breast malignancy with associated linear NME extending posteriorly as well as satellite nodules measures up to 5.3 cm, at least 4 abnormal left axillary lymph nodes      09/11/2017 Imaging    CT CAP and Bone scan negative for metastatic disease      09/18/2017 Miscellaneous    Mammaprint high risk luminal type B.  Average 10-year risk of recurrence untreated 29%; with chemotherapy and hormonal therapy distant metastasis free interval at 5 years 94.6%      10/02/2017 - 02/12/2018 Neo-Adjuvant Chemotherapy    Neoadjuvant chemotherapy with dose dense Adriamycin and Cytoxan x4 followed by Taxol weekly x12      02/14/2018 Breast MRI    Near complete imaging response of the known cancer in the left breast neoadjuvant chemotherapy. There is a 2 mm residual focus of enhancement approximately 1.2 cm inferior and medial to the biopsy marking clip.        CHIEF COMPLIANT: Follow-up to discuss the breast MRI  INTERVAL HISTORY: OCEANNA ARRUDA is a 52 year old with above-mentioned history of left breast cancer stage IIIa who underwent neoadjuvant chemotherapy and is here today to discuss the results of breast MRI report.  She had near complete response to neoadjuvant chemotherapy.  There is a small focus of  residual enhancement.  She was able to tolerate chemotherapy except for neuropathy which prevented [place] from giving the last cycle of treatment.  REVIEW OF SYSTEMS:   Constitutional: Denies fevers, chills or abnormal weight loss Eyes: Denies blurriness of vision Ears, nose, mouth, throat, and face: Denies mucositis or sore throat Respiratory: Denies cough, dyspnea or wheezes Cardiovascular: Denies palpitation, chest discomfort Gastrointestinal:  Denies nausea, heartburn or change in bowel habits Skin: Denies abnormal skin rashes Lymphatics: Denies new lymphadenopathy or easy bruising Neurological: Peripheral neuropathy grade 1-2 Behavioral/Psych: Mood is stable, no new changes  Extremities: No lower extremity edema All other systems were reviewed with the patient and are negative.  I have reviewed the past medical history, past surgical history, social history and family history with the patient and they are unchanged from previous note.  ALLERGIES:  is allergic to other.  MEDICATIONS:  No current outpatient medications on file.   No current facility-administered medications for this visit.     PHYSICAL EXAMINATION: ECOG PERFORMANCE STATUS: 1 - Symptomatic but completely ambulatory  Vitals:   02/25/18 0827  BP: 116/74  Pulse: 89  Resp: 18  Temp: 98.5 F (36.9 C)  SpO2: 100%   Filed Weights   02/25/18 0827  Weight: 132 lb 12.8 oz (60.2 kg)    GENERAL:alert, no distress and comfortable SKIN: skin color, texture, turgor are normal, no rashes or significant lesions EYES: normal, Conjunctiva are pink and non-injected, sclera clear  OROPHARYNX:no exudate, no erythema and lips, buccal mucosa, and tongue normal  NECK: supple, thyroid normal size, non-tender, without nodularity LYMPH:  no palpable lymphadenopathy in the cervical, axillary or inguinal LUNGS: clear to auscultation and percussion with normal breathing effort HEART: regular rate & rhythm and no murmurs and no lower  extremity edema ABDOMEN:abdomen soft, non-tender and normal bowel sounds MUSCULOSKELETAL:no cyanosis of digits and no clubbing  NEURO: alert & oriented x 3 with fluent speech, peripheral neuropathy EXTREMITIES: No lower extremity edema  LABORATORY DATA:  I have reviewed the data as listed CMP Latest Ref Rng & Units 02/12/2018 02/05/2018 01/29/2018  Glucose 70 - 140 mg/dL 85 78 88  BUN 7 - 26 mg/dL '12 7 8  '$ Creatinine 0.60 - 1.10 mg/dL 0.65 0.63 0.65  Sodium 136 - 145 mmol/L 140 139 141  Potassium 3.5 - 5.1 mmol/L 3.9 3.9 4.0  Chloride 98 - 109 mmol/L 106 105 104  CO2 22 - 29 mmol/L 24 30(H) 29  Calcium 8.4 - 10.4 mg/dL 9.2 9.3 9.3  Total Protein 6.4 - 8.3 g/dL 6.5 6.7 6.5  Total Bilirubin 0.2 - 1.2 mg/dL 0.5 0.5 0.4  Alkaline Phos 40 - 150 U/L 59 67 66  AST 5 - 34 U/L '18 21 31  '$ ALT 0 - 55 U/L 25 31 68(H)    Lab Results  Component Value Date   WBC 2.6 (L) 02/12/2018   HGB 10.5 (L) 02/12/2018   HCT 31.1 (L) 02/12/2018   MCV 94.1 02/12/2018   PLT 195 02/12/2018   NEUTROABS 1.7 02/12/2018    ASSESSMENT & PLAN:  Malignant neoplasm of upper-outer quadrant of left breast in female, estrogen receptor positive ([place]) 08/29/2017: Screening detected left breast mass 4 cm at 12 o'clock position, 1.4 cm lymph node the axilla, biopsy of the mass and the lymph nodes were positive for IDC with DCIS grade 3 ER 70% PR 0%, KI 2%, HER-2 negative ratio 1.77, T2N1 stage IIIa clinical stage AJCC 8  Mammaprinthigh risk luminal type B. Average 10-year risk of recurrence untreated 29%; with chemotherapy and hormonal therapy distant metastasis free interval at 5 years 94.6%  09/11/2017 breast OAC:ZYSA breast malignancy with associated linear NME extending posteriorly as well as satellite nodules measures up to 5.3 cm, at least 4 abnormal left axillary lymph nodes  Recommendation: CT scans negative for metastatic disease 1.Neoadjuvant chemotherapy with dose dense Adriamycin and Cytoxan followed by  Taxol x12completed 02/12/2018 2. Followed by breast conserving surgery and targeted lymph node dissection 3. Followed by radiation 4. Follow-up antiestrogen therapy --------------------------------------------------------------------  Breast MRI 02/14/2018 near complete response to neoadjuvant chemotherapy.  2 mm residual focus.  Radiology review: We are very excited to hear the results from the MRI.   I provided the patient with a copy of this report. Surgery with lumpectomy and targeted node dissection is happening on 03/13/2018 I will see her back on 03/20/2018 to discuss the final pathology report. Patient understands that if there is residual disease or positive margins and she may have to do a mastectomy.  Return to clinic after surgery to discuss pathology report   No orders of the defined types were placed in this encounter.  The patient has a good understanding of the overall plan. she agrees with it. she will call with any problems that may develop before the next visit here.   Harriette Ohara, MD 02/25/18

## 2018-02-25 NOTE — Assessment & Plan Note (Signed)
08/29/2017: Screening detected left breast mass 4 cm at 12 o'clock position, 1.4 cm lymph node the axilla, biopsy of the mass and the lymph nodes were positive for IDC with DCIS grade 3 ER 70% PR 0%, KI 2%, HER-2 negative ratio 1.77, T2N1 stage IIIa clinical stage AJCC 8  Mammaprinthigh risk luminal type B. Average 10-year risk of recurrence untreated 29%; with chemotherapy and hormonal therapy distant metastasis free interval at 5 years 94.6%  09/11/2017 breast AST:MHDQ breast malignancy with associated linear NME extending posteriorly as well as satellite nodules measures up to 5.3 cm, at least 4 abnormal left axillary lymph nodes  Recommendation: CT scans negative for metastatic disease 1.Neoadjuvant chemotherapy with dose dense Adriamycin and Cytoxan followed by Taxol x12completed 02/12/2018 2. Followed by breast conserving surgery and targeted lymph node dissection 3. Followed by radiation 4. Follow-up antiestrogen therapy --------------------------------------------------------------------  Breast MRI 02/14/2018 near complete response to neoadjuvant chemotherapy.  2 mm residual focus.  Radiology review: We are very excited to hear the results from the MRI.   I provided the patient with a copy of this report.  Return to clinic after surgery to discuss pathology report

## 2018-03-07 ENCOUNTER — Encounter (HOSPITAL_BASED_OUTPATIENT_CLINIC_OR_DEPARTMENT_OTHER): Payer: Self-pay | Admitting: *Deleted

## 2018-03-07 ENCOUNTER — Other Ambulatory Visit: Payer: Self-pay

## 2018-03-07 NOTE — Pre-Procedure Instructions (Signed)
To come pick up Ensure pre-surgery drink 10 oz. - to drink by 0830 DOS; and CHG soap to use in shower night before surgery and AM DOS.

## 2018-03-12 ENCOUNTER — Encounter: Payer: Self-pay | Admitting: Hematology and Oncology

## 2018-03-12 DIAGNOSIS — N6322 Unspecified lump in the left breast, upper inner quadrant: Secondary | ICD-10-CM | POA: Diagnosis not present

## 2018-03-12 DIAGNOSIS — C50812 Malignant neoplasm of overlapping sites of left female breast: Secondary | ICD-10-CM | POA: Diagnosis not present

## 2018-03-12 DIAGNOSIS — C773 Secondary and unspecified malignant neoplasm of axilla and upper limb lymph nodes: Secondary | ICD-10-CM | POA: Diagnosis not present

## 2018-03-12 DIAGNOSIS — N6321 Unspecified lump in the left breast, upper outer quadrant: Secondary | ICD-10-CM | POA: Diagnosis not present

## 2018-03-12 NOTE — Progress Notes (Signed)
Ensure pre surgery drink given with instructions to complete by 0830 dos, surgical soap given with instructions, pt verbalized understanding. 

## 2018-03-13 ENCOUNTER — Encounter (HOSPITAL_BASED_OUTPATIENT_CLINIC_OR_DEPARTMENT_OTHER): Payer: Self-pay | Admitting: Anesthesiology

## 2018-03-13 ENCOUNTER — Ambulatory Visit (HOSPITAL_BASED_OUTPATIENT_CLINIC_OR_DEPARTMENT_OTHER): Payer: BLUE CROSS/BLUE SHIELD | Admitting: Anesthesiology

## 2018-03-13 ENCOUNTER — Encounter (HOSPITAL_BASED_OUTPATIENT_CLINIC_OR_DEPARTMENT_OTHER)
Admission: RE | Disposition: A | Discharge status: Home / Self Care | Payer: Self-pay | Source: Ambulatory Visit | Attending: General Surgery

## 2018-03-13 ENCOUNTER — Ambulatory Visit (HOSPITAL_COMMUNITY)
Admission: RE | Admit: 2018-03-13 | Discharge: 2018-03-13 | Disposition: A | Discharge status: Home / Self Care | Payer: BLUE CROSS/BLUE SHIELD | Source: Ambulatory Visit | Attending: General Surgery | Admitting: General Surgery

## 2018-03-13 ENCOUNTER — Ambulatory Visit (HOSPITAL_BASED_OUTPATIENT_CLINIC_OR_DEPARTMENT_OTHER)
Admission: RE | Admit: 2018-03-13 | Discharge: 2018-03-13 | Disposition: A | Discharge status: Home / Self Care | Payer: BLUE CROSS/BLUE SHIELD | Source: Ambulatory Visit | Attending: General Surgery | Admitting: General Surgery

## 2018-03-13 DIAGNOSIS — Z79899 Other long term (current) drug therapy: Secondary | ICD-10-CM | POA: Insufficient documentation

## 2018-03-13 DIAGNOSIS — Z170 Estrogen receptor positive status [ER+]: Secondary | ICD-10-CM | POA: Diagnosis not present

## 2018-03-13 DIAGNOSIS — C773 Secondary and unspecified malignant neoplasm of axilla and upper limb lymph nodes: Secondary | ICD-10-CM | POA: Insufficient documentation

## 2018-03-13 DIAGNOSIS — N6012 Diffuse cystic mastopathy of left breast: Secondary | ICD-10-CM | POA: Diagnosis not present

## 2018-03-13 DIAGNOSIS — Z9221 Personal history of antineoplastic chemotherapy: Secondary | ICD-10-CM | POA: Diagnosis not present

## 2018-03-13 DIAGNOSIS — C50412 Malignant neoplasm of upper-outer quadrant of left female breast: Secondary | ICD-10-CM

## 2018-03-13 DIAGNOSIS — Z79890 Hormone replacement therapy (postmenopausal): Secondary | ICD-10-CM | POA: Insufficient documentation

## 2018-03-13 DIAGNOSIS — C50912 Malignant neoplasm of unspecified site of left female breast: Secondary | ICD-10-CM | POA: Diagnosis not present

## 2018-03-13 DIAGNOSIS — G8918 Other acute postprocedural pain: Secondary | ICD-10-CM | POA: Diagnosis not present

## 2018-03-13 DIAGNOSIS — Z452 Encounter for adjustment and management of vascular access device: Secondary | ICD-10-CM | POA: Diagnosis not present

## 2018-03-13 DIAGNOSIS — N6322 Unspecified lump in the left breast, upper inner quadrant: Secondary | ICD-10-CM | POA: Diagnosis not present

## 2018-03-13 DIAGNOSIS — K219 Gastro-esophageal reflux disease without esophagitis: Secondary | ICD-10-CM | POA: Diagnosis not present

## 2018-03-13 DIAGNOSIS — N6321 Unspecified lump in the left breast, upper outer quadrant: Secondary | ICD-10-CM | POA: Diagnosis not present

## 2018-03-13 HISTORY — DX: Gastro-esophageal reflux disease without esophagitis: K21.9

## 2018-03-13 HISTORY — DX: Presence of dental prosthetic device (complete) (partial): Z97.2

## 2018-03-13 HISTORY — PX: BREAST LUMPECTOMY WITH RADIOACTIVE SEED AND SENTINEL LYMPH NODE BIOPSY: SHX6550

## 2018-03-13 HISTORY — PX: PORT-A-CATH REMOVAL: SHX5289

## 2018-03-13 HISTORY — DX: Personal history of malignant neoplasm of breast: Z85.3

## 2018-03-13 HISTORY — DX: Personal history of antineoplastic chemotherapy: Z92.21

## 2018-03-13 SURGERY — BREAST LUMPECTOMY WITH RADIOACTIVE SEED AND SENTINEL LYMPH NODE BIOPSY
Anesthesia: General | Site: Chest | Laterality: Right

## 2018-03-13 MED ORDER — FENTANYL CITRATE (PF) 100 MCG/2ML IJ SOLN
50.0000 ug | INTRAMUSCULAR | Status: DC | PRN
  Administered 2018-03-13: 50 ug via INTRAVENOUS

## 2018-03-13 MED ORDER — CELECOXIB 200 MG PO CAPS
ORAL_CAPSULE | ORAL | Status: AC
  Filled 2018-03-13: qty 1

## 2018-03-13 MED ORDER — FENTANYL CITRATE (PF) 100 MCG/2ML IJ SOLN
INTRAMUSCULAR | Status: AC
Start: 2018-03-13 — End: 2018-03-13
  Filled 2018-03-13: qty 2

## 2018-03-13 MED ORDER — HYDROCODONE-ACETAMINOPHEN 7.5-325 MG PO TABS: 1.0000 | ORAL_TABLET | Freq: Once | ORAL | Status: DC | PRN

## 2018-03-13 MED ORDER — CEFAZOLIN SODIUM-DEXTROSE 2-4 GM/100ML-% IV SOLN
INTRAVENOUS | Status: AC
  Filled 2018-03-13: qty 100

## 2018-03-13 MED ORDER — LIDOCAINE 2% (20 MG/ML) 5 ML SYRINGE
INTRAMUSCULAR | Status: DC | PRN
  Administered 2018-03-13: 50 mg via INTRAVENOUS

## 2018-03-13 MED ORDER — MIDAZOLAM HCL 2 MG/2ML IJ SOLN
1.0000 mg | INTRAMUSCULAR | Status: DC | PRN
  Administered 2018-03-13: 2 mg via INTRAVENOUS

## 2018-03-13 MED ORDER — FENTANYL CITRATE (PF) 100 MCG/2ML IJ SOLN: 25.0000 ug | INTRAMUSCULAR | Status: DC | PRN

## 2018-03-13 MED ORDER — LACTATED RINGERS IV SOLN
INTRAVENOUS | Status: DC
  Administered 2018-03-13: 11:00:00 via INTRAVENOUS

## 2018-03-13 MED ORDER — ACETAMINOPHEN 500 MG PO TABS
ORAL_TABLET | ORAL | Status: AC
  Filled 2018-03-13: qty 2

## 2018-03-13 MED ORDER — DEXAMETHASONE SODIUM PHOSPHATE 10 MG/ML IJ SOLN
INTRAMUSCULAR | Status: AC
  Filled 2018-03-13: qty 1

## 2018-03-13 MED ORDER — ONDANSETRON HCL 4 MG/2ML IJ SOLN
INTRAMUSCULAR | Status: DC | PRN
  Administered 2018-03-13: 4 mg via INTRAVENOUS

## 2018-03-13 MED ORDER — CELECOXIB 200 MG PO CAPS
200.0000 mg | ORAL_CAPSULE | ORAL | Status: AC
  Administered 2018-03-13: 200 mg via ORAL

## 2018-03-13 MED ORDER — CEFAZOLIN SODIUM-DEXTROSE 2-4 GM/100ML-% IV SOLN
2.0000 g | INTRAVENOUS | Status: AC
  Administered 2018-03-13: 2 g via INTRAVENOUS

## 2018-03-13 MED ORDER — METOCLOPRAMIDE HCL 5 MG/ML IJ SOLN: 10.0000 mg | Freq: Once | INTRAMUSCULAR | Status: DC | PRN

## 2018-03-13 MED ORDER — ONDANSETRON HCL 4 MG/2ML IJ SOLN
INTRAMUSCULAR | Status: AC
  Filled 2018-03-13: qty 2

## 2018-03-13 MED ORDER — SCOPOLAMINE 1 MG/3DAYS TD PT72: 1.0000 | MEDICATED_PATCH | Freq: Once | TRANSDERMAL | Status: DC | PRN

## 2018-03-13 MED ORDER — GABAPENTIN 300 MG PO CAPS
300.0000 mg | ORAL_CAPSULE | ORAL | Status: AC
  Administered 2018-03-13: 300 mg via ORAL

## 2018-03-13 MED ORDER — MIDAZOLAM HCL 2 MG/2ML IJ SOLN
INTRAMUSCULAR | Status: AC
  Filled 2018-03-13: qty 2

## 2018-03-13 MED ORDER — DEXAMETHASONE SODIUM PHOSPHATE 4 MG/ML IJ SOLN
INTRAMUSCULAR | Status: DC | PRN
  Administered 2018-03-13: 10 mg via INTRAVENOUS

## 2018-03-13 MED ORDER — CHLORHEXIDINE GLUCONATE CLOTH 2 % EX PADS: 6.0000 | MEDICATED_PAD | Freq: Once | CUTANEOUS | Status: DC

## 2018-03-13 MED ORDER — HYDROCODONE-ACETAMINOPHEN 5-325 MG PO TABS: 1.0000 | ORAL_TABLET | Freq: Four times a day (QID) | ORAL | 0 refills | Status: DC | PRN

## 2018-03-13 MED ORDER — PROPOFOL 10 MG/ML IV BOLUS
INTRAVENOUS | Status: DC | PRN
  Administered 2018-03-13: 170 mg via INTRAVENOUS

## 2018-03-13 MED ORDER — TECHNETIUM TC 99M SULFUR COLLOID FILTERED
1.0000 | Freq: Once | INTRAVENOUS | Status: AC | PRN
  Administered 2018-03-13: 1 via INTRADERMAL

## 2018-03-13 MED ORDER — MEPERIDINE HCL 25 MG/ML IJ SOLN: 6.2500 mg | INTRAMUSCULAR | Status: DC | PRN

## 2018-03-13 MED ORDER — LIDOCAINE HCL (CARDIAC) PF 100 MG/5ML IV SOSY
PREFILLED_SYRINGE | INTRAVENOUS | Status: AC
Start: 2018-03-13 — End: ?
  Filled 2018-03-13: qty 5

## 2018-03-13 MED ORDER — BUPIVACAINE-EPINEPHRINE 0.25% -1:200000 IJ SOLN
INTRAMUSCULAR | Status: DC | PRN
  Administered 2018-03-13: 30 mL

## 2018-03-13 MED ORDER — PHENYLEPHRINE 40 MCG/ML (10ML) SYRINGE FOR IV PUSH (FOR BLOOD PRESSURE SUPPORT)
PREFILLED_SYRINGE | INTRAVENOUS | Status: AC
  Filled 2018-03-13: qty 10

## 2018-03-13 MED ORDER — SODIUM CHLORIDE 0.9 % IJ SOLN
INTRAVENOUS | Status: DC | PRN
  Administered 2018-03-13: 5 mL via INTRAMUSCULAR

## 2018-03-13 MED ORDER — ACETAMINOPHEN 500 MG PO TABS
1000.0000 mg | ORAL_TABLET | ORAL | Status: AC
  Administered 2018-03-13: 1000 mg via ORAL

## 2018-03-13 MED ORDER — GABAPENTIN 300 MG PO CAPS
ORAL_CAPSULE | ORAL | Status: AC
  Filled 2018-03-13: qty 1

## 2018-03-13 SURGICAL SUPPLY — 52 items
APPLIER CLIP 9.375 MED OPEN (MISCELLANEOUS) ×6
BINDER BREAST LRG (GAUZE/BANDAGES/DRESSINGS) ×3 IMPLANT
BLADE SURG 15 STRL LF DISP TIS (BLADE) ×2 IMPLANT
BLADE SURG 15 STRL SS (BLADE) ×1
CANISTER SUC SOCK COL 7IN (MISCELLANEOUS) IMPLANT
CANISTER SUCT 1200ML W/VALVE (MISCELLANEOUS) ×3 IMPLANT
CHLORAPREP W/TINT 26ML (MISCELLANEOUS) ×3 IMPLANT
CLIP APPLIE 9.375 MED OPEN (MISCELLANEOUS) ×4 IMPLANT
COVER BACK TABLE 60X90IN (DRAPES) ×3 IMPLANT
COVER MAYO STAND STRL (DRAPES) ×3 IMPLANT
COVER PROBE W GEL 5X96 (DRAPES) ×3 IMPLANT
DECANTER SPIKE VIAL GLASS SM (MISCELLANEOUS) IMPLANT
DERMABOND ADVANCED (GAUZE/BANDAGES/DRESSINGS) ×1
DERMABOND ADVANCED .7 DNX12 (GAUZE/BANDAGES/DRESSINGS) ×2 IMPLANT
DEVICE DUBIN W/COMP PLATE 8390 (MISCELLANEOUS) ×3 IMPLANT
DRAPE LAPAROSCOPIC ABDOMINAL (DRAPES) ×3 IMPLANT
DRAPE LAPAROTOMY 100X72 PEDS (DRAPES) IMPLANT
DRAPE UTILITY XL STRL (DRAPES) ×3 IMPLANT
ELECT COATED BLADE 2.86 ST (ELECTRODE) ×3 IMPLANT
ELECT REM PT RETURN 9FT ADLT (ELECTROSURGICAL) ×3
ELECTRODE REM PT RTRN 9FT ADLT (ELECTROSURGICAL) ×2 IMPLANT
GLOVE BIO SURGEON STRL SZ 6.5 (GLOVE) ×3 IMPLANT
GLOVE BIO SURGEON STRL SZ7 (GLOVE) ×3 IMPLANT
GLOVE BIO SURGEON STRL SZ7.5 (GLOVE) ×6 IMPLANT
GLOVE BIOGEL PI IND STRL 6.5 (GLOVE) ×2 IMPLANT
GLOVE BIOGEL PI INDICATOR 6.5 (GLOVE) ×1
GLOVE EXAM NITRILE MD LF STRL (GLOVE) ×3 IMPLANT
GOWN STRL REUS W/ TWL LRG LVL3 (GOWN DISPOSABLE) ×4 IMPLANT
GOWN STRL REUS W/ TWL XL LVL3 (GOWN DISPOSABLE) ×2 IMPLANT
GOWN STRL REUS W/TWL LRG LVL3 (GOWN DISPOSABLE) ×2
GOWN STRL REUS W/TWL XL LVL3 (GOWN DISPOSABLE) ×1
ILLUMINATOR WAVEGUIDE N/F (MISCELLANEOUS) IMPLANT
KIT MARKER MARGIN INK (KITS) ×3 IMPLANT
LIGHT WAVEGUIDE WIDE FLAT (MISCELLANEOUS) IMPLANT
NDL SAFETY ECLIPSE 18X1.5 (NEEDLE) IMPLANT
NEEDLE HYPO 18GX1.5 SHARP (NEEDLE)
NEEDLE HYPO 25X1 1.5 SAFETY (NEEDLE) ×6 IMPLANT
NS IRRIG 1000ML POUR BTL (IV SOLUTION) ×3 IMPLANT
PACK BASIN DAY SURGERY FS (CUSTOM PROCEDURE TRAY) ×3 IMPLANT
PENCIL BUTTON HOLSTER BLD 10FT (ELECTRODE) ×3 IMPLANT
SLEEVE SCD COMPRESS KNEE MED (MISCELLANEOUS) ×3 IMPLANT
SPONGE LAP 18X18 RF (DISPOSABLE) ×3 IMPLANT
SUT MON AB 4-0 PC3 18 (SUTURE) ×6 IMPLANT
SUT SILK 2 0 SH (SUTURE) IMPLANT
SUT VIC AB 3-0 SH 27 (SUTURE)
SUT VIC AB 3-0 SH 27X BRD (SUTURE) IMPLANT
SUT VICRYL 3-0 CR8 SH (SUTURE) ×3 IMPLANT
SYR CONTROL 10ML LL (SYRINGE) ×3 IMPLANT
TOWEL OR 17X24 6PK STRL BLUE (TOWEL DISPOSABLE) ×3 IMPLANT
TOWEL OR NON WOVEN STRL DISP B (DISPOSABLE) IMPLANT
TUBE CONNECTING 20X1/4 (TUBING) ×3 IMPLANT
YANKAUER SUCT BULB TIP NO VENT (SUCTIONS) ×3 IMPLANT

## 2018-03-13 NOTE — Progress Notes (Signed)
Assisted Dr. Royce Macadamia with left, ultrasound guided, pectoralis block. Side rails up, monitors on throughout procedure. See vital signs in flow sheet. Tolerated Procedure well.

## 2018-03-13 NOTE — Anesthesia Preprocedure Evaluation (Signed)
Anesthesia Evaluation  Patient identified by MRN, date of birth, ID band Patient awake    Reviewed: Allergy & Precautions, NPO status , Patient's Chart, lab work & pertinent test results  Airway Mallampati: I  TM Distance: >3 FB Neck ROM: Full    Dental  (+) Caps Upper and lower bridges:   Pulmonary neg pulmonary ROS,    Pulmonary exam normal breath sounds clear to auscultation       Cardiovascular Normal cardiovascular exam Rhythm:Regular Rate:Normal     Neuro/Psych negative neurological ROS  negative psych ROS   GI/Hepatic Neg liver ROS, GERD  Medicated and Controlled,  Endo/Other  Left Breast Ca  Renal/GU negative Renal ROS  negative genitourinary   Musculoskeletal negative musculoskeletal ROS (+)   Abdominal   Peds  Hematology  (+) anemia , Leukopenia   Anesthesia Other Findings   Reproductive/Obstetrics                             Anesthesia Physical Anesthesia Plan  ASA: II  Anesthesia Plan: General   Post-op Pain Management:  Regional for Post-op pain   Induction: Intravenous  PONV Risk Score and Plan: 4 or greater and Ondansetron, Dexamethasone, Midazolam and Treatment may vary due to age or medical condition  Airway Management Planned: LMA  Additional Equipment:   Intra-op Plan:   Post-operative Plan: Extubation in OR  Informed Consent: I have reviewed the patients History and Physical, chart, labs and discussed the procedure including the risks, benefits and alternatives for the proposed anesthesia with the patient or authorized representative who has indicated his/her understanding and acceptance.   Dental advisory given  Plan Discussed with: CRNA, Anesthesiologist and Surgeon  Anesthesia Plan Comments:         Anesthesia Quick Evaluation

## 2018-03-13 NOTE — Discharge Instructions (Signed)

## 2018-03-13 NOTE — Op Note (Addendum)
03/13/2018  3:07 PM  PATIENT:  Tonya Jackson  52 y.o. female  PRE-OPERATIVE DIAGNOSIS:  LEFT BREAST CANCER  POST-OPERATIVE DIAGNOSIS:  LEFT BREAST CANCER  PROCEDURE:  Procedure(s): LEFT BREAST LUMPECTOMY WITH RADIOACTIVE SEED AND DEEP LEFT RADIOACTIVE SEED TARGETED AXILLARY LYMPH NODE EXCISION AND LEFT DEEP SENTINEL LYMPH NODE BIOPSY (Left) REMOVAL PORT-A-CATH (Right)  SURGEON:  Surgeon(s) and Role:    * Jovita Kussmaul, MD - Primary  PHYSICIAN ASSISTANT:   ASSISTANTS: none   ANESTHESIA:   local and general  EBL:  5 mL   BLOOD ADMINISTERED:none  DRAINS: none   LOCAL MEDICATIONS USED:  MARCAINE     SPECIMEN:  Source of Specimen:  left breast tissue with additional margins and sentinel nodes  DISPOSITION OF SPECIMEN:  PATHOLOGY  COUNTS:  YES  TOURNIQUET:  * No tourniquets in log *  DICTATION: .Dragon Dictation   After informed consent was obtained the patient was brought to the operating room and placed in the supine position on the operating table.  After adequate induction of general anesthesia the patient's bilateral chest and left breast and axillary area were prepped with ChloraPrep, allowed to dry, and draped in usual sterile manner.  An appropriate timeout was performed.  Attention was first turned to the right chest wall.  The area around the port was infiltrated with quarter percent Marcaine.  A small incision was made in the skin through her previous incision.  The incision was carried through the subcutaneous tissue sharply with a 15 blade knife until the capsule surrounding the port was opened.  The 2 anchoring stitches were divided and removed.  The port was then pushed out of its pocket and with gentle traction was removed from the patient without difficulty.  Pressure was held for several minutes until the area was completely hemostatic.  The deep layer of the wound was then closed with interrupted 3-0 Vicryl stitches.  The skin was then closed with a running  4-0 Monocryl subcuticular stitch.  Attention was then turned to the left breast and axilla.  Previously a I-125 radioactive seed was placed in the upper outer portion of the left breast centrally to mark an area of invasive breast cancer.  Also an I-125 seed was placed in the left axilla to mark the previously biopsied lymph node.  Earlier in the day the patient underwent injection 1 mCi technetium sulfur colloid in the subareolar position on the left.  At this point I also infiltrated 2 cc of methylene blue and 3 cc of injectable saline in the subareolar position and the breast was massaged for several minutes.  The neoprobe was initially sent to I-125 in the area of radioactivity was readily identified in the left axilla.  A small transversely oriented incision was made with a 15 blade knife near the area of radioactivity.  The incision was carried through the skin and subcutaneous tissue sharply with electrocautery until the deep left axillary space was entered.  The neoprobe was used to direct blunt hemostat dissection until a lymph node next to the radioactive seed was identified.  This was excised sharply with the electrocautery and the lymphatics were controlled with clips.  There may have been a couple lymph nodes in this specimen.  The neoprobe was set to then technetium and there was also some technetium radioactivity in a different portion of the specimen.  This was sent as sentinel node and targeted node #1.  I was then able to identify 4 other palpable lymph  nodes that also had some slight radioactivity to them.  These were also excised sharply with electrocautery and the lymphatics were controlled with clips.  A specimen radiograph confirmed the seed to be in the nodal tissue.  At this point no other hot or palpable lymph nodes were identified.  The area was examined and found to be hemostatic.  The deep layer of the wound was then closed with interrupted 3-0 Vicryl stitches.  The skin was then closed  with a running 4-0 Monocryl subcuticular stitch.  Attention was then turned to the left breast.  The area of the radioactive seed was readily identified centrally in the upper outer left breast.  This area was infiltrated with quarter percent Marcaine.  A curvilinear incision was made along the upper and outer aspect of the edge of the areola of the left breast.  The incision was carried through the skin and subcutaneous tissue sharply with the electrocautery.  The upper outer quadrant breast tissue was then mobilized away from the skin and subcutaneous tissue sharply with electrocautery.  I was then able to excise the area around the radioactive seed and sort of a crescent-shaped lumpectomy.  This dissection was carried all the way to the chest wall.  Once the specimen was removed it was oriented with the appropriate paint colors.  A specimen radiograph was obtained that showed the clip and seed to be near the center of the specimen.  The specimen was then sent to pathology for further evaluation.  I then excised an additional superior margin, inferior margin, medial margin, and lateral margin.  These were marked appropriately and sent to pathology for further evaluation.  Hemostasis was achieved using the Bovie electrocautery.  The wound was irrigated with saline and infiltrated with more quarter percent Marcaine.  The cavity was marked with clips.  The deep layer of the wound was then closed with interrupted 3-0 Vicryl stitches.  The skin was then closed with interrupted 4-0 Monocryl subcuticular stitches.  Dermabond dressings were applied.  The patient tolerated the procedure well.  At the end of the case all needle sponge and instrument counts were correct.  The patient was then awakened and taken to recovery in stable condition.  PLAN OF CARE: Discharge to home after PACU  PATIENT DISPOSITION:  PACU - hemodynamically stable.   Delay start of Pharmacological VTE agent (>24hrs) due to surgical blood loss  or risk of bleeding: not applicable

## 2018-03-13 NOTE — Transfer of Care (Signed)
Immediate Anesthesia Transfer of Care Note  Patient: Tonya Jackson  Procedure(s) Performed: LEFT BREAST LUMPECTOMY WITH RADIOACTIVE SEED AND LEFT RADIOACTIVE SEED TARGETED AXILLARY LYMPH NODE EXCISION AND LEFT SENTINEL LYMPH NODE BIOPSY (Left Breast) REMOVAL PORT-A-CATH (Right Chest)  Patient Location: PACU  Anesthesia Type:General  Level of Consciousness: sedated  Airway & Oxygen Therapy: Patient Spontanous Breathing and Patient connected to face mask oxygen  Post-op Assessment: Report given to RN and Post -op Vital signs reviewed and stable  Post vital signs: Reviewed and stable  Last Vitals:  Vitals Value Taken Time  BP 115/74 03/13/2018  3:15 PM  Temp    Pulse 82 03/13/2018  3:17 PM  Resp 12 03/13/2018  3:17 PM  SpO2 100 % 03/13/2018  3:17 PM  Vitals shown include unvalidated device data.  Last Pain:  Vitals:   03/13/18 1102  TempSrc: Oral  PainSc: 0-No pain      Patients Stated Pain Goal: 3 (12/14/34 1224)  Complications: No apparent anesthesia complications

## 2018-03-13 NOTE — Anesthesia Postprocedure Evaluation (Signed)
Anesthesia Post Note  Patient: Tonya Jackson  Procedure(s) Performed: LEFT BREAST LUMPECTOMY WITH RADIOACTIVE SEED AND LEFT RADIOACTIVE SEED TARGETED AXILLARY LYMPH NODE EXCISION AND LEFT SENTINEL LYMPH NODE BIOPSY (Left Breast) REMOVAL PORT-A-CATH (Right Chest)     Patient location during evaluation: PACU Anesthesia Type: General Level of consciousness: awake and alert and oriented Pain management: pain level controlled Vital Signs Assessment: post-procedure vital signs reviewed and stable Respiratory status: spontaneous breathing, nonlabored ventilation and respiratory function stable Cardiovascular status: blood pressure returned to baseline and stable Postop Assessment: no apparent nausea or vomiting Anesthetic complications: no    Last Vitals:  Vitals:   03/13/18 1200 03/13/18 1515  BP: 118/71 115/74  Pulse: (!) 102 85  Resp: 17 16  Temp:  37.1 C  SpO2: 100% 100%    Last Pain:  Vitals:   03/13/18 1102  TempSrc: Oral  PainSc: 0-No pain                 Kamaiya Antilla A.

## 2018-03-13 NOTE — H&P (Signed)
Tonya Jackson  Location: [facility] Patient #: [id number] DOB: October 31, 1965 Married / Language: English / Race: Undefined Female   History of Present Illness The patient is a 52 year old female who presents for a follow-up for Breast cancer. The patient is a 52 year old white female who initially presented with a large 5+ centimeter cancer in the upper portion of the left breast with 4 abnormal lymph nodes one of which was biopsied and was positive. Her cancer was a grade 3 cancer that was ER positive and PR negative and HER-2 negative with a Ki-67 of 2%. Her mammoprint was high risk. She received neoadjuvant chemotherapy and tolerated it reasonably well. Her last dose was about 2 weeks ago. She is now ready to schedule definitive surgery. Her recent MRI study showed only 2 mm of enhancement in the upper portion of the left breast and all the lymph nodes now looked normal.   Allergies  No Known Drug Allergies   Medication History Paragard Intrauterine Copper (Intrauterine) Active. Vitamin D3 (2000UNIT Tablet, Oral) Active. Synthroid (50MCG Tablet, Oral) Active. Progesterone ('200MG'$  Capsule, Oral) Active. Medications Reconciled    Review of Systems  General Not Present- Appetite Loss, Chills, Fatigue, Fever, Night Sweats, Weight Gain and Weight Loss. Skin Not Present- Change in Wart/Mole, Dryness, Hives, Jaundice, New Lesions, Non-Healing Wounds, Rash and Ulcer. HEENT Present- Seasonal Allergies and Wears glasses/contact lenses. Not Present- Earache, Hearing Loss, Hoarseness, Nose Bleed, Oral Ulcers, Ringing in the Ears, Sinus Pain, Sore Throat, Visual Disturbances and Yellow Eyes. Respiratory Not Present- Bloody sputum, Chronic Cough, Difficulty Breathing, Snoring and Wheezing. Breast Present- Breast Mass and Breast Pain. Not Present- Nipple Discharge and Skin Changes. Cardiovascular Not Present- Chest Pain, Difficulty Breathing Lying Down, Leg Cramps,  Palpitations, Rapid Heart Rate, Shortness of Breath and Swelling of Extremities. Gastrointestinal Present- Bloating, Chronic diarrhea, Constipation and Excessive gas. Not Present- Abdominal Pain, Bloody Stool, Change in Bowel Habits, Difficulty Swallowing, Gets full quickly at meals, Hemorrhoids, Indigestion, Nausea, Rectal Pain and Vomiting. Female Genitourinary Not Present- Frequency, Nocturia, Painful Urination, Pelvic Pain and Urgency. Musculoskeletal Not Present- Back Pain, Joint Pain, Joint Stiffness, Muscle Pain, Muscle Weakness and Swelling of Extremities. Neurological Present- Headaches. Not Present- Decreased Memory, Fainting, Numbness, Seizures, Tingling, Tremor, Trouble walking and Weakness. Psychiatric Present- Frequent crying. Not Present- Anxiety, Bipolar, Change in Sleep Pattern, Depression and Fearful. Endocrine Not Present- Cold Intolerance, Excessive Hunger, Hair Changes, Heat Intolerance, Hot flashes and New Diabetes. Hematology Present- Easy Bruising. Not Present- Blood Thinners, Excessive bleeding, Gland problems, HIV and Persistent Infections.  Vitals  Weight: 132.25 lb Height: 64in Body Surface Area: 1.64 m Body Mass Index: 22.7 kg/m  Temp.: 97.36F(Oral)  Pulse: 115 (Regular)  BP: 108/74 (Sitting, Left Arm, Standard)       Physical Exam  General Mental Status-Alert. General Appearance-Consistent with stated age. Hydration-Well hydrated. Voice-Normal.  Head and Neck Head-normocephalic, atraumatic with no lesions or palpable masses. Trachea-midline. Thyroid Gland Characteristics - normal size and consistency.  Eye Eyeball - Bilateral-Extraocular movements intact. Sclera/Conjunctiva - Bilateral-No scleral icterus.  Chest and Lung Exam Chest and lung exam reveals -quiet, even and easy respiratory effort with no use of accessory muscles and on auscultation, normal breath sounds, no adventitious sounds and normal vocal  resonance. Inspection Chest Wall - Normal. Back - normal.  Breast Note: There is no palpable mass in either breast. There is no palpable axillary, supraclavicular, or cervical lymphadenopathy   Cardiovascular Cardiovascular examination reveals -normal heart sounds, regular rate and rhythm with no  murmurs and normal pedal pulses bilaterally.  Abdomen Inspection Inspection of the abdomen reveals - No Hernias. Skin - Scar - no surgical scars. Palpation/Percussion Palpation and Percussion of the abdomen reveal - Soft, Non Tender, No Rebound tenderness, No Rigidity (guarding) and No hepatosplenomegaly. Auscultation Auscultation of the abdomen reveals - Bowel sounds normal.  Neurologic Neurologic evaluation reveals -alert and oriented x 3 with no impairment of recent or remote memory. Mental Status-Normal.  Musculoskeletal Normal Exam - Left-Upper Extremity Strength Normal and Lower Extremity Strength Normal. Normal Exam - Right-Upper Extremity Strength Normal and Lower Extremity Strength Normal.  Lymphatic Head & Neck  General Head & Neck Lymphatics: Bilateral - Description - Normal. Axillary  General Axillary Region: Bilateral - Description - Normal. Tenderness - Non Tender. Femoral & Inguinal  Generalized Femoral & Inguinal Lymphatics: Bilateral - Description - Normal. Tenderness - Non Tender.    Assessment & Plan MALIGNANT NEOPLASM OF UPPER-OUTER QUADRANT OF LEFT BREAST IN FEMALE, ESTROGEN RECEPTOR POSITIVE (C50.412) Impression: The patient had a locally advanced left breast cancer with positive lymph nodes. She has had a great response to neoadjuvant chemotherapy with only 2 mm of enhancement in the left breast and all normal-looking lymph nodes. I have talked to her in detail about the options for treatment and at this point she favors breast conservation and targeted node dissection. She understands that there could be a chance of needing more surgery based on  the pathology. I have discussed with her in detail the risks and benefits of the surgery as well as some of the technical aspects and she understands and wishes to proceed. We will also wait for the oncologist to tell us if we can remove the port at the time of surgery.

## 2018-03-13 NOTE — Interval H&P Note (Signed)
History and Physical Interval Note:  03/13/2018 12:48 PM  Tonya Jackson  has presented today for surgery, with the diagnosis of LEFT BREAST CANCER  The various methods of treatment have been discussed with the patient and family. After consideration of risks, benefits and other options for treatment, the patient has consented to  Procedure(s): LEFT BREAST LUMPECTOMY WITH RADIOACTIVE SEED AND LEFT RADIOACTIVE SEED TARGETED AXILLARY LYMPH NODE EXCISION AND LEFT SENTINEL LYMPH NODE BIOPSY (Left) REMOVAL PORT-A-CATH (N/A) as a surgical intervention .  The patient's history has been reviewed, patient examined, no change in status, stable for surgery.  I have reviewed the patient's chart and labs.  Questions were answered to the patient's satisfaction.     TOTH III,Shaianne Nucci S

## 2018-03-13 NOTE — Anesthesia Procedure Notes (Signed)
Procedure Name: LMA Insertion Performed by: Lillar Bianca W, CRNA Pre-anesthesia Checklist: Patient identified, Emergency Drugs available, Suction available and Patient being monitored Patient Re-evaluated:Patient Re-evaluated prior to induction Oxygen Delivery Method: Circle system utilized Preoxygenation: Pre-oxygenation with 100% oxygen Induction Type: IV induction Ventilation: Mask ventilation without difficulty LMA: LMA inserted LMA Size: 4.0 Number of attempts: 1 Placement Confirmation: positive ETCO2 Tube secured with: Tape Dental Injury: Teeth and Oropharynx as per pre-operative assessment        

## 2018-03-13 NOTE — Anesthesia Procedure Notes (Signed)
Anesthesia Regional Block: Pectoralis block   Pre-Anesthetic Checklist: ,, timeout performed, Correct Patient, Correct Site, Correct Laterality, Correct Procedure, Correct Position, site marked, Risks and benefits discussed,  Surgical consent,  Pre-op evaluation,  At surgeon's request and post-op pain management  Laterality: Left  Prep: chloraprep       Needles:  Injection technique: Single-shot  Needle Type: Echogenic Stimulator Needle     Needle Length: 9cm  Needle Gauge: 21   Needle insertion depth: 6 cm   Additional Needles:   Procedures:,,,, ultrasound used (permanent image in chart),,,,  Narrative:  Start time: 03/13/2018 11:37 AM End time: 03/13/2018 11:42 AM Injection made incrementally with aspirations every 5 mL.  Performed by: Personally  Anesthesiologist: Josephine Igo, MD  Additional Notes: Timeout performed. Patient sedated. Relevant anatomy ID'd using [place]. Incremental 2-5ml injection of LA with frequent aspiration. Patient tolerated procedure well.        Left Pectoralis Block

## 2018-03-14 ENCOUNTER — Encounter (HOSPITAL_BASED_OUTPATIENT_CLINIC_OR_DEPARTMENT_OTHER): Payer: Self-pay | Admitting: General Surgery

## 2018-03-14 NOTE — Addendum Note (Signed)
Addendum  created 03/14/18 0762 by Librado Guandique, Ernesta Amble, CRNA   Charge Capture section accepted

## 2018-03-20 ENCOUNTER — Inpatient Hospital Stay (HOSPITAL_BASED_OUTPATIENT_CLINIC_OR_DEPARTMENT_OTHER): Payer: BLUE CROSS/BLUE SHIELD | Admitting: Hematology and Oncology

## 2018-03-20 DIAGNOSIS — Z170 Estrogen receptor positive status [ER+]: Secondary | ICD-10-CM | POA: Diagnosis not present

## 2018-03-20 DIAGNOSIS — Z923 Personal history of irradiation: Secondary | ICD-10-CM | POA: Diagnosis not present

## 2018-03-20 DIAGNOSIS — Z9221 Personal history of antineoplastic chemotherapy: Secondary | ICD-10-CM | POA: Diagnosis not present

## 2018-03-20 DIAGNOSIS — C50412 Malignant neoplasm of upper-outer quadrant of left female breast: Secondary | ICD-10-CM

## 2018-03-20 NOTE — Progress Notes (Signed)
Patient Care Team: [name], MD as PCP - General (Family Medicine)  DIAGNOSIS:  Encounter Diagnosis  Name Primary?  . Malignant neoplasm of upper-outer quadrant of left breast in female, estrogen receptor positive ([place])     SUMMARY OF ONCOLOGIC HISTORY:   Malignant neoplasm of upper-outer quadrant of left breast in female, estrogen receptor positive ([place])   08/29/2017 Initial Diagnosis    Screening detected left breast mass 4 cm at 12 o'clock position, 1.4 cm lymph node the axilla, biopsy of the mass and the lymph nodes were positive for IDC with DCIS grade 3 ER 70% PR 0%, KI 2%, HER-2 negative ratio 1.77, T2N1 stage IIIa clinical stage AJCC 8      09/11/2017 Breast MRI    Left breast malignancy with associated linear NME extending posteriorly as well as satellite nodules measures up to 5.3 cm, at least 4 abnormal left axillary lymph nodes      09/11/2017 Imaging    CT CAP and Bone scan negative for metastatic disease      09/18/2017 Miscellaneous    Mammaprint high risk luminal type B.  Average 10-year risk of recurrence untreated 29%; with chemotherapy and hormonal therapy distant metastasis free interval at 5 years 94.6%      10/02/2017 - 02/12/2018 Neo-Adjuvant Chemotherapy    Neoadjuvant chemotherapy with dose dense Adriamycin and Cytoxan x4 followed by Taxol weekly x12      02/14/2018 Breast MRI    Near complete imaging response of the known cancer in the left breast neoadjuvant chemotherapy. There is a 2 mm residual focus of enhancement approximately 1.2 cm inferior and medial to the biopsy marking clip.       03/13/2018 Surgery    Left lumpectomy: IDC with DCIS grade 3, 0.6 cm, 0/6 lymph nodes negative, margins negative, ER 70%, PR 0%, HER-2 negative ratio 1.77, RCB class II ypT1b, ypN0       CHIEF COMPLIANT: Follow-up after recent left lumpectomy and targeted node dissection  INTERVAL HISTORY: Tonya Jackson is a 52 year old with above-mentioned history of  left breast cancer who had lymph node involvement and was treated with neoadjuvant chemotherapy.  She had a fantastic response to treatment.  She underwent recent lumpectomy and targeted node dissection and is here today to discuss the results.  She is recovering very well from recent surgery.    REVIEW OF SYSTEMS:   Constitutional: Denies fevers, chills or abnormal weight loss Eyes: Denies blurriness of vision Ears, nose, mouth, throat, and face: Denies mucositis or sore throat Respiratory: Denies cough, dyspnea or wheezes Cardiovascular: Denies palpitation, chest discomfort Gastrointestinal:  Denies nausea, heartburn or change in bowel habits Skin: Denies abnormal skin rashes Lymphatics: Denies new lymphadenopathy or easy bruising Neurological:Denies numbness, tingling or new weaknesses Behavioral/Psych: Mood is stable, no new changes  Extremities: No lower extremity edema Breast: Recent lumpectomy with targeted node dissection All other systems were reviewed with the patient and are negative.  I have reviewed the past medical history, past surgical history, social history and family history with the patient and they are unchanged from previous note.  ALLERGIES:  is allergic to adhesive [tape].  MEDICATIONS:  Current Outpatient Medications  Medication Sig Dispense Refill  . HYDROcodone-acetaminophen (NORCO/VICODIN) 5-325 MG tablet Take 1-2 tablets by mouth every 6 (six) hours as needed for moderate pain or severe pain. 15 tablet 0   No current facility-administered medications for this visit.     PHYSICAL EXAMINATION: ECOG PERFORMANCE STATUS: 1 - Symptomatic but completely  ambulatory  Vitals:   03/20/18 1127  BP: 121/84  Pulse: 80  Resp: 17  Temp: 98.2 F (36.8 C)  SpO2: 100%   Filed Weights   03/20/18 1127  Weight: 132 lb 1.6 oz (59.9 kg)    GENERAL:alert, no distress and comfortable SKIN: skin color, texture, turgor are normal, no rashes or significant  lesions EYES: normal, Conjunctiva are pink and non-injected, sclera clear OROPHARYNX:no exudate, no erythema and lips, buccal mucosa, and tongue normal  NECK: supple, thyroid normal size, non-tender, without nodularity LYMPH:  no palpable lymphadenopathy in the cervical, axillary or inguinal LUNGS: clear to auscultation and percussion with normal breathing effort HEART: regular rate & rhythm and no murmurs and no lower extremity edema ABDOMEN:abdomen soft, non-tender and normal bowel sounds MUSCULOSKELETAL:no cyanosis of digits and no clubbing  NEURO: alert & oriented x 3 with fluent speech, no focal motor/sensory deficits EXTREMITIES: No lower extremity edema  LABORATORY DATA:  I have reviewed the data as listed CMP Latest Ref Rng & Units 02/12/2018 02/05/2018 01/29/2018  Glucose 70 - 140 mg/dL 85 78 88  BUN 7 - 26 mg/dL '12 7 8  '$ Creatinine 0.60 - 1.10 mg/dL 0.65 0.63 0.65  Sodium 136 - 145 mmol/L 140 139 141  Potassium 3.5 - 5.1 mmol/L 3.9 3.9 4.0  Chloride 98 - 109 mmol/L 106 105 104  CO2 22 - 29 mmol/L 24 30(H) 29  Calcium 8.4 - 10.4 mg/dL 9.2 9.3 9.3  Total Protein 6.4 - 8.3 g/dL 6.5 6.7 6.5  Total Bilirubin 0.2 - 1.2 mg/dL 0.5 0.5 0.4  Alkaline Phos 40 - 150 U/L 59 67 66  AST 5 - 34 U/L '18 21 31  '$ ALT 0 - 55 U/L 25 31 68(H)    Lab Results  Component Value Date   WBC 2.6 (L) 02/12/2018   HGB 10.5 (L) 02/12/2018   HCT 31.1 (L) 02/12/2018   MCV 94.1 02/12/2018   PLT 195 02/12/2018   NEUTROABS 1.7 02/12/2018    ASSESSMENT & PLAN:  Malignant neoplasm of upper-outer quadrant of left breast in female, estrogen receptor positive ([place]) 08/29/2017: Screening detected left breast mass 4 cm at 12 o'clock position, 1.4 cm lymph node the axilla, biopsy of the mass and the lymph nodes were positive for IDC with DCIS grade 3 ER 70% PR 0%, KI 2%, HER-2 negative ratio 1.77, T2N1 stage IIIa clinical stage AJCC 8  Mammaprinthigh risk luminal type B.   03/13/2018: Left lumpectomy: IDC with  DCIS grade 3, 0.6 cm, 0/6 lymph nodes negative, margins negative, ER 70%, PR 0%, HER-2 negative ratio 1.77, RCB class II ypT1b, ypN0  Treatment summary: 1.Neoadjuvant chemotherapy with dose dense Adriamycin and Cytoxan followed by Taxol x12completed 02/12/2018 2. Followed by breast conserving surgery and targeted lymph node dissection on 03/13/2018 3. Followed by radiation 4. Follow-up antiestrogen therapy -------------------------------------------------------------------- Pathology counseling: I discussed the final pathology report of the patient provided  a copy of this report. I discussed the margins as well as lymph node surgeries. We also discussed the final staging along with previously performed ER/PR and HER-2/neu testing.  I recommended that the patient undergo adjuvant radiation therapy followed by antiestrogen therapy.  I will see the patient towards the end of radiation.     No orders of the defined types were placed in this encounter.  The patient has a good understanding of the overall plan. she agrees with it. she will call with any problems that may develop before the next visit here.  Harriette Ohara, MD 03/20/18

## 2018-03-20 NOTE — Assessment & Plan Note (Signed)
08/29/2017: Screening detected left breast mass 4 cm at 12 o'clock position, 1.4 cm lymph node the axilla, biopsy of the mass and the lymph nodes were positive for IDC with DCIS grade 3 ER 70% PR 0%, KI 2%, HER-2 negative ratio 1.77, T2N1 stage IIIa clinical stage AJCC 8  Mammaprinthigh risk luminal type B.   03/13/2018: Left lumpectomy: IDC with DCIS grade 3, 0.6 cm, 0/6 lymph nodes negative, margins negative, ER 70%, PR 0%, HER-2 negative ratio 1.77, RCB class II ypT1b, ypN0  Treatment summary: 1.Neoadjuvant chemotherapy with dose dense Adriamycin and Cytoxan followed by Taxol x12completed 02/12/2018 2. Followed by breast conserving surgery and targeted lymph node dissection on 03/13/2018 3. Followed by radiation 4. Follow-up antiestrogen therapy -------------------------------------------------------------------- Pathology counseling: I discussed the final pathology report of the patient provided  a copy of this report. I discussed the margins as well as lymph node surgeries. We also discussed the final staging along with previously performed ER/PR and HER-2/neu testing.  I recommended that the patient undergo adjuvant radiation therapy followed by antiestrogen therapy.  I will see the patient towards the end of radiation.

## 2018-03-20 NOTE — Progress Notes (Signed)
Location of Breast Cancer: Left Breast  Histology per Pathology Report:  09/08/17 Diagnosis 1. Breast, left, needle core biopsy - INVASIVE AND IN SITU MAMMARY CARCINOMA. - SEE COMMENT. 2. Lymph node, needle/core biopsy, axilla - INVASIVE MAMMARY CARCINOMA.  Receptor Status: ER(70%), PR (NEG), Her2-neu (NEG), Ki-(2%)  03/13/18 Diagnosis 1. Lymph node, sentinel, biopsy, Left Axillary positive #1 w/ seed - TWO LYMPH NODES WITH NO METASTATIC CARCINOMA IDENTIFIED (0/2). 2. Lymph node, sentinel, biopsy, Left Axillary #2 - ONE LYMPH NODE WITH NO METASTATIC CARCINOMA IDENTIFIED. (0/1). 3. Lymph node, sentinel, biopsy, Left Axillary #3 - ONE LYMPH NODE WITH NO METASTATIC CARCINOMA IDENTIFIED. (0/1). 4. Lymph node, sentinel, biopsy, Left Axillary #4 - ONE LYMPH NODE WITH NO METASTATIC CARCINOMA IDENTIFIED (0/1). 5. Lymph node, sentinel, biopsy, Left Axillary #5 - ONE LYMPH NODE WITH NO METASTATIC CARCINOMA IDENTIFIED. (0/1). 6. Breast, lumpectomy, Left w/seed - INVASIVE AND IN SITU DUCTAL CARCINOMA, MSBR GRADE III. - MARGINS NOT INVOLVED. - BIOPSY SITE AND BIOPSY CLIP. 7. Breast, excision, Left Superior Margin - FIBROCYSTIC CHANGES. - FINAL SUPERIOR MARGIN CLEAR. 8. Breast, excision, Left Inferior Margin - FIBROCYSTIC CHANGES WITH FOCAL TREATMENT EFFECT. - FINAL INFERIOR MARGIN CLEAR. 9. Breast, excision, Left Lateral Margin - FIBROCYSTIC CHANGES. - FINAL LATERAL MARGIN CLEAR. 10. Breast, excision, Left Medial Margin - FIBROCYSTIC CHANGES. - FINAL MEDIAL MARGIN CLEAR.  Did patient present with symptoms or was this found on screening mammography?: It was found on a screening mammogram.   Past/Anticipated interventions by surgeon, if any: 03/13/18 PROCEDURE:  Procedure(s): LEFT BREAST LUMPECTOMY WITH RADIOACTIVE SEED AND DEEP LEFT RADIOACTIVE SEED TARGETED AXILLARY LYMPH NODE EXCISION AND LEFT DEEP SENTINEL LYMPH NODE BIOPSY (Left) REMOVAL PORT-A-CATH (Right) SURGEON:  Surgeon(s)  and Role:    Jovita Kussmaul, MD - Primary  She saw Dr. Marlou Starks yesterday and he aspirated a seroma at her surgery site. She will see Dr. Marlou Starks again next week for check up.   Past/Anticipated interventions by medical oncology, if any:  03/20/18 Dr. Lindi Adie: Treatment summary: 1.Neoadjuvant chemotherapy with dose dense Adriamycin and Cytoxan followed by Taxol x12completed 02/12/2018 2. Followed by breast conserving surgery and targeted lymph node dissection on 03/13/2018 3. Followed by radiation 4. Follow-up antiestrogen therapy -------------------------------------------------------------------- Pathology counseling: I discussed the final pathology report of the patient provided  a copy of this report. I discussed the margins as well as lymph node surgeries. We also discussed the final staging along with previously performed ER/PR and HER-2/neu testing. -I recommended that the patient undergo adjuvant radiation therapy followed by antiestrogen therapy. - I will see the patient towards the end of radiation.   Lymphedema issues, if any:  She denies.   Pain issues, if any:  She has pain to her upper left arm from surgery. She rates it a 5/10, and took Tylenol with some relief.   SAFETY ISSUES:  Prior radiation? No  Pacemaker/ICD? No  Possible current pregnancy? No, she is post menopausal.   Is the patient on methotrexate? No  Current Complaints / other details:    BP 108/77   Pulse 88   Temp 98.3 F (36.8 C)   Ht _0  (1.676 m)   Wt 133 lb 3.2 oz (60.4 kg)   LMP 09/17/2014   SpO2 100% Comment: room air  BMI 21.50 kg/m      Wt Readings from Last 3 Encounters:  03/28/18 133 lb 3.2 oz (60.4 kg)  03/20/18 132 lb 1.6 oz (59.9 kg)  03/13/18 132 lb 2 oz (59.9 kg)  Zorah Backes, Stephani Police, RN 03/20/2018,3:15 PM

## 2018-03-28 ENCOUNTER — Other Ambulatory Visit: Payer: Self-pay

## 2018-03-28 ENCOUNTER — Ambulatory Visit
Admission: RE | Admit: 2018-03-28 | Discharge: 2018-03-28 | Disposition: A | Discharge status: Home / Self Care | Payer: BLUE CROSS/BLUE SHIELD | Source: Ambulatory Visit | Attending: Radiation Oncology | Admitting: Radiation Oncology

## 2018-03-28 ENCOUNTER — Encounter: Payer: Self-pay | Admitting: Radiation Oncology

## 2018-03-28 VITALS — BP 108/77 | HR 88 | Temp 98.3°F | Ht 66.0 in | Wt 133.2 lb

## 2018-03-28 DIAGNOSIS — Z79899 Other long term (current) drug therapy: Secondary | ICD-10-CM | POA: Diagnosis not present

## 2018-03-28 DIAGNOSIS — Z170 Estrogen receptor positive status [ER+]: Secondary | ICD-10-CM | POA: Diagnosis not present

## 2018-03-28 DIAGNOSIS — C50412 Malignant neoplasm of upper-outer quadrant of left female breast: Secondary | ICD-10-CM

## 2018-03-28 DIAGNOSIS — K219 Gastro-esophageal reflux disease without esophagitis: Secondary | ICD-10-CM | POA: Diagnosis not present

## 2018-03-28 DIAGNOSIS — Z9889 Other specified postprocedural states: Secondary | ICD-10-CM | POA: Diagnosis not present

## 2018-03-28 DIAGNOSIS — Z9221 Personal history of antineoplastic chemotherapy: Secondary | ICD-10-CM | POA: Insufficient documentation

## 2018-03-28 NOTE — Progress Notes (Signed)
Radiation Oncology         [phone] ________________________________  Outpatient Re-Consultation Note  Name: Tonya Jackson MRN: [id number]  Date: 03/28/2018  DOB: 1965-11-28  QH:UTMLY, Butch Penny, MD  Nicholas Lose, MD   REFERRING PHYSICIAN: Nicholas Lose, MD  DIAGNOSIS:    ICD-10-CM   1. Malignant neoplasm of upper-outer quadrant of left breast in female, estrogen receptor positive ([place]) C50.412    Z17.0   Cancer Staging Malignant neoplasm of upper-outer quadrant of left breast in female, estrogen receptor positive ([place]) Staging form: Breast, AJCC 8th Edition - Clinical stage from 09/04/2017: Stage IIIA (cT2, cN1, cM0, G3, ER+, PR-, HER2-) - Unsigned Staging comments: Staged at breast conference on 11.14.18  ypT1b, ypN0  CHIEF COMPLAINT: Here to discuss management of left breast cancer  HISTORY OF PRESENT ILLNESS::Tonya Jackson is a 52 y.o. female who presented to the multidisciplinary clinic om 09/04/2017.  Biopsy showed invasive ductal carcinoma with DCIS, ER 70%, PR negative, Her2 negative, grade 3. The lymph node was biopsied and found to be positive. In physical exam on 09/04/2018, there was a mobile area of thickening in the 12:00 region of the right breast which was about 2cm, MRI was recommended for this. It showed Biopsy proven malignancy in the upper left breast with associated linear non mass enhancement extending posteriorly as well as surrounding satellite nodules measures up to 5.3 cm. At least 4 abnormal left axillary lymph nodes, 1 of which was previously biopsy demostrating metastatic disease. No MRI evidence of malignancy in the right breast.  Since our last visit, patient underwent a bone scan on 09/17/18 which was normal. CT scan of chest on 09/17/17 showed a small cavitary nodule in the right lower lobe favored to be benign, no evidence of breast cancer metastasis to the lymph nodes of the thorax. CT of abdomen/ Pelvis showed no evidence of metastsis diseases  in the abdomen pelvis and no skeletal metastasis.  An MRI breast on 02/14/18 which demonstrated a 51m residual focus enhancement approximately 1.2 cm inferior and medial to the biopsy marking clip, there was no evidence of malignancy in the right breast. I have personally reviewed the imaging. Mammaprint showed high risk luminal type B. Patient received Neoadjuvant chemotherapy with dose dense Adriamycin and Cytoxan followed by Taxol x12 which was completed 02/12/2018. Patient also underwent left lumpectomy and targetyed node dissection on 03/13/2018 her pathology was discussed  at tumor board post operatively 5 negative sentinal nodes and an additional node was also negative. All margins are negative by at least 9 mm, Tumor was 6 mm in size. It also revealed IDC with DCIS grade 3, 0.3cm, 0/6 lymph nodes negative, with negative margins, ER 70%, PR 0%, KI 2%, Her2 negative ratio 1.77, RCB class II ypT1b, ypN0. She saw Dr. TMarlou Starksyesterday and he aspirated a seroma at her axillary surgery site and will see him again for a check up.  Today, the patient is here to discuss adjuvant radiation therapy. She has pain to her upper left arm from surgery. She rates it a 5 out 10 and takes Tylenol with some relief. She denies any lymphedema issues.  PREVIOUS RADIATION THERAPY: No  PAST MEDICAL HISTORY:  has a past medical history of Dental bridge present, GERD (gastroesophageal reflux disease), History of chemotherapy, and History of left breast cancer (2018).    PAST SURGICAL HISTORY: Past Surgical History:  Procedure Laterality Date  . APPENDECTOMY  1982  . BREAST LUMPECTOMY WITH RADIOACTIVE SEED AND SENTINEL LYMPH NODE  BIOPSY Left 03/13/2018   Procedure: LEFT BREAST LUMPECTOMY WITH RADIOACTIVE SEED AND LEFT RADIOACTIVE SEED TARGETED AXILLARY LYMPH NODE EXCISION AND LEFT SENTINEL LYMPH NODE BIOPSY;  Surgeon: Jovita Kussmaul, MD;  Location: [facility];  Service: General;  Laterality: Left;  . PORT-A-CATH  REMOVAL Right 03/13/2018   Procedure: REMOVAL PORT-A-CATH;  Surgeon: Jovita Kussmaul, MD;  Location: [facility];  Service: General;  Laterality: Right;  . PORTACATH PLACEMENT Right 09/27/2017   Procedure: INSERTION PORT-A-CATH;  Surgeon: Jovita Kussmaul, MD;  Location: [facility];  Service: General;  Laterality: Right;  . WISDOM TOOTH EXTRACTION      FAMILY HISTORY: family history includes Diabetes in her father; Hypertension in her father and other.  SOCIAL HISTORY:  reports that she has never smoked. She has never used smokeless tobacco. She reports that she does not drink alcohol or use drugs.  ALLERGIES: Adhesive [tape]  MEDICATIONS:  Current Outpatient Medications  Medication Sig Dispense Refill  . acetaminophen (TYLENOL) 325 MG tablet Take 650 mg by mouth every 6 (six) hours as needed.    Marland Kitchen HYDROcodone-acetaminophen (NORCO/VICODIN) 5-325 MG tablet Take 1-2 tablets by mouth every 6 (six) hours as needed for moderate pain or severe pain. (Patient not taking: Reported on 03/28/2018) 15 tablet 0   No current facility-administered medications for this encounter.     REVIEW OF SYSTEMS: A 10+ POINT REVIEW OF SYSTEMS WAS OBTAINED including neurology, dermatology, psychiatry, cardiac, respiratory, lymph, extremities, GI, GU, Musculoskeletal, constitutional, breasts, reproductive, HEENT.  All pertinent positives are noted in the HPI.  All others are negative.   PHYSICAL EXAM:  height is _0  (1.676 m) and weight is 133 lb 3.2 oz (60.4 kg). Her temperature is 98.3 F (36.8 C). Her blood pressure is 108/77 and her pulse is 88. Her oxygen saturation is 100%.   General: Alert and oriented, in no acute distress Heart: Regular in rate and rhythm with no murmurs, rubs, or gallops. Chest: Clear to auscultation bilaterally, with no rhonchi, wheezes, or rales. Extremities: No cyanosis or edema. Skin: No concerning lesions. Psychiatric: Judgment and insight are intact. Affect  is appropriate. Vascular: Porta cath has been removed from her right upper chest. Breasts: Right breast and axilla has no masses. She has post operative seroma in th upper outer quadrant of the left breast that is several centimeters in dimension, no significant swelling in the axilla; her scars are healing well.  ECOG = 0  0 - Asymptomatic (Fully active, able to carry on all predisease activities without restriction)  1 - Symptomatic but completely ambulatory (Restricted in physically strenuous activity but ambulatory and able to carry out work of a light or sedentary nature. For example, light housework, office work)  2 - Symptomatic, <50% in bed during the day (Ambulatory and capable of all self care but unable to carry out any work activities. Up and about more than 50% of waking hours)  3 - Symptomatic, >50% in bed, but not bedbound (Capable of only limited self-care, confined to bed or chair 50% or more of waking hours)  4 - Bedbound (Completely disabled. Cannot carry on any self-care. Totally confined to bed or chair)  5 - Death   Eustace Pen MM, Creech RH, Tormey DC, et al. [phone]). "Toxicity and response criteria of the [facility]". [place] Oncol. 5 (6): 649-55   LABORATORY DATA:  Lab Results  Component Value Date   WBC 2.6 (L) 02/12/2018  HGB 10.5 (L) 02/12/2018   HCT 31.1 (L) 02/12/2018   MCV 94.1 02/12/2018   PLT 195 02/12/2018   CMP     Component Value Date/Time   NA 140 02/12/2018 1041   NA 140 10/16/2017 0911   K 3.9 02/12/2018 1041   K 3.9 10/16/2017 0911   CL 106 02/12/2018 1041   CO2 24 02/12/2018 1041   CO2 26 10/16/2017 0911   GLUCOSE 85 02/12/2018 1041   GLUCOSE 90 10/16/2017 0911   BUN 12 02/12/2018 1041   BUN 7.3 10/16/2017 0911   CREATININE 0.65 02/12/2018 1041   CREATININE 0.7 10/16/2017 0911   CALCIUM 9.2 02/12/2018 1041   CALCIUM 9.0 10/16/2017 0911   PROT 6.5 02/12/2018 1041   PROT 6.8 10/16/2017 0911   ALBUMIN 4.0  02/12/2018 1041   ALBUMIN 3.9 10/16/2017 0911   AST 18 02/12/2018 1041   AST 14 10/16/2017 0911   ALT 25 02/12/2018 1041   ALT 11 10/16/2017 0911   ALKPHOS 59 02/12/2018 1041   ALKPHOS 60 10/16/2017 0911   BILITOT 0.5 02/12/2018 1041   BILITOT 0.29 10/16/2017 0911   GFRNONAA >60 02/12/2018 1041   GFRAA >60 02/12/2018 1041         RADIOGRAPHY: Nm Sentinel Node Inj-no Rpt (breast)  Result Date: 03/13/2018 Sulfur colloid was injected by the nuclear medicine technologist for melanoma sentinel node.      IMPRESSION/PLAN:  52 y.o. women with  Left Breast cancer  We discussed adjuvant radiotherapy today.  I recommend adjuvant RT to the left breast and regional nodes (axilla, SCV) in order to reduce risk of locoregional recurrence by 2/3.  The risks, benefits and side effects of this treatment were discussed in detail.  She understands that radiotherapy is associated with skin irritation and fatigue in the acute setting. Late effects can include cosmetic changes and rare injury to internal organs.   She is enthusiastic about proceeding with treatment. A consent form has been signed and placed in her chart.  A total of 5 medically necessary complex treatment devices will be fabricated and supervised by me: 4 fields with MLCs for custom blocks to protect heart, and lungs;  and, a Vac-lok. MORE COMPLEX DEVICES MAY BE MADE IN DOSIMETRY FOR FIELD IN FIELD BEAMS FOR DOSE HOMOGENEITY.  I have requested : 3D Simulation which is medically necessary to give adequate dose to at risk tissues while sparing lungs and heart.  I have requested a DVH of the following structures: lungs, heart, left lumpectomy cavity.    The patient will receive 50 Gy in 25 fractions to the left breast and regional nodes with 4 fields.  This will be followed by a boost.  I spent 30   minutes face to face with the patient and more than 50% of that time was spent in counseling and/or coordination of care.      _____________________________________   Eppie Gibson, MD   This document serves as a record of services personally performed by Eppie Gibson MD. It was created on her behalf by Delton Coombes, a trained medical scribe. The creation of this record is based on the scribe's personal observations and the provider's statements to them.

## 2018-04-01 ENCOUNTER — Telehealth: Payer: Self-pay | Admitting: Hematology and Oncology

## 2018-04-01 NOTE — Telephone Encounter (Signed)
Mailed patient calendar of upcoming august appointments per 6/11 sch message

## 2018-04-14 ENCOUNTER — Ambulatory Visit
Admission: RE | Admit: 2018-04-14 | Discharge: 2018-04-14 | Disposition: A | Discharge status: Home / Self Care | Payer: BLUE CROSS/BLUE SHIELD | Source: Ambulatory Visit | Attending: Radiation Oncology | Admitting: Radiation Oncology

## 2018-04-14 DIAGNOSIS — C50412 Malignant neoplasm of upper-outer quadrant of left female breast: Secondary | ICD-10-CM | POA: Diagnosis not present

## 2018-04-14 DIAGNOSIS — Z170 Estrogen receptor positive status [ER+]: Secondary | ICD-10-CM | POA: Insufficient documentation

## 2018-04-14 DIAGNOSIS — Z510 Encounter for antineoplastic radiation therapy: Secondary | ICD-10-CM | POA: Insufficient documentation

## 2018-04-14 NOTE — Progress Notes (Signed)
Radiation Oncology         [phone] ________________________________  Name: Tonya Jackson MRN: [id number]  Date: 04/14/2018  DOB: 05-Aug-1966  SIMULATION AND TREATMENT PLANNING NOTE  // Special treatment procedure  Outpatient  DIAGNOSIS:     ICD-10-CM   1. Malignant neoplasm of upper-outer quadrant of left breast in female, estrogen receptor positive ([place]) C50.412    Z17.0     NARRATIVE:  The patient was brought to the [facility].  Identity was confirmed.  All relevant records and images related to the planned course of therapy were reviewed.  The patient freely provided informed written consent to proceed with treatment after reviewing the details related to the planned course of therapy. The consent form was witnessed and verified by the simulation staff.    Then, the patient was set-up in a stable reproducible supine position for radiation therapy with her ipsilateral arm over her head, and her upper body secured in a custom-made Vac-lok device.  CT images were obtained.  Surface markings were placed.  The CT images were loaded into the planning software.    Special treatment procedure:  Special treatment procedure was performed today due to the extra time and effort required by myself to plan and prepare this patient for deep inspiration breath hold technique.  I have determined cardiac sparing to be of benefit to this patient to prevent long term cardiac damage due to radiation of the heart.  Bellows were placed on the patient's abdomen. To facilitate cardiac sparing, the patient was coached by the radiation therapists on breath hold techniques and breathing practice was performed. Practice waveforms were obtained. The patient was then scanned while maintaining breath hold in the treatment position.  This image was then transferred over to the imaging specialist. The imaging specialist then created a fusion of the free breathing and breath hold scans using the chest  wall as the stable structure. I personally reviewed the fusion in axial, coronal and sagittal image planes.  Excellent cardiac sparing was obtained.  I felt the patient is an appropriate candidate for breath hold and the patient will be treated as such.  The image fusion was then reviewed with the patient to reinforce the necessity of reproducible breath hold.  TREATMENT PLANNING NOTE: Treatment planning then occurred.  The radiation prescription was entered and confirmed.     A total of 5 medically necessary complex treatment devices were fabricated and supervised by me: 4 fields with MLCs for custom blocks to protect heart, and lungs;  and, a Vac-lok. MORE COMPLEX DEVICES MAY BE MADE IN DOSIMETRY FOR FIELD IN FIELD BEAMS FOR DOSE HOMOGENEITY.  I have requested : 3D Simulation which is medically necessary to give adequate dose to at risk tissues while sparing lungs and heart.  I have requested a DVH of the following structures: lungs, heart, lumpectomy cavity (left).    The patient will receive 50 Gy in 25 fractions to the left breast and SCV, axillary nodes with 4 fields.  This will be followed by a boost.  Optical Surface Tracking Plan:  Since intensity modulated radiotherapy (IMRT) and 3D conformal radiation treatment methods are predicated on accurate and precise positioning for treatment, intrafraction motion monitoring is medically necessary to ensure accurate and safe treatment delivery. The ability to quantify intrafraction motion without excessive ionizing radiation dose can only be performed with optical surface tracking. Accordingly, surface imaging offers the opportunity to obtain 3D measurements of patient position throughout IMRT and 3D  treatments without excessive radiation exposure. I am ordering optical surface tracking for this patient's upcoming course of radiotherapy.  ________________________________   Reference:  Ursula Alert, J, et al. Surface imaging-based  analysis of intrafraction motion for breast radiotherapy patients.Journal of [place], n. 6, nov. 2014. ISSN [id number].  Available at: <http://www.jacmp.org/index.php/jacmp/article/view/4957>.    -----------------------------------  Eppie Gibson, MD

## 2018-04-16 DIAGNOSIS — Z510 Encounter for antineoplastic radiation therapy: Secondary | ICD-10-CM | POA: Diagnosis not present

## 2018-04-16 DIAGNOSIS — C50412 Malignant neoplasm of upper-outer quadrant of left female breast: Secondary | ICD-10-CM | POA: Diagnosis not present

## 2018-04-16 DIAGNOSIS — Z170 Estrogen receptor positive status [ER+]: Secondary | ICD-10-CM | POA: Diagnosis not present

## 2018-04-21 ENCOUNTER — Ambulatory Visit
Admission: RE | Admit: 2018-04-21 | Discharge: 2018-04-21 | Disposition: A | Discharge status: Home / Self Care | Payer: BLUE CROSS/BLUE SHIELD | Source: Ambulatory Visit | Attending: Radiation Oncology | Admitting: Radiation Oncology

## 2018-04-21 DIAGNOSIS — Z170 Estrogen receptor positive status [ER+]: Secondary | ICD-10-CM | POA: Insufficient documentation

## 2018-04-21 DIAGNOSIS — Z510 Encounter for antineoplastic radiation therapy: Secondary | ICD-10-CM | POA: Diagnosis not present

## 2018-04-21 DIAGNOSIS — C50412 Malignant neoplasm of upper-outer quadrant of left female breast: Secondary | ICD-10-CM | POA: Insufficient documentation

## 2018-04-21 MED ORDER — RADIAPLEXRX EX GEL
Freq: Once | CUTANEOUS | Status: AC
  Administered 2018-04-21: 16:00:00 via TOPICAL

## 2018-04-21 MED ORDER — ALRA NON-METALLIC DEODORANT (RAD-ONC)
1.0000 "application " | Freq: Once | TOPICAL | Status: AC
  Administered 2018-04-21: 1 via TOPICAL

## 2018-04-21 NOTE — Progress Notes (Signed)

## 2018-04-22 ENCOUNTER — Ambulatory Visit
Admission: RE | Admit: 2018-04-22 | Discharge: 2018-04-22 | Disposition: A | Discharge status: Home / Self Care | Payer: BLUE CROSS/BLUE SHIELD | Source: Ambulatory Visit | Attending: Radiation Oncology | Admitting: Radiation Oncology

## 2018-04-22 DIAGNOSIS — C50412 Malignant neoplasm of upper-outer quadrant of left female breast: Secondary | ICD-10-CM | POA: Diagnosis not present

## 2018-04-22 DIAGNOSIS — Z170 Estrogen receptor positive status [ER+]: Secondary | ICD-10-CM | POA: Diagnosis not present

## 2018-04-22 DIAGNOSIS — Z510 Encounter for antineoplastic radiation therapy: Secondary | ICD-10-CM | POA: Diagnosis not present

## 2018-04-23 ENCOUNTER — Ambulatory Visit
Admission: RE | Admit: 2018-04-23 | Discharge: 2018-04-23 | Disposition: A | Discharge status: Home / Self Care | Payer: BLUE CROSS/BLUE SHIELD | Source: Ambulatory Visit | Attending: Radiation Oncology | Admitting: Radiation Oncology

## 2018-04-23 DIAGNOSIS — Z510 Encounter for antineoplastic radiation therapy: Secondary | ICD-10-CM | POA: Diagnosis not present

## 2018-04-23 DIAGNOSIS — C50412 Malignant neoplasm of upper-outer quadrant of left female breast: Secondary | ICD-10-CM | POA: Diagnosis not present

## 2018-04-23 DIAGNOSIS — Z170 Estrogen receptor positive status [ER+]: Secondary | ICD-10-CM | POA: Diagnosis not present

## 2018-04-25 ENCOUNTER — Ambulatory Visit
Admission: RE | Admit: 2018-04-25 | Discharge: 2018-04-25 | Disposition: A | Discharge status: Home / Self Care | Payer: BLUE CROSS/BLUE SHIELD | Source: Ambulatory Visit | Attending: Radiation Oncology | Admitting: Radiation Oncology

## 2018-04-25 DIAGNOSIS — Z510 Encounter for antineoplastic radiation therapy: Secondary | ICD-10-CM | POA: Diagnosis not present

## 2018-04-25 DIAGNOSIS — Z170 Estrogen receptor positive status [ER+]: Secondary | ICD-10-CM | POA: Diagnosis not present

## 2018-04-25 DIAGNOSIS — C50412 Malignant neoplasm of upper-outer quadrant of left female breast: Secondary | ICD-10-CM | POA: Diagnosis not present

## 2018-04-28 ENCOUNTER — Ambulatory Visit
Admission: RE | Admit: 2018-04-28 | Discharge: 2018-04-28 | Disposition: A | Discharge status: Home / Self Care | Payer: BLUE CROSS/BLUE SHIELD | Source: Ambulatory Visit | Attending: Radiation Oncology | Admitting: Radiation Oncology

## 2018-04-28 DIAGNOSIS — Z510 Encounter for antineoplastic radiation therapy: Secondary | ICD-10-CM | POA: Diagnosis not present

## 2018-04-28 DIAGNOSIS — Z170 Estrogen receptor positive status [ER+]: Secondary | ICD-10-CM | POA: Diagnosis not present

## 2018-04-28 DIAGNOSIS — C50412 Malignant neoplasm of upper-outer quadrant of left female breast: Secondary | ICD-10-CM | POA: Diagnosis not present

## 2018-04-29 ENCOUNTER — Other Ambulatory Visit: Payer: Self-pay | Admitting: Radiation Oncology

## 2018-04-29 ENCOUNTER — Ambulatory Visit
Admission: RE | Admit: 2018-04-29 | Discharge: 2018-04-29 | Disposition: A | Discharge status: Home / Self Care | Payer: BLUE CROSS/BLUE SHIELD | Source: Ambulatory Visit | Attending: Radiation Oncology | Admitting: Radiation Oncology

## 2018-04-29 DIAGNOSIS — Z170 Estrogen receptor positive status [ER+]: Principal | ICD-10-CM

## 2018-04-29 DIAGNOSIS — C50412 Malignant neoplasm of upper-outer quadrant of left female breast: Secondary | ICD-10-CM | POA: Diagnosis not present

## 2018-04-29 DIAGNOSIS — Z510 Encounter for antineoplastic radiation therapy: Secondary | ICD-10-CM | POA: Diagnosis not present

## 2018-04-30 ENCOUNTER — Ambulatory Visit
Admission: RE | Admit: 2018-04-30 | Discharge: 2018-04-30 | Disposition: A | Discharge status: Home / Self Care | Payer: BLUE CROSS/BLUE SHIELD | Source: Ambulatory Visit | Attending: Radiation Oncology | Admitting: Radiation Oncology

## 2018-04-30 DIAGNOSIS — Z170 Estrogen receptor positive status [ER+]: Secondary | ICD-10-CM | POA: Diagnosis not present

## 2018-04-30 DIAGNOSIS — C50412 Malignant neoplasm of upper-outer quadrant of left female breast: Secondary | ICD-10-CM | POA: Diagnosis not present

## 2018-04-30 DIAGNOSIS — Z510 Encounter for antineoplastic radiation therapy: Secondary | ICD-10-CM | POA: Diagnosis not present

## 2018-05-01 ENCOUNTER — Ambulatory Visit
Admission: RE | Admit: 2018-05-01 | Discharge: 2018-05-01 | Disposition: A | Discharge status: Home / Self Care | Payer: BLUE CROSS/BLUE SHIELD | Source: Ambulatory Visit | Attending: Radiation Oncology | Admitting: Radiation Oncology

## 2018-05-01 DIAGNOSIS — Z510 Encounter for antineoplastic radiation therapy: Secondary | ICD-10-CM | POA: Diagnosis not present

## 2018-05-01 DIAGNOSIS — C50412 Malignant neoplasm of upper-outer quadrant of left female breast: Secondary | ICD-10-CM | POA: Diagnosis not present

## 2018-05-01 DIAGNOSIS — Z170 Estrogen receptor positive status [ER+]: Secondary | ICD-10-CM | POA: Diagnosis not present

## 2018-05-02 ENCOUNTER — Ambulatory Visit
Admission: RE | Admit: 2018-05-02 | Discharge: 2018-05-02 | Disposition: A | Discharge status: Home / Self Care | Payer: BLUE CROSS/BLUE SHIELD | Source: Ambulatory Visit | Attending: Radiation Oncology | Admitting: Radiation Oncology

## 2018-05-02 DIAGNOSIS — C50412 Malignant neoplasm of upper-outer quadrant of left female breast: Secondary | ICD-10-CM | POA: Diagnosis not present

## 2018-05-02 DIAGNOSIS — Z510 Encounter for antineoplastic radiation therapy: Secondary | ICD-10-CM | POA: Diagnosis not present

## 2018-05-02 DIAGNOSIS — Z170 Estrogen receptor positive status [ER+]: Secondary | ICD-10-CM | POA: Diagnosis not present

## 2018-05-05 ENCOUNTER — Ambulatory Visit
Admission: RE | Admit: 2018-05-05 | Discharge: 2018-05-05 | Disposition: A | Discharge status: Home / Self Care | Payer: BLUE CROSS/BLUE SHIELD | Source: Ambulatory Visit | Attending: Radiation Oncology | Admitting: Radiation Oncology

## 2018-05-05 DIAGNOSIS — Z170 Estrogen receptor positive status [ER+]: Secondary | ICD-10-CM | POA: Diagnosis not present

## 2018-05-05 DIAGNOSIS — Z510 Encounter for antineoplastic radiation therapy: Secondary | ICD-10-CM | POA: Diagnosis not present

## 2018-05-05 DIAGNOSIS — C50412 Malignant neoplasm of upper-outer quadrant of left female breast: Secondary | ICD-10-CM | POA: Diagnosis not present

## 2018-05-06 ENCOUNTER — Ambulatory Visit
Admission: RE | Admit: 2018-05-06 | Discharge: 2018-05-06 | Disposition: A | Discharge status: Home / Self Care | Payer: BLUE CROSS/BLUE SHIELD | Source: Ambulatory Visit | Attending: Radiation Oncology | Admitting: Radiation Oncology

## 2018-05-06 DIAGNOSIS — Z170 Estrogen receptor positive status [ER+]: Secondary | ICD-10-CM | POA: Diagnosis not present

## 2018-05-06 DIAGNOSIS — Z510 Encounter for antineoplastic radiation therapy: Secondary | ICD-10-CM | POA: Diagnosis not present

## 2018-05-06 DIAGNOSIS — C50412 Malignant neoplasm of upper-outer quadrant of left female breast: Secondary | ICD-10-CM | POA: Diagnosis not present

## 2018-05-07 ENCOUNTER — Ambulatory Visit
Admission: RE | Admit: 2018-05-07 | Discharge: 2018-05-07 | Disposition: A | Discharge status: Home / Self Care | Payer: BLUE CROSS/BLUE SHIELD | Source: Ambulatory Visit | Attending: Radiation Oncology | Admitting: Radiation Oncology

## 2018-05-07 DIAGNOSIS — Z170 Estrogen receptor positive status [ER+]: Secondary | ICD-10-CM | POA: Diagnosis not present

## 2018-05-07 DIAGNOSIS — C50412 Malignant neoplasm of upper-outer quadrant of left female breast: Secondary | ICD-10-CM | POA: Diagnosis not present

## 2018-05-07 DIAGNOSIS — Z510 Encounter for antineoplastic radiation therapy: Secondary | ICD-10-CM | POA: Diagnosis not present

## 2018-05-08 ENCOUNTER — Ambulatory Visit
Admission: RE | Admit: 2018-05-08 | Discharge: 2018-05-08 | Disposition: A | Discharge status: Home / Self Care | Payer: BLUE CROSS/BLUE SHIELD | Source: Ambulatory Visit | Attending: Radiation Oncology | Admitting: Radiation Oncology

## 2018-05-08 DIAGNOSIS — Z170 Estrogen receptor positive status [ER+]: Secondary | ICD-10-CM | POA: Diagnosis not present

## 2018-05-08 DIAGNOSIS — C50412 Malignant neoplasm of upper-outer quadrant of left female breast: Secondary | ICD-10-CM | POA: Diagnosis not present

## 2018-05-08 DIAGNOSIS — Z510 Encounter for antineoplastic radiation therapy: Secondary | ICD-10-CM | POA: Diagnosis not present

## 2018-05-09 ENCOUNTER — Ambulatory Visit
Admission: RE | Admit: 2018-05-09 | Discharge: 2018-05-09 | Disposition: A | Discharge status: Home / Self Care | Payer: BLUE CROSS/BLUE SHIELD | Source: Ambulatory Visit | Attending: Radiation Oncology | Admitting: Radiation Oncology

## 2018-05-09 DIAGNOSIS — C50412 Malignant neoplasm of upper-outer quadrant of left female breast: Secondary | ICD-10-CM | POA: Diagnosis not present

## 2018-05-09 DIAGNOSIS — Z510 Encounter for antineoplastic radiation therapy: Secondary | ICD-10-CM | POA: Diagnosis not present

## 2018-05-09 DIAGNOSIS — Z170 Estrogen receptor positive status [ER+]: Secondary | ICD-10-CM | POA: Diagnosis not present

## 2018-05-12 ENCOUNTER — Ambulatory Visit
Admission: RE | Admit: 2018-05-12 | Discharge: 2018-05-12 | Disposition: A | Discharge status: Home / Self Care | Payer: BLUE CROSS/BLUE SHIELD | Source: Ambulatory Visit | Attending: Radiation Oncology | Admitting: Radiation Oncology

## 2018-05-12 DIAGNOSIS — Z170 Estrogen receptor positive status [ER+]: Secondary | ICD-10-CM | POA: Diagnosis not present

## 2018-05-12 DIAGNOSIS — Z510 Encounter for antineoplastic radiation therapy: Secondary | ICD-10-CM | POA: Diagnosis not present

## 2018-05-12 DIAGNOSIS — C50412 Malignant neoplasm of upper-outer quadrant of left female breast: Secondary | ICD-10-CM | POA: Diagnosis not present

## 2018-05-13 ENCOUNTER — Ambulatory Visit
Admission: RE | Admit: 2018-05-13 | Discharge: 2018-05-13 | Disposition: A | Discharge status: Home / Self Care | Payer: BLUE CROSS/BLUE SHIELD | Source: Ambulatory Visit | Attending: Radiation Oncology | Admitting: Radiation Oncology

## 2018-05-13 DIAGNOSIS — Z510 Encounter for antineoplastic radiation therapy: Secondary | ICD-10-CM | POA: Diagnosis not present

## 2018-05-13 DIAGNOSIS — Z170 Estrogen receptor positive status [ER+]: Secondary | ICD-10-CM | POA: Diagnosis not present

## 2018-05-13 DIAGNOSIS — C50412 Malignant neoplasm of upper-outer quadrant of left female breast: Secondary | ICD-10-CM | POA: Diagnosis not present

## 2018-05-14 ENCOUNTER — Ambulatory Visit
Admission: RE | Admit: 2018-05-14 | Discharge: 2018-05-14 | Disposition: A | Discharge status: Home / Self Care | Payer: BLUE CROSS/BLUE SHIELD | Source: Ambulatory Visit | Attending: Radiation Oncology | Admitting: Radiation Oncology

## 2018-05-14 DIAGNOSIS — Z510 Encounter for antineoplastic radiation therapy: Secondary | ICD-10-CM | POA: Diagnosis not present

## 2018-05-14 DIAGNOSIS — Z170 Estrogen receptor positive status [ER+]: Secondary | ICD-10-CM | POA: Diagnosis not present

## 2018-05-14 DIAGNOSIS — C50412 Malignant neoplasm of upper-outer quadrant of left female breast: Secondary | ICD-10-CM | POA: Diagnosis not present

## 2018-05-15 ENCOUNTER — Other Ambulatory Visit: Payer: Self-pay

## 2018-05-15 ENCOUNTER — Ambulatory Visit
Admission: RE | Admit: 2018-05-15 | Discharge: 2018-05-15 | Disposition: A | Discharge status: Home / Self Care | Payer: BLUE CROSS/BLUE SHIELD | Source: Ambulatory Visit | Attending: Radiation Oncology | Admitting: Radiation Oncology

## 2018-05-15 ENCOUNTER — Encounter: Payer: Self-pay | Admitting: Rehabilitation

## 2018-05-15 ENCOUNTER — Ambulatory Visit: Payer: BLUE CROSS/BLUE SHIELD | Attending: Radiation Oncology | Admitting: Rehabilitation

## 2018-05-15 DIAGNOSIS — Z510 Encounter for antineoplastic radiation therapy: Secondary | ICD-10-CM | POA: Diagnosis not present

## 2018-05-15 DIAGNOSIS — Z9189 Other specified personal risk factors, not elsewhere classified: Secondary | ICD-10-CM

## 2018-05-15 DIAGNOSIS — Z170 Estrogen receptor positive status [ER+]: Secondary | ICD-10-CM | POA: Insufficient documentation

## 2018-05-15 DIAGNOSIS — C50412 Malignant neoplasm of upper-outer quadrant of left female breast: Secondary | ICD-10-CM

## 2018-05-15 DIAGNOSIS — M25612 Stiffness of left shoulder, not elsewhere classified: Secondary | ICD-10-CM | POA: Insufficient documentation

## 2018-05-15 NOTE — Patient Instructions (Signed)
Gave pt HEP consisting of doorway stretch, pectoralis wall stretch, and wrist flexor stretch 3x30" on medbridge but code did not copy

## 2018-05-15 NOTE — Therapy (Signed)
[place], [place], [address] Phone: [phone]   Fax:  [phone]  Physical Therapy Evaluation  Patient Details  Name: Tonya Jackson MRN: [id number] Date of Birth: [birth year] Referring Provider: Dr. Sondra Come   Encounter Date: 05/15/2018  PT End of Session - 05/15/18 2148    Visit Number  1    Number of Visits  6    Date for PT Re-Evaluation  06/26/18    PT Start Time  1602    PT Stop Time  1642    PT Time Calculation (min)  40 min    Activity Tolerance  Patient tolerated treatment well    Behavior During Therapy  [facility] for tasks assessed/performed       Past Medical History:  Diagnosis Date  . Dental bridge present    upper and lower  . GERD (gastroesophageal reflux disease)    occasional - TUMS as needed  . History of chemotherapy    finished chemo 02/05/2018  . History of left breast cancer 2018    Past Surgical History:  Procedure Laterality Date  . APPENDECTOMY  1982  . BREAST LUMPECTOMY WITH RADIOACTIVE SEED AND SENTINEL LYMPH NODE BIOPSY Left 03/13/2018   Procedure: LEFT BREAST LUMPECTOMY WITH RADIOACTIVE SEED AND LEFT RADIOACTIVE SEED TARGETED AXILLARY LYMPH NODE EXCISION AND LEFT SENTINEL LYMPH NODE BIOPSY;  Surgeon: Jovita Kussmaul, MD;  Location: [facility];  Service: General;  Laterality: Left;  . PORT-A-CATH REMOVAL Right 03/13/2018   Procedure: REMOVAL PORT-A-CATH;  Surgeon: Jovita Kussmaul, MD;  Location: [facility];  Service: General;  Laterality: Right;  . PORTACATH PLACEMENT Right 09/27/2017   Procedure: INSERTION PORT-A-CATH;  Surgeon: Jovita Kussmaul, MD;  Location: [facility];  Service: General;  Laterality: Right;  . WISDOM TOOTH EXTRACTION      There were no vitals filed for this visit.   Subjective Assessment - 05/15/18 1604    Subjective  I started having some some forearm pain and now it feels swollen in the forearm.  2.5  more weeks left of radiation.  Still some pain in the axilla with reaching overhead and pain in the forearm with reaching out to the side.      Pertinent History  Lt lumpectomy and 6 lymph nodes removed 03/13/18, neoadjuvant chemo therapy completed, IDC with DCIS grade 3 ERpos/PRneg/Her2 positive, completing radiation    Limitations  Lifting    Patient Stated Goals  See if I need any therapy    Currently in Pain?  No/denies         [facility] PT Assessment - 05/15/18 0001      Assessment   Medical Diagnosis  Lt breast cancer    Referring Provider  Dr. Sondra Come    Onset Date/Surgical Date  03/13/18    Hand Dominance  Right    Prior Therapy  no      Precautions   Precaution Comments  lymphedema, cancer      Restrictions   Weight Bearing Restrictions  No      Balance Screen   Has the patient fallen in the past 6 months  No    Has the patient had a decrease in activity level because of a fear of falling?   No    Is the patient reluctant to leave their home because of a fear of falling?   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  Does have one small region of puffiness in the lt forearm more over the wrist flexor tendons      Coordination   Gross Motor Movements are Fluid and Coordinated  Yes      ROM / Strength   AROM / PROM / Strength  AROM;PROM      AROM   Overall AROM   Deficits    AROM Assessment Site  Shoulder    Right/Left Shoulder  Right;Left    Right  Shoulder Flexion  155 Degrees    Right Shoulder ABduction  165 Degrees    Right Shoulder Internal Rotation  75 Degrees    Right Shoulder External Rotation  80 Degrees    Left Shoulder Flexion  150 Degrees    Left Shoulder ABduction  155 Degrees elbow does not want to straighten    Left Shoulder Internal Rotation  75 Degrees    Left Shoulder External Rotation  80 Degrees      PROM   PROM Assessment Site  Shoulder    Right/Left Shoulder  Right;Left    Left Shoulder Flexion  135 Degrees pull in the elbow    Left Shoulder ABduction  165 Degrees    Left Shoulder Internal Rotation  85 Degrees    Left Shoulder External Rotation  90 Degrees    Left Shoulder Horizontal ABduction  30 Degrees pull in elbow         LYMPHEDEMA/ONCOLOGY QUESTIONNAIRE - 05/15/18 1620      Type   Cancer Type  Left breast cancer      Surgeries   Lumpectomy Date  03/13/18    Sentinel Lymph Node Biopsy Date  03/13/18    Number Lymph Nodes Removed  6 0/6      Treatment   Active Chemotherapy Treatment  No    Past Chemotherapy Treatment  Yes    Active Radiation Treatment  Yes    Current Hormone Treatment  No      What other symptoms do you have   Are you Having Heaviness or Tightness  No    Are you having Pain  No    Are you having pitting edema  No    Do you have infections  No      Lymphedema Assessments   Lymphedema Assessments  Upper extremities      Right Upper Extremity Lymphedema   15 cm Proximal to Olecranon Process  27.7 cm    10 cm Proximal to Olecranon Process  25.9 cm    Olecranon Process  25 cm    15 cm Proximal to Ulnar Styloid Process  23.7 cm    10 cm Proximal to Ulnar Styloid Process  20 cm    Just Proximal to Ulnar Styloid Process  16.8 cm    Across Hand at [employer]  20.5 cm    At [place] of 2nd Digit  7 cm    Other  9.5  Left Upper Extremity Lymphedema   15 cm Proximal to Olecranon Process  27.4 cm    10 cm Proximal to Olecranon Process  25.7 cm    Olecranon Process   25.2 cm    15 cm Proximal to Ulnar Styloid Process  23.5 cm    10 cm Proximal to Ulnar Styloid Process  21 cm    Just Proximal to Ulnar Styloid Process  16.5 cm    Across Hand at [employer]  20.3 cm    At [place] of 2nd Digit  6.3 cm    Other  9.5          Quick Dash - 05/15/18 0001    Open a tight or new jar  Moderate difficulty    Do heavy household chores (wash walls, wash floors)  Moderate difficulty    Carry a shopping bag or briefcase  Mild difficulty    Wash your back  Moderate difficulty    Use a knife to cut food  No difficulty    Recreational activities in which you take some force or impact through your arm, shoulder, or hand (golf, hammering, tennis)  Moderate difficulty    During the past week, to what extent has your arm, shoulder or hand problem interfered with your normal social activities with family, friends, neighbors, or groups?  Slightly    During the past week, to what extent has your arm, shoulder or hand problem limited your work or other regular daily activities  Slightly    Arm, shoulder, or hand pain.  Mild    Tingling (pins and needles) in your arm, shoulder, or hand  Moderate    Difficulty Sleeping  Moderate difficulty    DASH Score  36.36 %        Objective measurements completed on examination: See above findings.      [facility] Adult PT Treatment/Exercise - 05/15/18 0001      Exercises   Exercises  Other Exercises    Other Exercises   Gave pt HEP consisting of doorway stretch, pectoralis wall stretch, and wrist flexor stretch 3x30" on medbridge but code did not copy      Manual Therapy   Manual Therapy  Edema management             PT Education - 05/15/18 2148    Education Details  lymphedema education, compression uses, HEP    Person(s) Educated  Patient    Methods  Explanation    Comprehension  Verbalized understanding;Returned demonstration                  Plan - 05/15/18 2150    Clinical Impression Statement   Achsah presents today with signs and symptoms consistent with axillary web syndrome post Lt lumpectomy, removal of 6 lymph nodes, and currently undergoing radiation.  Most of her pain and pulling occurs at the elbow with flexion and abduction.  Her circumferential measures are equal bilaterally and compared to pre-op numbers. She will benefit from a few PT sessions to assit with shoulder ROM and for education regarding lymphedema.      Clinical Presentation  Evolving    Clinical Presentation due to:  in radiation    Clinical Decision Making  Moderate    Rehab Potential  Excellent    PT Frequency  1x / week    PT Duration  6 weeks    PT Treatment/Interventions  ADLs/Self Care Home Management;DME Instruction;Therapeutic exercise;Patient/family education;Manual techniques;Taping    PT Next Visit  Plan  Lt shoulder PROM, STM, cording release work,     PT Home Exercise Plan  given doorway stretch, pectoralis wall stretch, and wrist flexor stretch on 7/25    Consulted and Agree with Plan of Care  Patient       Patient will benefit from skilled therapeutic intervention in order to improve the following deficits and impairments:  Decreased knowledge of precautions, Decreased range of motion, Decreased knowledge of use of DME, Increased fascial restricitons  Visit Diagnosis: Stiffness of left shoulder, not elsewhere classified - Plan: PT plan of care cert/re-cert  Malignant neoplasm of upper-outer quadrant of left breast in female, estrogen receptor positive ([place]) - Plan: PT plan of care cert/re-cert  At risk for lymphedema - Plan: PT plan of care cert/re-cert     Problem List Patient Active Problem List   Diagnosis Date Noted  . Port-A-Cath in place 10/16/2017  . Malignant neoplasm of upper-outer quadrant of left breast in female, estrogen receptor positive ([place]) 09/03/2017    Shan Levans, PT 05/15/2018, 9:58 PM  [facility] [place], [place], [address] Phone: [phone]   Fax:  [phone]  Name: METZLI POLLICK MRN: [id number] Date of Birth: January 18, 1966

## 2018-05-16 ENCOUNTER — Ambulatory Visit
Admission: RE | Admit: 2018-05-16 | Discharge: 2018-05-16 | Disposition: A | Discharge status: Home / Self Care | Payer: BLUE CROSS/BLUE SHIELD | Source: Ambulatory Visit | Attending: Radiation Oncology | Admitting: Radiation Oncology

## 2018-05-16 DIAGNOSIS — C50412 Malignant neoplasm of upper-outer quadrant of left female breast: Secondary | ICD-10-CM | POA: Diagnosis not present

## 2018-05-16 DIAGNOSIS — Z510 Encounter for antineoplastic radiation therapy: Secondary | ICD-10-CM | POA: Diagnosis not present

## 2018-05-16 DIAGNOSIS — Z170 Estrogen receptor positive status [ER+]: Secondary | ICD-10-CM | POA: Diagnosis not present

## 2018-05-19 ENCOUNTER — Ambulatory Visit
Admission: RE | Admit: 2018-05-19 | Discharge: 2018-05-19 | Disposition: A | Discharge status: Home / Self Care | Payer: BLUE CROSS/BLUE SHIELD | Source: Ambulatory Visit | Attending: Radiation Oncology | Admitting: Radiation Oncology

## 2018-05-19 DIAGNOSIS — C50412 Malignant neoplasm of upper-outer quadrant of left female breast: Secondary | ICD-10-CM | POA: Diagnosis not present

## 2018-05-19 DIAGNOSIS — Z510 Encounter for antineoplastic radiation therapy: Secondary | ICD-10-CM | POA: Diagnosis not present

## 2018-05-19 DIAGNOSIS — Z170 Estrogen receptor positive status [ER+]: Secondary | ICD-10-CM | POA: Diagnosis not present

## 2018-05-20 ENCOUNTER — Ambulatory Visit
Admission: RE | Admit: 2018-05-20 | Discharge: 2018-05-20 | Disposition: A | Discharge status: Home / Self Care | Payer: BLUE CROSS/BLUE SHIELD | Source: Ambulatory Visit | Attending: Radiation Oncology | Admitting: Radiation Oncology

## 2018-05-20 DIAGNOSIS — Z170 Estrogen receptor positive status [ER+]: Secondary | ICD-10-CM | POA: Diagnosis not present

## 2018-05-20 DIAGNOSIS — C50412 Malignant neoplasm of upper-outer quadrant of left female breast: Secondary | ICD-10-CM | POA: Diagnosis not present

## 2018-05-20 DIAGNOSIS — Z510 Encounter for antineoplastic radiation therapy: Secondary | ICD-10-CM | POA: Diagnosis not present

## 2018-05-21 ENCOUNTER — Ambulatory Visit
Admission: RE | Admit: 2018-05-21 | Discharge: 2018-05-21 | Disposition: A | Discharge status: Home / Self Care | Payer: BLUE CROSS/BLUE SHIELD | Source: Ambulatory Visit | Attending: Radiation Oncology | Admitting: Radiation Oncology

## 2018-05-21 DIAGNOSIS — Z510 Encounter for antineoplastic radiation therapy: Secondary | ICD-10-CM | POA: Diagnosis not present

## 2018-05-21 DIAGNOSIS — Z170 Estrogen receptor positive status [ER+]: Secondary | ICD-10-CM | POA: Diagnosis not present

## 2018-05-21 DIAGNOSIS — C50412 Malignant neoplasm of upper-outer quadrant of left female breast: Secondary | ICD-10-CM | POA: Diagnosis not present

## 2018-05-22 ENCOUNTER — Ambulatory Visit
Admission: RE | Admit: 2018-05-22 | Discharge: 2018-05-22 | Disposition: A | Discharge status: Home / Self Care | Payer: BLUE CROSS/BLUE SHIELD | Source: Ambulatory Visit | Attending: Radiation Oncology | Admitting: Radiation Oncology

## 2018-05-22 DIAGNOSIS — C50412 Malignant neoplasm of upper-outer quadrant of left female breast: Secondary | ICD-10-CM | POA: Insufficient documentation

## 2018-05-22 DIAGNOSIS — Z510 Encounter for antineoplastic radiation therapy: Secondary | ICD-10-CM | POA: Insufficient documentation

## 2018-05-23 ENCOUNTER — Ambulatory Visit
Admission: RE | Admit: 2018-05-23 | Discharge: 2018-05-23 | Disposition: A | Discharge status: Home / Self Care | Payer: BLUE CROSS/BLUE SHIELD | Source: Ambulatory Visit | Attending: Radiation Oncology | Admitting: Radiation Oncology

## 2018-05-23 DIAGNOSIS — C50412 Malignant neoplasm of upper-outer quadrant of left female breast: Secondary | ICD-10-CM | POA: Diagnosis not present

## 2018-05-23 DIAGNOSIS — Z510 Encounter for antineoplastic radiation therapy: Secondary | ICD-10-CM | POA: Diagnosis not present

## 2018-05-26 ENCOUNTER — Ambulatory Visit
Admission: RE | Admit: 2018-05-26 | Discharge: 2018-05-26 | Disposition: A | Discharge status: Home / Self Care | Payer: BLUE CROSS/BLUE SHIELD | Source: Ambulatory Visit | Attending: Radiation Oncology | Admitting: Radiation Oncology

## 2018-05-26 DIAGNOSIS — Z510 Encounter for antineoplastic radiation therapy: Secondary | ICD-10-CM | POA: Diagnosis not present

## 2018-05-26 DIAGNOSIS — C50412 Malignant neoplasm of upper-outer quadrant of left female breast: Secondary | ICD-10-CM | POA: Diagnosis not present

## 2018-05-27 ENCOUNTER — Ambulatory Visit: Payer: BLUE CROSS/BLUE SHIELD

## 2018-05-27 ENCOUNTER — Ambulatory Visit
Admission: RE | Admit: 2018-05-27 | Discharge: 2018-05-27 | Disposition: A | Discharge status: Home / Self Care | Payer: BLUE CROSS/BLUE SHIELD | Source: Ambulatory Visit | Attending: Radiation Oncology | Admitting: Radiation Oncology

## 2018-05-27 DIAGNOSIS — Z170 Estrogen receptor positive status [ER+]: Principal | ICD-10-CM

## 2018-05-27 DIAGNOSIS — C50412 Malignant neoplasm of upper-outer quadrant of left female breast: Secondary | ICD-10-CM | POA: Diagnosis not present

## 2018-05-27 DIAGNOSIS — Z510 Encounter for antineoplastic radiation therapy: Secondary | ICD-10-CM | POA: Diagnosis not present

## 2018-05-27 MED ORDER — RADIAPLEXRX EX GEL
Freq: Once | CUTANEOUS | Status: AC
  Administered 2018-05-27: 17:00:00 via TOPICAL

## 2018-05-28 ENCOUNTER — Ambulatory Visit
Admission: RE | Admit: 2018-05-28 | Discharge: 2018-05-28 | Disposition: A | Discharge status: Home / Self Care | Payer: BLUE CROSS/BLUE SHIELD | Source: Ambulatory Visit | Attending: Radiation Oncology | Admitting: Radiation Oncology

## 2018-05-28 DIAGNOSIS — C50412 Malignant neoplasm of upper-outer quadrant of left female breast: Secondary | ICD-10-CM | POA: Diagnosis not present

## 2018-05-28 DIAGNOSIS — Z510 Encounter for antineoplastic radiation therapy: Secondary | ICD-10-CM | POA: Diagnosis not present

## 2018-05-29 ENCOUNTER — Ambulatory Visit
Admission: RE | Admit: 2018-05-29 | Discharge: 2018-05-29 | Disposition: A | Discharge status: Home / Self Care | Payer: BLUE CROSS/BLUE SHIELD | Source: Ambulatory Visit | Attending: Radiation Oncology | Admitting: Radiation Oncology

## 2018-05-29 DIAGNOSIS — Z510 Encounter for antineoplastic radiation therapy: Secondary | ICD-10-CM | POA: Diagnosis not present

## 2018-05-29 DIAGNOSIS — C50412 Malignant neoplasm of upper-outer quadrant of left female breast: Secondary | ICD-10-CM | POA: Diagnosis not present

## 2018-05-30 ENCOUNTER — Ambulatory Visit
Admission: RE | Admit: 2018-05-30 | Discharge: 2018-05-30 | Disposition: A | Discharge status: Home / Self Care | Payer: BLUE CROSS/BLUE SHIELD | Source: Ambulatory Visit | Attending: Radiation Oncology | Admitting: Radiation Oncology

## 2018-05-30 DIAGNOSIS — C50412 Malignant neoplasm of upper-outer quadrant of left female breast: Secondary | ICD-10-CM | POA: Diagnosis not present

## 2018-05-30 DIAGNOSIS — Z510 Encounter for antineoplastic radiation therapy: Secondary | ICD-10-CM | POA: Diagnosis not present

## 2018-06-02 ENCOUNTER — Encounter: Payer: Self-pay | Admitting: Medical Oncology

## 2018-06-02 ENCOUNTER — Encounter: Payer: Self-pay | Admitting: Radiation Oncology

## 2018-06-02 ENCOUNTER — Inpatient Hospital Stay: Payer: BLUE CROSS/BLUE SHIELD | Attending: Hematology and Oncology | Admitting: Hematology and Oncology

## 2018-06-02 ENCOUNTER — Ambulatory Visit
Admission: RE | Admit: 2018-06-02 | Discharge: 2018-06-02 | Disposition: A | Discharge status: Home / Self Care | Payer: BLUE CROSS/BLUE SHIELD | Source: Ambulatory Visit | Attending: Radiation Oncology | Admitting: Radiation Oncology

## 2018-06-02 DIAGNOSIS — C50412 Malignant neoplasm of upper-outer quadrant of left female breast: Secondary | ICD-10-CM | POA: Diagnosis not present

## 2018-06-02 DIAGNOSIS — Z923 Personal history of irradiation: Secondary | ICD-10-CM | POA: Diagnosis not present

## 2018-06-02 DIAGNOSIS — Z170 Estrogen receptor positive status [ER+]: Secondary | ICD-10-CM

## 2018-06-02 DIAGNOSIS — Z79811 Long term (current) use of aromatase inhibitors: Secondary | ICD-10-CM | POA: Insufficient documentation

## 2018-06-02 DIAGNOSIS — Z510 Encounter for antineoplastic radiation therapy: Secondary | ICD-10-CM | POA: Diagnosis not present

## 2018-06-02 DIAGNOSIS — Z9221 Personal history of antineoplastic chemotherapy: Secondary | ICD-10-CM | POA: Insufficient documentation

## 2018-06-02 MED ORDER — LETROZOLE 2.5 MG PO TABS: 2.5000 mg | ORAL_TABLET | Freq: Every day | ORAL | 3 refills | Status: DC

## 2018-06-02 NOTE — Assessment & Plan Note (Signed)
08/29/2017: Screening detected left breast mass 4 cm at 12 o'clock position, 1.4 cm lymph node the axilla, biopsy of the mass and the lymph nodes were positive for IDC with DCIS grade 3 ER 70% PR 0%, KI 2%, HER-2 negative ratio 1.77, T2N1 stage IIIa clinical stage AJCC 8  Mammaprinthigh risk luminal type B.   03/13/2018: Left lumpectomy: IDC with DCIS grade 3, 0.6 cm, 0/6 lymph nodes negative, margins negative, ER 70%, PR 0%, HER-2 negative ratio 1.77, RCB class II ypT1b, ypN0  Treatment summary: 1.Neoadjuvant chemotherapy with dose dense Adriamycin and Cytoxan followed by Taxol x12completed 02/12/2018 2. Followed by breast conserving surgery and targeted lymph node dissection on 03/13/2018 3. Followed by radiation 04/21/2018 to 05/30/2018 4. Follow-up antiestrogen therapy --------------------------------------------------------------------

## 2018-06-02 NOTE — Progress Notes (Signed)
Patient Care Team: [name], MD as PCP - General (Family Medicine)  DIAGNOSIS:  Encounter Diagnosis  Name Primary?  . Malignant neoplasm of upper-outer quadrant of left breast in female, estrogen receptor positive ([place])     SUMMARY OF ONCOLOGIC HISTORY:   Malignant neoplasm of upper-outer quadrant of left breast in female, estrogen receptor positive ([place])   08/29/2017 Initial Diagnosis    Screening detected left breast mass 4 cm at 12 o'clock position, 1.4 cm lymph node the axilla, biopsy of the mass and the lymph nodes were positive for IDC with DCIS grade 3 ER 70% PR 0%, KI 2%, HER-2 negative ratio 1.77, T2N1 stage IIIa clinical stage AJCC 8    09/11/2017 Breast MRI    Left breast malignancy with associated linear NME extending posteriorly as well as satellite nodules measures up to 5.3 cm, at least 4 abnormal left axillary lymph nodes    09/11/2017 Imaging    CT CAP and Bone scan negative for metastatic disease    09/18/2017 Miscellaneous    Mammaprint high risk luminal type B.  Average 10-year risk of recurrence untreated 29%; with chemotherapy and hormonal therapy distant metastasis free interval at 5 years 94.6%    10/02/2017 - 02/12/2018 Neo-Adjuvant Chemotherapy    Neoadjuvant chemotherapy with dose dense Adriamycin and Cytoxan x4 followed by Taxol weekly x12    02/14/2018 Breast MRI    Near complete imaging response of the known cancer in the left breast neoadjuvant chemotherapy. There is a 2 mm residual focus of enhancement approximately 1.2 cm inferior and medial to the biopsy marking clip.     03/13/2018 Surgery    Left lumpectomy: IDC with DCIS grade 3, 0.6 cm, 0/6 lymph nodes negative, margins negative, ER 70%, PR 0%, HER-2 negative ratio 1.77, RCB class II ypT1b, ypN0    04/21/2018 - 06/02/2018 Radiation Therapy    Adj XRT     CHIEF COMPLIANT: Follow-up on adjuvant radiation  INTERVAL HISTORY: Tonya Jackson is a 52 year old with above-mentioned history of  left breast cancer treated with neoadjuvant chemotherapy followed by lumpectomy and today's last day of radiation.  She had tolerated the treatment extremely well.  She is ready to start with antiestrogen therapy.  REVIEW OF SYSTEMS:   Constitutional: Denies fevers, chills or abnormal weight loss Eyes: Denies blurriness of vision Ears, nose, mouth, throat, and face: Denies mucositis or sore throat Respiratory: Denies cough, dyspnea or wheezes Cardiovascular: Denies palpitation, chest discomfort Gastrointestinal:  Denies nausea, heartburn or change in bowel habits Skin: Denies abnormal skin rashes Lymphatics: Denies new lymphadenopathy or easy bruising Neurological:Denies numbness, tingling or new weaknesses Behavioral/Psych: Mood is stable, no new changes  Extremities: No lower extremity edema Breast: Mild radiation dermatitis All other systems were reviewed with the patient and are negative.  I have reviewed the past medical history, past surgical history, social history and family history with the patient and they are unchanged from previous note.  ALLERGIES:  is allergic to adhesive [tape].  MEDICATIONS:  Current Outpatient Medications  Medication Sig Dispense Refill  . acetaminophen (TYLENOL) 325 MG tablet Take 650 mg by mouth every 6 (six) hours as needed.    Marland Kitchen HYDROcodone-acetaminophen (NORCO/VICODIN) 5-325 MG tablet Take 1-2 tablets by mouth every 6 (six) hours as needed for moderate pain or severe pain. 15 tablet 0  . letrozole (FEMARA) 2.5 MG tablet Take 1 tablet (2.5 mg total) by mouth daily. 90 tablet 3   No current facility-administered medications for this visit.  PHYSICAL EXAMINATION: ECOG PERFORMANCE STATUS: 1 - Symptomatic but completely ambulatory  Vitals:   06/02/18 1455  BP: 113/80  Pulse: 85  Resp: 20  Temp: 98.4 F (36.9 C)  SpO2: 100%   Filed Weights   06/02/18 1455  Weight: 136 lb 9.6 oz (62 kg)    GENERAL:alert, no distress and  comfortable SKIN: skin color, texture, turgor are normal, no rashes or significant lesions EYES: normal, Conjunctiva are pink and non-injected, sclera clear OROPHARYNX:no exudate, no erythema and lips, buccal mucosa, and tongue normal  NECK: supple, thyroid normal size, non-tender, without nodularity LYMPH:  no palpable lymphadenopathy in the cervical, axillary or inguinal LUNGS: clear to auscultation and percussion with normal breathing effort HEART: regular rate & rhythm and no murmurs and no lower extremity edema ABDOMEN:abdomen soft, non-tender and normal bowel sounds MUSCULOSKELETAL:no cyanosis of digits and no clubbing  NEURO: alert & oriented x 3 with fluent speech, no focal motor/sensory deficits EXTREMITIES: No lower extremity edema   LABORATORY DATA:  I have reviewed the data as listed CMP Latest Ref Rng & Units 02/12/2018 02/05/2018 01/29/2018  Glucose 70 - 140 mg/dL 85 78 88  BUN 7 - 26 mg/dL _0 Creatinine 0.60 - 1.10 mg/dL 0.65 0.63 0.65  Sodium 136 - 145 mmol/L 140 139 141  Potassium 3.5 - 5.1 mmol/L 3.9 3.9 4.0  Chloride 98 - 109 mmol/L 106 105 104  CO2 22 - 29 mmol/L 24 30(H) 29  Calcium 8.4 - 10.4 mg/dL 9.2 9.3 9.3  Total Protein 6.4 - 8.3 g/dL 6.5 6.7 6.5  Total Bilirubin 0.2 - 1.2 mg/dL 0.5 0.5 0.4  Alkaline Phos 40 - 150 U/L 59 67 66  AST 5 - 34 U/L _1 ALT 0 - 55 U/L 25 31 68(H)    Lab Results  Component Value Date   WBC 2.6 (L) 02/12/2018   HGB 10.5 (L) 02/12/2018   HCT 31.1 (L) 02/12/2018   MCV 94.1 02/12/2018   PLT 195 02/12/2018   NEUTROABS 1.7 02/12/2018    ASSESSMENT & PLAN:  Malignant neoplasm of upper-outer quadrant of left breast in female, estrogen receptor positive ([place]) 08/29/2017: Screening detected left breast mass 4 cm at 12 o'clock position, 1.4 cm lymph node the axilla, biopsy of the mass and the lymph nodes were positive for IDC with DCIS grade 3 ER 70% PR 0%, KI 2%, HER-2 negative ratio 1.77, T2N1 stage IIIa clinical stage AJCC  8  Mammaprinthigh risk luminal type B.   03/13/2018: Left lumpectomy: IDC with DCIS grade 3, 0.6 cm, 0/6 lymph nodes negative, margins negative, ER 70%, PR 0%, HER-2 negative ratio 1.77, RCB class II ypT1b, ypN0  Treatment summary: 1.Neoadjuvant chemotherapy with dose dense Adriamycin and Cytoxan followed by Taxol x12completed 02/12/2018 2. Followed by breast conserving surgery and targeted lymph node dissection on 03/13/2018 3. Followed by radiation 04/21/2018 to 05/30/2018 4. Follow-up antiestrogen therapy --------------------------------------------------------------------  Antiestrogen therapy counseling: I recommended therapy with letrozole 2.5 mg daily x7 years We discussed the risks and benefits of anti-estrogen therapy with aromatase inhibitors. These include but not limited to insomnia, hot flashes, mood changes, vaginal dryness, bone density loss, and weight gain. We strongly believe that the benefits far outweigh the risks. Patient understands these risks and consented to starting treatment. Planned treatment duration is 7 years.  I also recommended participation in clinical trial Natalee NATALEE clinical trial: Phase 3 clinical trial in patients or estrogen receptor positive randomized between standard antiestrogen therapy versus  antiestrogen therapy with ribociclib 400 mg for 36 months.   Ribociclib: I discussed the risks and benefits of Ribociclib including myelosuppression especially neutropenia and with that risk of infection, there is risk of pulmonary embolism and mild peripheral neuropathy as well. Fatigue, nausea, diarrhea, decreased appetite as well as alopecia and thrombocytopenia are also potential side effects of Ribociclib.  Return to clinic next month to discuss clinical trial participation.   No orders of the defined types were placed in this encounter.  The patient has a good understanding of the overall plan. she agrees with it. she will call with any  problems that may develop before the next visit here.   Harriette Ohara, MD 06/02/18

## 2018-06-03 ENCOUNTER — Ambulatory Visit: Payer: BLUE CROSS/BLUE SHIELD | Attending: Radiation Oncology

## 2018-06-03 DIAGNOSIS — M25612 Stiffness of left shoulder, not elsewhere classified: Secondary | ICD-10-CM

## 2018-06-03 DIAGNOSIS — C50412 Malignant neoplasm of upper-outer quadrant of left female breast: Secondary | ICD-10-CM | POA: Diagnosis not present

## 2018-06-03 DIAGNOSIS — Z170 Estrogen receptor positive status [ER+]: Secondary | ICD-10-CM | POA: Insufficient documentation

## 2018-06-03 DIAGNOSIS — R293 Abnormal posture: Secondary | ICD-10-CM | POA: Insufficient documentation

## 2018-06-03 DIAGNOSIS — Z9189 Other specified personal risk factors, not elsewhere classified: Secondary | ICD-10-CM | POA: Insufficient documentation

## 2018-06-03 NOTE — Therapy (Addendum)
[place], [place], [address] Phone: [phone]   Fax:  [phone]  Physical Therapy Treatment  Patient Details  Name: Tonya Jackson MRN: [id number] Date of Birth: 02-08-1966 Referring Provider: Dr. Sondra Come   Encounter Date: 06/03/2018  PT End of Session - 06/03/18 1134    Visit Number  2    Number of Visits  6    Date for PT Re-Evaluation  06/26/18    PT Start Time  1022    PT Stop Time  1109    PT Time Calculation (min)  47 min    Activity Tolerance  Patient tolerated treatment well    Behavior During Therapy  [facility] for tasks assessed/performed       Past Medical History:  Diagnosis Date  . Dental bridge present    upper and lower  . GERD (gastroesophageal reflux disease)    occasional - TUMS as needed  . History of chemotherapy    finished chemo 02/05/2018  . History of left breast cancer 2018    Past Surgical History:  Procedure Laterality Date  . APPENDECTOMY  1982  . BREAST LUMPECTOMY WITH RADIOACTIVE SEED AND SENTINEL LYMPH NODE BIOPSY Left 03/13/2018   Procedure: LEFT BREAST LUMPECTOMY WITH RADIOACTIVE SEED AND LEFT RADIOACTIVE SEED TARGETED AXILLARY LYMPH NODE EXCISION AND LEFT SENTINEL LYMPH NODE BIOPSY;  Surgeon: Jovita Kussmaul, MD;  Location: [facility];  Service: General;  Laterality: Left;  . PORT-A-CATH REMOVAL Right 03/13/2018   Procedure: REMOVAL PORT-A-CATH;  Surgeon: Jovita Kussmaul, MD;  Location: [facility];  Service: General;  Laterality: Right;  . PORTACATH PLACEMENT Right 09/27/2017   Procedure: INSERTION PORT-A-CATH;  Surgeon: Jovita Kussmaul, MD;  Location: [facility];  Service: General;  Laterality: Right;  . WISDOM TOOTH EXTRACTION      There were no vitals filed for this visit.  Subjective Assessment - 06/03/18 1026    Subjective  My Lt arm is doing so much better. My cording is almost gone and I can lift my arm much  higher. I finished radiation yesterday. Overall my skin did well except reddened area over my collarbone.    Pertinent History  Lt lumpectomy and 6 lymph nodes removed 03/13/18, neoadjuvant chemo therapy completed, IDC with DCIS grade 3 ERpos/PRneg/Her2 positive, completing radiation    Patient Stated Goals  See if I need any therapy    Currently in Pain?  No/denies         [facility] PT Assessment - 06/03/18 0001      AROM   Left Shoulder Flexion  155 Degrees    Left Shoulder ABduction  175 Degrees   full ext with elbow now                  [facility] Adult PT Treatment/Exercise - 06/03/18 0001      Shoulder Exercises: Supine   Horizontal ABduction  Strengthening;Both;10 reps;Theraband    Theraband Level (Shoulder Horizontal ABduction)  Level 2 (Red)    External Rotation  Strengthening;Both;10 reps;Theraband    Theraband Level (Shoulder External Rotation)  Level 2 (Red)    Flexion  Strengthening;Both;10 reps;Theraband   Narrow and Wide Grip, 10 times each   Theraband Level (Shoulder Flexion)  Level 2 (Red)    Diagonals  Strengthening;Right;Left;10 reps;Theraband    Theraband Level (Shoulder Diagonals)  Level 2 (Red)      Shoulder Exercises: Pulleys   Flexion  2 minutes  Flexion Limitations  Tactile cues to decrease Lt scapular compensation    ABduction  1 minute             PT Education - 06/03/18 1045    Education Details  Supine scapular series, lymphedema risk reduction and infection prevention, and issued script with info to call [facility]    Person(s) Educated  Patient    Methods  Explanation;Demonstration;Handout    Comprehension  Verbalized understanding;Returned demonstration;Need further instruction           PT Long Term Goals - 06/03/18 1702      PT LONG TERM GOAL #1   Title  Pt will improve demonstrate no cording at the elbow with active flexion and abduction     Status  Achieved      PT LONG TERM GOAL #2   Title  Pt will have an  understanding of lymphedema precautions and indications    Status  Achieved      PT LONG TERM GOAL #3   Title  Pt will be independent with HEP for continued shoulder mobility and strength     Status  Achieved              Plan - 06/03/18 1135    Clinical Impression Statement  Pt has made excellent progress since evaluation 2 weeks ago and has now completed radiation and feels she can cont her progress at home. Issued supine scapular series, reviewed lymphedema risk reduction and infection prevention and issued script for compression garment with info to call [facility]. Her A/ROM has greatly imroved with abduction and her cording is no longer visibile at her antecubitla fossa, and minimally at her axilla. Pt is ready for D/C    Rehab Potential  Excellent    PT Frequency  1x / week    PT Duration  6 weeks    PT Treatment/Interventions  ADLs/Self Care Home Management;DME Instruction;Therapeutic exercise;Patient/family education;Manual techniques;Taping    PT Next Visit Plan  D/C this visit.    Consulted and Agree with Plan of Care  Patient       Patient will benefit from skilled therapeutic intervention in order to improve the following deficits and impairments:  Decreased knowledge of precautions, Decreased range of motion, Decreased knowledge of use of DME, Increased fascial restricitons  Visit Diagnosis: Stiffness of left shoulder, not elsewhere classified  Malignant neoplasm of upper-outer quadrant of left breast in female, estrogen receptor positive ([place])  At risk for lymphedema  Abnormal posture     Problem List Patient Active Problem List   Diagnosis Date Noted  . Port-A-Cath in place 10/16/2017  . Malignant neoplasm of upper-outer quadrant of left breast in female, estrogen receptor positive ([place]) 09/03/2017    Otelia Limes, PTA 06/03/2018, 2:15 PM  [place], [place], [address] Phone: [phone]   Fax:  [phone]  Name: Tonya Jackson MRN: [id number] Date of Birth: 12-Jul-1966  PHYSICAL THERAPY DISCHARGE SUMMARY  Visits from Start of Care: 2  Current functional level related to goals / functional outcomes: See above   Remaining deficits: See above   Education / Equipment: HEP, lymphedema, how to get compression Plan: Patient agrees to discharge.  Patient goals were met. Patient is being discharged due to meeting the stated rehab goals.  ?????    Shan Levans, PT

## 2018-06-03 NOTE — Patient Instructions (Signed)

## 2018-06-04 ENCOUNTER — Telehealth: Payer: Self-pay | Admitting: Hematology and Oncology

## 2018-06-04 ENCOUNTER — Encounter: Payer: Self-pay | Admitting: Radiation Oncology

## 2018-06-04 IMAGING — CT CT ABD-PELV W/ CM
3 of 5 series · 14 of 36 positions shown, 17 images · IV contrast (ISOVUE 300)
Comparison: None.

CLINICAL DATA: LEFT breast cancer diagnosed August 2017.

EXAM:
CT CHEST, ABDOMEN, AND PELVIS WITH CONTRAST
TECHNIQUE: Multidetector CT imaging of the chest, abdomen and pelvis was
performed following the standard protocol during bolus
administration of intravenous contrast.
CONTRAST:  100mL RHUHCC-333 IOPAMIDOL (RHUHCC-333) INJECTION 61%

[Series 2: cap with · axial · 0.80mm/px · z∈[+923,+1453]mm · 9 of 134 slices shown, 12 images]
[im 14/134  mediastinal]
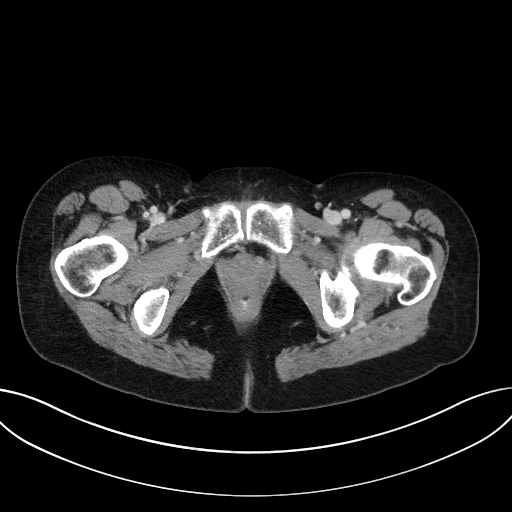
[im 14/134  lung]
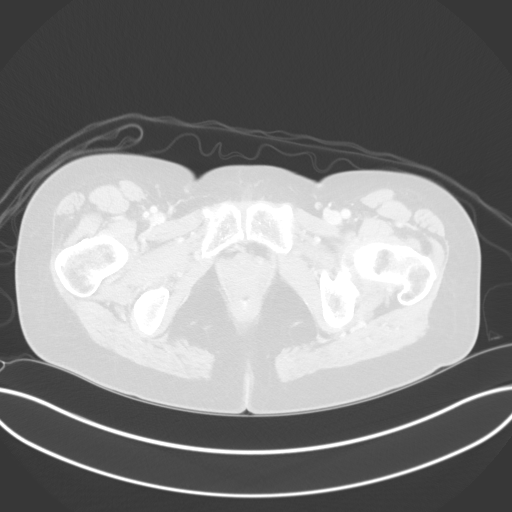
[im 27/134  lung]
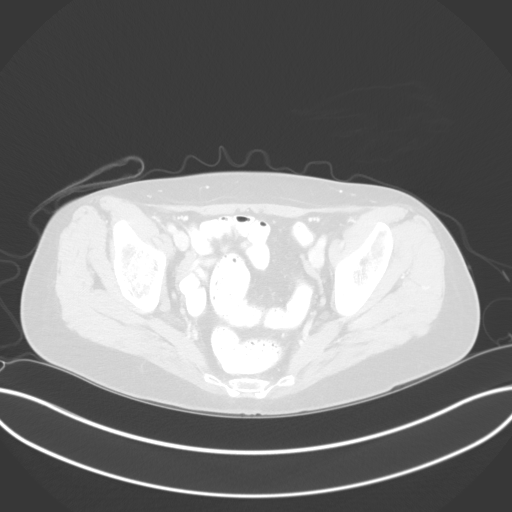
[im 40/134  lung]
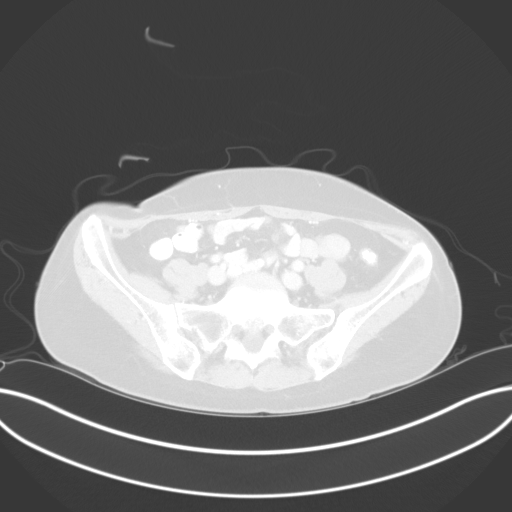
[im 54/134  lung]
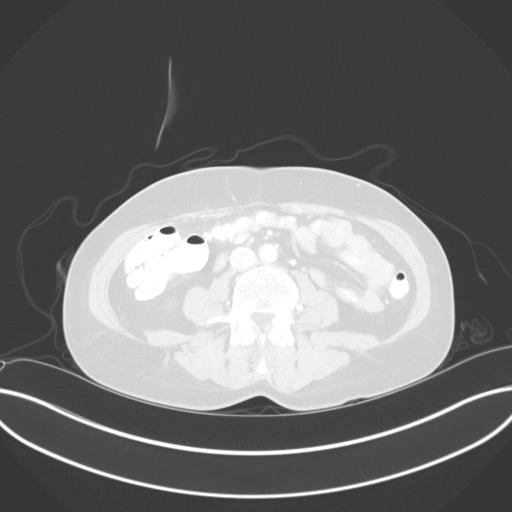
[im 67/134  mediastinal]
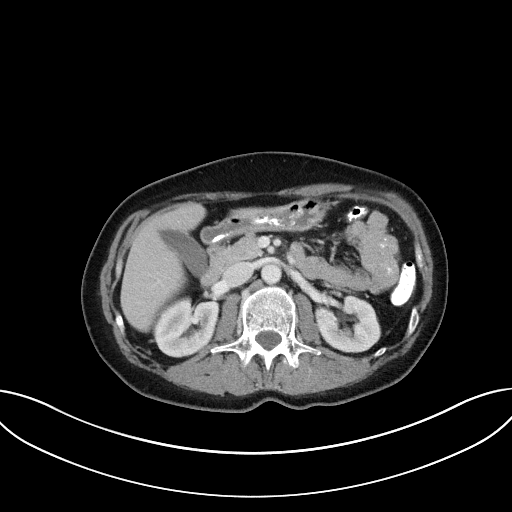
[im 67/134  lung]
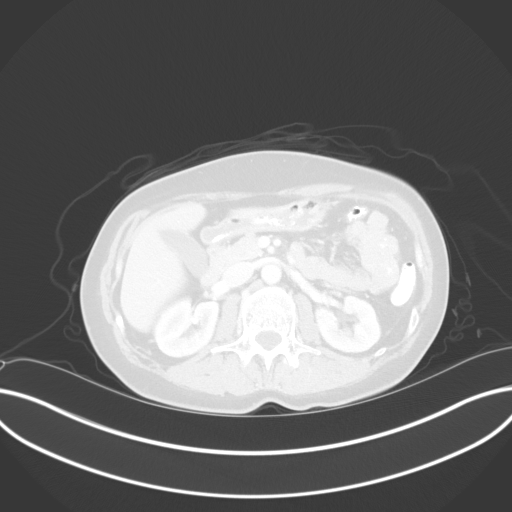
[im 80/134  lung]
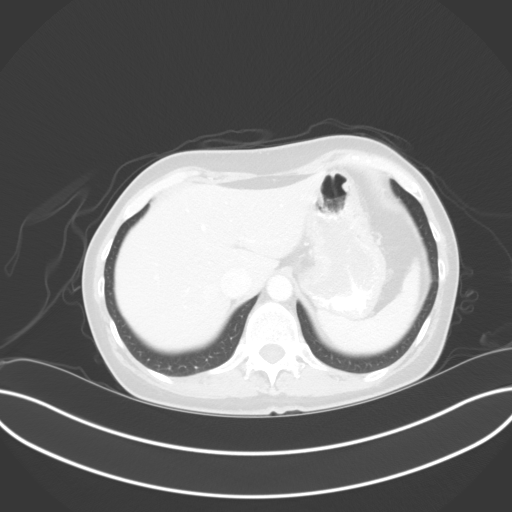
[im 94/134  lung]
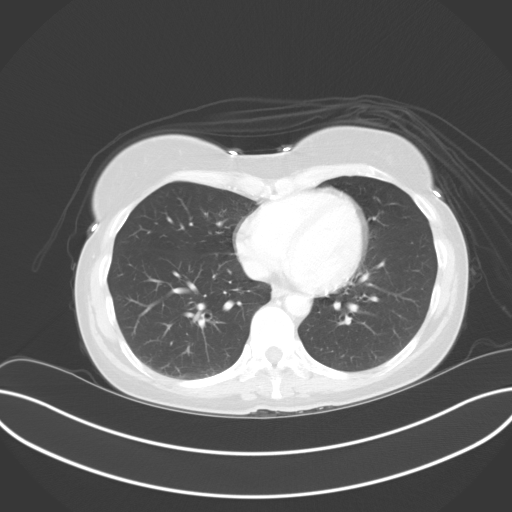
[im 107/134  lung]
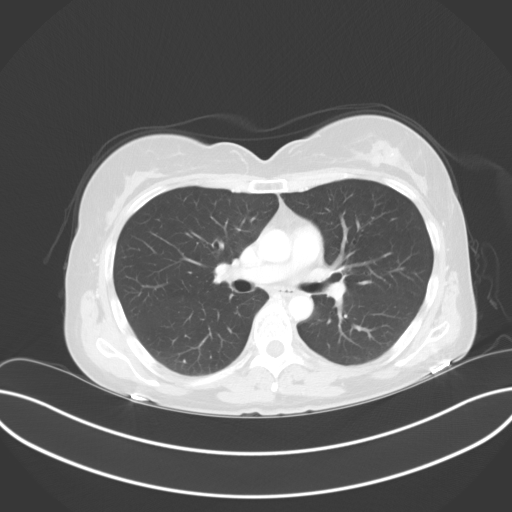
[im 120/134  mediastinal]
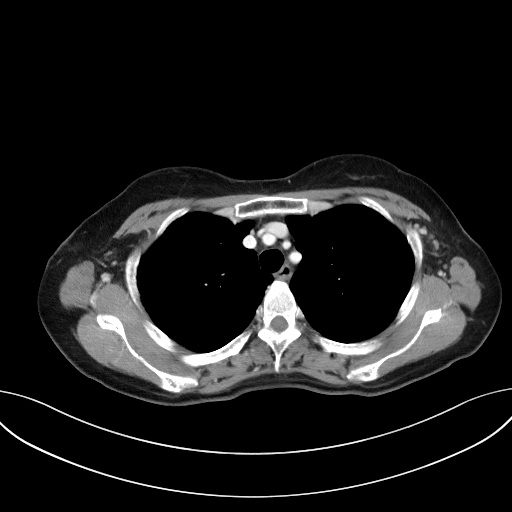
[im 120/134  lung]
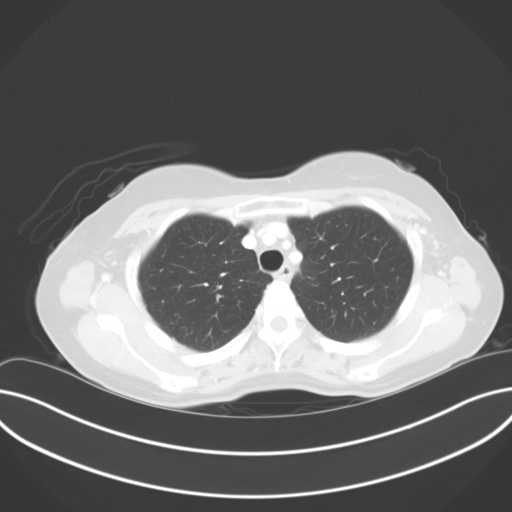

[Series 4: lung · axial · 0.80mm/px · z∈[+1237,+1285]mm · 2 of 155 slices shown]
[im 12/155  lung]
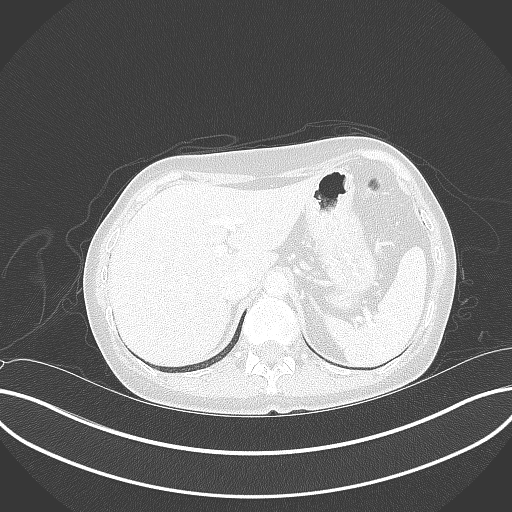
[im 36/155  lung]
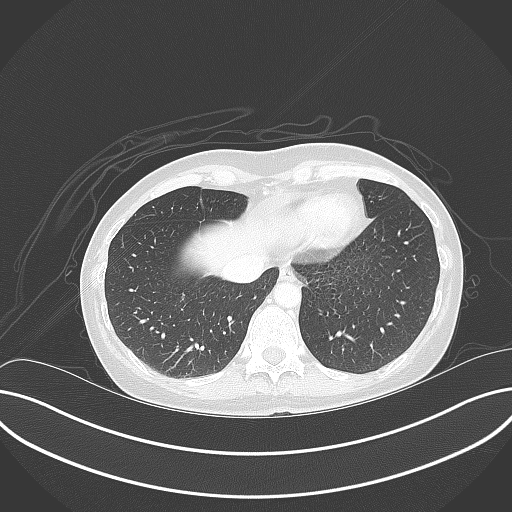

[Series 5: coronals · coronal · 0.84mm/px · 3 of 124 slices shown]
[im 25/124  lung]
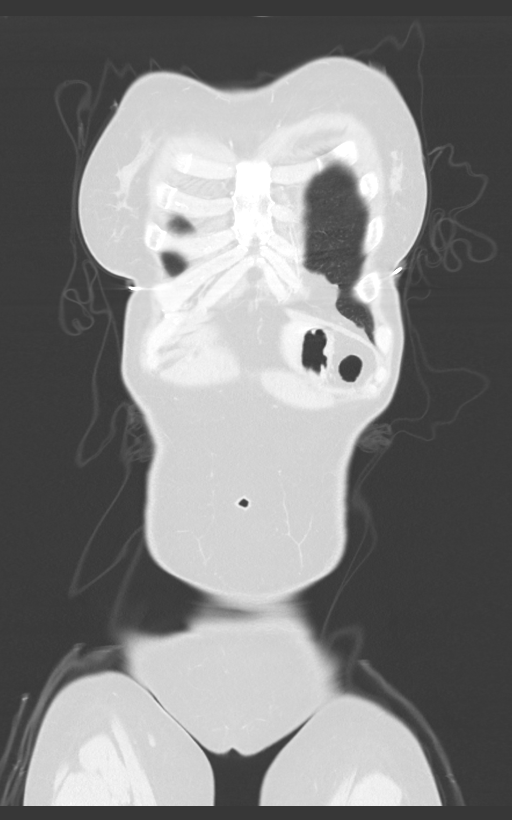
[im 50/124  lung]
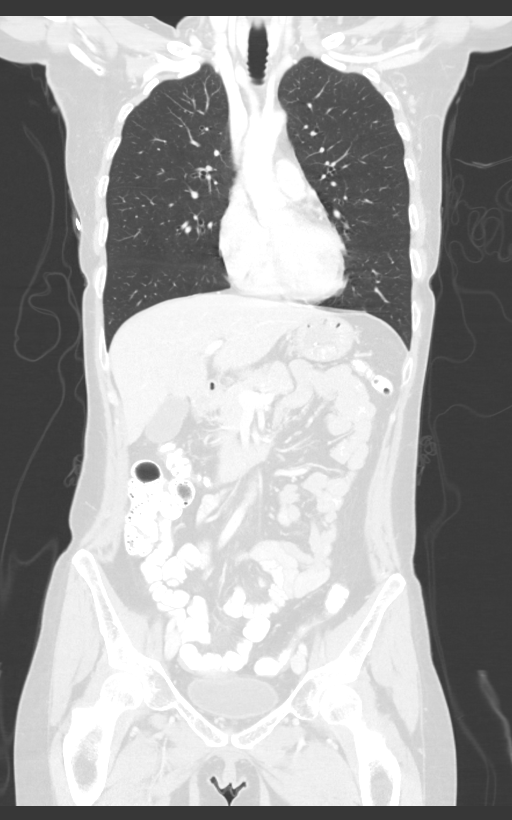
[im 74/124  lung]
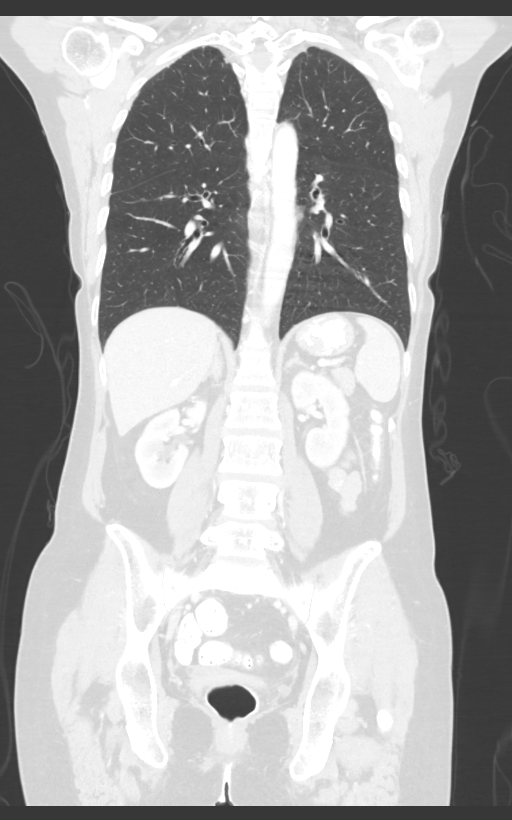

[14 of 36 positions shown; findings below may reference images not displayed]

FINDINGS: CT CHEST FINDINGS

Cardiovascular: No significant vascular findings. Normal heart size.
No pericardial effusion.

Mediastinum/Nodes: No axillary or supraclavicular adenopathy. No
internal mammary adenopathy. No mediastinal hilar adenopathy.

No pericardial fluid.  Esophagus normal

Lungs/Pleura: Small 4 mm cavitary nodule in the RIGHT lower lobe
(image 87, series 4).

Musculoskeletal: No aggressive osseous lesion.

CT ABDOMEN AND PELVIS FINDINGS

Hepatobiliary: Tiny hypodense lesions in the dome liver measuring 3
mm (image 51, series 2) Fatty infiltration along the falciform
ligament. No duct dilatation. Gallbladder normal.

Pancreas: Pancreas is normal. No ductal dilatation. No pancreatic
inflammation.

Spleen: Normal spleen

Adrenals/urinary tract: Adrenal glands and kidneys are normal. The
ureters and bladder normal.

Stomach/Bowel: Stomach, small bowel, appendix, and cecum are normal.
The colon and rectosigmoid colon are normal.

Vascular/Lymphatic: Abdominal aorta is normal caliber. There is no
retroperitoneal or periportal lymphadenopathy. No pelvic
lymphadenopathy.

Reproductive: Uterus is normal.  Adnexa normal.

Other: No free-fluid. No peritoneal nodularity. No omental
nodularity

Musculoskeletal: No aggressive osseous lesion.
IMPRESSION: Chest Impression:

1. No evidence breast cancer metastasis to the lymph nodes of the
thorax.
2. Small cavitary nodule in the RIGHT lower lobe is favored benign.
Recommend attention on follow-up.

Abdomen / Pelvis Impression:

1. No evidence of metastatic disease in the abdomen pelvis.
2. No skeletal metastasis.

## 2018-06-04 NOTE — Telephone Encounter (Signed)
Mailed patient calendar of upcoming sept appts.

## 2018-06-04 NOTE — Progress Notes (Signed)
  Radiation Oncology         [phone] ________________________________  Name: Tonya Jackson MRN: [id number]  Date: 06/04/2018  DOB: 1965/11/27  End of Treatment Note  Diagnosis:   Stage IIIA Left Breast UOQ Invasive Ductal Carcinoma with DCIS, ER+, PR-, Her2-, Grade 3    Cancer Staging Malignant neoplasm of upper-outer quadrant of left breast in female, estrogen receptor positive ([place]) Staging form: Breast, AJCC 8th Edition - Clinical stage from 09/04/2017: Stage IIIA (cT2, cN1, cM0, G3, ER+, PR-, HER2-) - Unsigned Staging comments: Staged at breast conference on 11.14.18  ypT1b, ypN0  Indication for treatment:  Curative       Radiation treatment dates:  04/21/18 - 06/02/18  Site/dose:    1. Breast, Left/ total of 50 Gy in 25 fractions 2. Breast, Left, regional nodes/ total of 50 Gy in 25 fractions 3. Breast, Left, Boost/ total of 10 Gy in 5 fractions   Beams/energy:    1. 3D, Photon/ 6X, 10X 2. 3D, Photon/ 10X, 6X 3. Electron/ 12E, 9E   Narrative: The patient tolerated radiation treatment relatively well. She denied pain throughout, and noted increasing fatigue as treatments went on. Towards the end of treatment, she reported hyperpigmentation, redness over the clavicle, and itching around the breast.   Plan: The patient has completed radiation treatment. The patient will return to radiation oncology clinic for routine followup in one month. I advised them to call or return sooner if they have any questions or concerns related to their recovery or treatment.  -----------------------------------  Eppie Gibson, MD  This document serves as a record of services personally performed by Eppie Gibson, MD. It was created on his behalf by Wilburn Mylar, a trained medical scribe. The creation of this record is based on the scribe's personal observations and the provider's statements to them. This document has been checked and approved by the attending provider.

## 2018-06-14 IMAGING — CR DG CHEST 1V PORT
1 series · 1 of 1 positions shown · non-contrast
Comparison: CT chest 09/17/2017

CLINICAL DATA: Port-A-Cath placement

EXAM:
PORTABLE CHEST 1 VIEW

[portable]
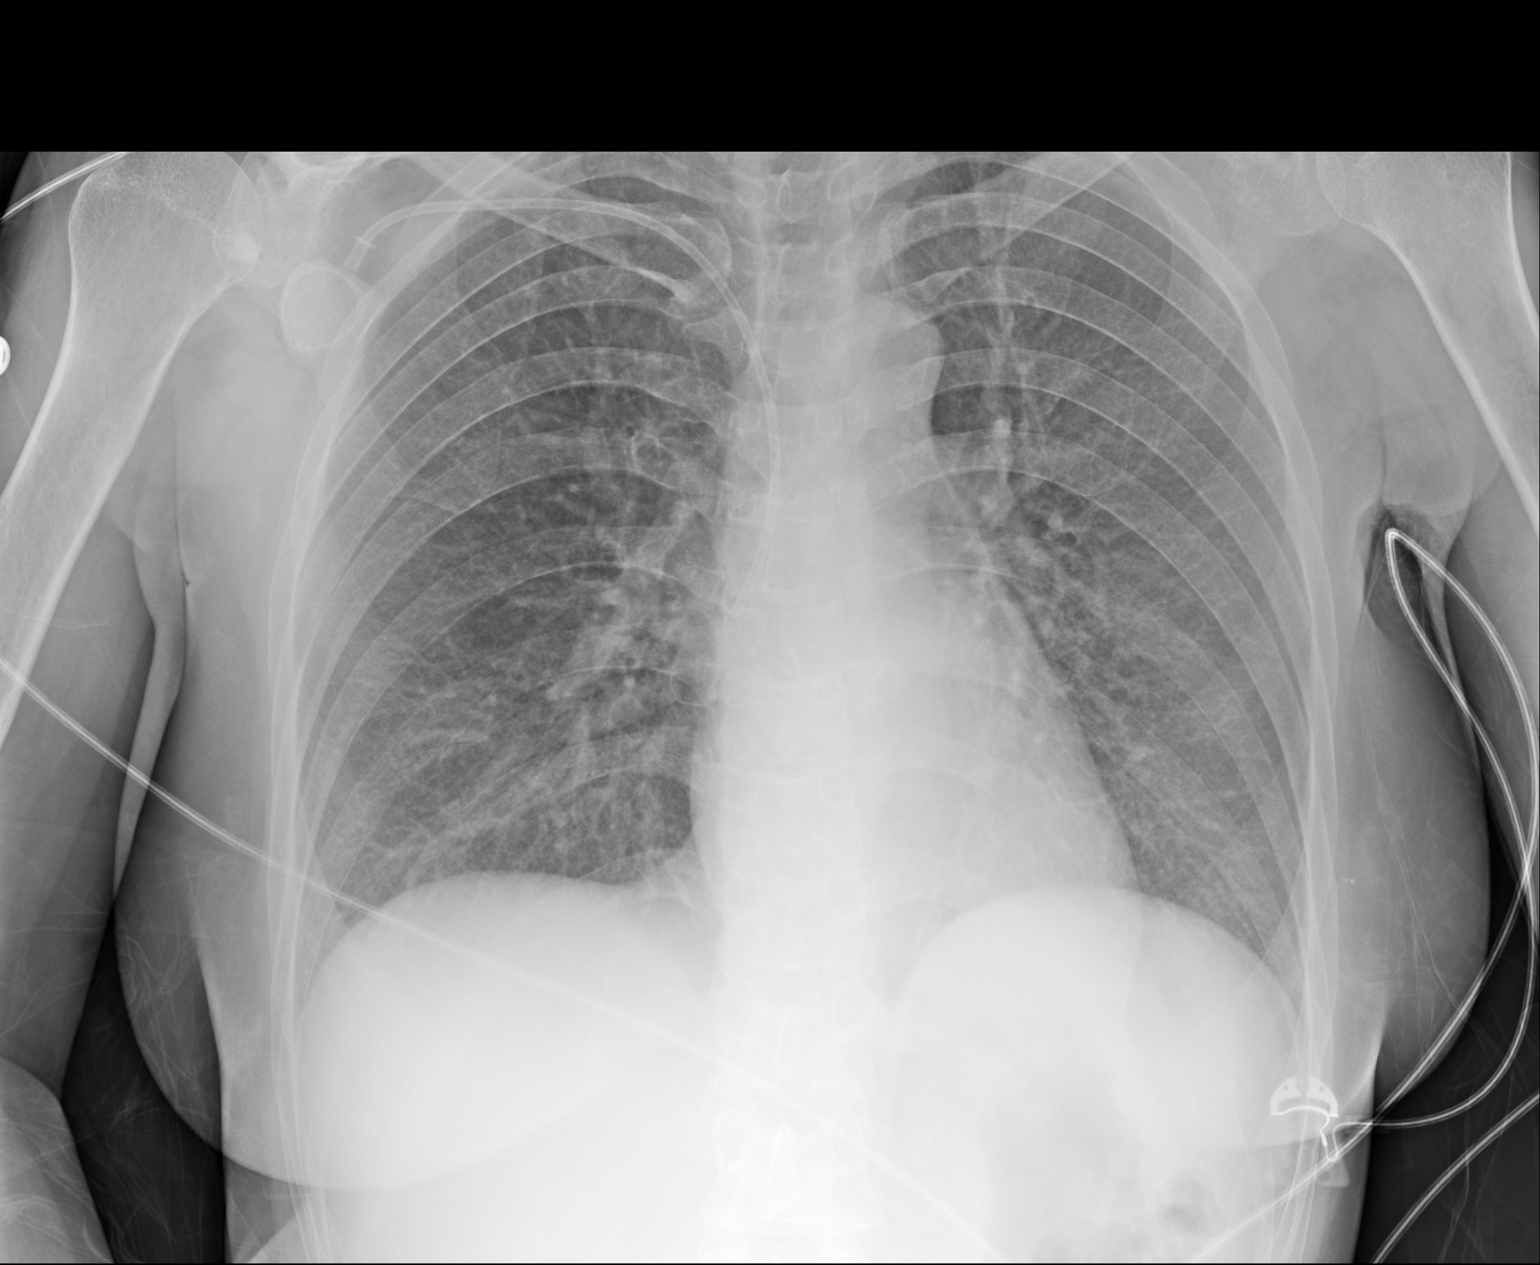

[1 of 1 positions shown; findings below may reference images not displayed]

FINDINGS: Right subclavian Port-A-Cath tip in the SVC just above the
cavoatrial junction. No pneumothorax

Heart size and mediastinal contours normal.  Lungs are clear.
IMPRESSION: Port-A-Cath placement lower SVC.  Lungs clear

## 2018-06-26 ENCOUNTER — Encounter: Payer: Self-pay | Admitting: Radiation Oncology

## 2018-06-26 NOTE — Progress Notes (Signed)
Tonya Jackson presents for follow up of radiation completed 06/02/18 to her Left Breast. She saw Dr. Lindi Adie on 06/02/18 and began Letrozole 2.5 mg daily. She will see him again on 07/07/18 to discuss NATALEE clinical trial. Patient states that she has sharp shooting pains in her breast.. States that she has mild fatigue. States that her skin is hyperpigmented.States that she is currently using her radiaplex cream.Patients blood pressure was low denied any s/s of hypotension. Vitals:   07/04/18 1519  BP: 94/64  Pulse: 98  Resp: 18  Temp: 98.2 F (36.8 C)  TempSrc: Oral  SpO2: 97%  Weight: 136 lb 12.8 oz (62.1 kg)   Wt Readings from Last 3 Encounters:  07/04/18 136 lb 12.8 oz (62.1 kg)  06/02/18 136 lb 9.6 oz (62 kg)  03/28/18 133 lb 3.2 oz (60.4 kg)

## 2018-07-04 ENCOUNTER — Encounter: Payer: Self-pay | Admitting: Radiation Oncology

## 2018-07-04 ENCOUNTER — Other Ambulatory Visit: Payer: Self-pay

## 2018-07-04 ENCOUNTER — Ambulatory Visit
Admission: RE | Admit: 2018-07-04 | Discharge: 2018-07-04 | Disposition: A | Discharge status: Home / Self Care | Payer: BLUE CROSS/BLUE SHIELD | Source: Ambulatory Visit | Attending: Radiation Oncology | Admitting: Radiation Oncology

## 2018-07-04 VITALS — BP 94/64 | HR 98 | Temp 98.2°F | Resp 18 | Wt 136.8 lb

## 2018-07-04 DIAGNOSIS — C50919 Malignant neoplasm of unspecified site of unspecified female breast: Secondary | ICD-10-CM | POA: Diagnosis not present

## 2018-07-04 DIAGNOSIS — C50412 Malignant neoplasm of upper-outer quadrant of left female breast: Secondary | ICD-10-CM | POA: Insufficient documentation

## 2018-07-04 DIAGNOSIS — Z01419 Encounter for gynecological examination (general) (routine) without abnormal findings: Secondary | ICD-10-CM | POA: Diagnosis not present

## 2018-07-04 DIAGNOSIS — Z124 Encounter for screening for malignant neoplasm of cervix: Secondary | ICD-10-CM | POA: Diagnosis not present

## 2018-07-04 DIAGNOSIS — Z170 Estrogen receptor positive status [ER+]: Secondary | ICD-10-CM | POA: Insufficient documentation

## 2018-07-04 DIAGNOSIS — Z6822 Body mass index (BMI) 22.0-22.9, adult: Secondary | ICD-10-CM | POA: Diagnosis not present

## 2018-07-04 DIAGNOSIS — E049 Nontoxic goiter, unspecified: Secondary | ICD-10-CM | POA: Diagnosis not present

## 2018-07-04 HISTORY — DX: Personal history of irradiation: Z92.3

## 2018-07-04 NOTE — Progress Notes (Signed)
Radiation Oncology         [phone] ________________________________  Name: Tonya Jackson MRN: [id number]  Date: 07/04/2018  DOB: [birth year]  Follow-Up Visit Note  Outpatient  CC: [name], MD  Nicholas Lose, MD  Diagnosis and Prior Radiotherapy:    ICD-10-CM   1. Malignant neoplasm of upper-outer quadrant of left breast in female, estrogen receptor positive ([place]) C50.412    Z17.0    Stage IIIA Left Breast UOQ Invasive Ductal Carcinoma with DCIS, ER+, PR-, Her2-, Grade 3    Cancer Staging Malignant neoplasm of upper-outer quadrant of left breast in female, estrogen receptor positive ([place]) Staging form: Breast, AJCC 8th Edition - Clinical stage from 09/04/2017: Stage IIIA (cT2, cN1, cM0, G3, ER+, PR-, HER2-) - Unsigned Staging comments: Staged at breast conference on 11.14.18  ypT1b, ypN0  04/21/2018 to 06/02/2018:  1. Breast, Left/ total of 50 Gy in 25 fractions 2. Breast, Left, regional nodes/ total of 50 Gy in 25 fractions 3. Breast, Left, Boost/ total of 10 Gy in 5 fractions   CHIEF COMPLAINT: Here for follow-up and surveillance of left breast cancer  Narrative:  The patient returns today for routine follow-up of radiation completed 06/02/2018 to her left breast. She was seen by Dr. Lindi Adie on 06/02/2018 and prescribed Letrozole 2.5 mg daily. She will see him again on 07/07/2018 to discuss NATALEE clinical trial. She is healing well, using radiaplex on her skin.       She states her GYN thought her thyroid was full so lab tests were ordered this week.                        ALLERGIES:  is allergic to adhesive [tape].  Meds: Current Outpatient Medications  Medication Sig Dispense Refill  . letrozole (FEMARA) 2.5 MG tablet Take 1 tablet (2.5 mg total) by mouth daily. 90 tablet 3  . acetaminophen (TYLENOL) 325 MG tablet Take 650 mg by mouth every 6 (six) hours as needed.    Marland Kitchen HYDROcodone-acetaminophen (NORCO/VICODIN) 5-325 MG tablet Take 1-2 tablets by mouth every 6  (six) hours as needed for moderate pain or severe pain. (Patient not taking: Reported on 06/03/2018) 15 tablet 0   No current facility-administered medications for this encounter.     Physical Findings: The patient is in no acute distress. Patient is alert and oriented.  weight is 136 lb 12.8 oz (62.1 kg). Her oral temperature is 98.2 F (36.8 C). Her blood pressure is 94/64 and her pulse is 98. Her respiration is 18 and oxygen saturation is 97%. .    Satisfactory skin healing in radiotherapy fields. Slight fullness, symmetric, thyroid gland, without masses.  Left breast skin is smooth with mild residual hyperpigmentation.  Lab Findings: Lab Results  Component Value Date   WBC 2.6 (L) 02/12/2018   HGB 10.5 (L) 02/12/2018   HCT 31.1 (L) 02/12/2018   MCV 94.1 02/12/2018   PLT 195 02/12/2018    Radiographic Findings: No results found.  Impression/Plan: Healing well from radiotherapy to the breast tissue.  Continue skin care with topical Vitamin E Oil and / or lotion for at least 2 more months for further healing.  Encouraged patient to have her GYN call me if thyroid tests are abnormal.  It is possible she has mild thyroiditis from the SCV nodal beam skimming the edge of the left gland.  I encouraged her to continue with yearly mammography  and followup with medical oncology. I will see  her back on an as-needed basis. I have encouraged her to call if she has any issues or concerns in the future. I wished her the very best.    _____________________________________   Eppie Gibson, MD   This document serves as a record of services personally performed by Eppie Gibson, MD. It was created on her behalf by Arlyce Harman, a trained medical scribe. The creation of this record is based on the scribe's personal observations and the provider's statements to them. This document has been checked and approved by the attending provider.

## 2018-07-06 NOTE — Assessment & Plan Note (Signed)
08/29/2017: Screening detected left breast mass 4 cm at 12 o'clock position, 1.4 cm lymph node the axilla, biopsy of the mass and the lymph nodes were positive for IDC with DCIS grade 3 ER 70% PR 0%, KI 2%, HER-2 negative ratio 1.77, T2N1 stage IIIa clinical stage AJCC 8  Mammaprinthigh risk luminal type B.   03/13/2018:Left lumpectomy: IDC with DCIS grade 3, 0.6 cm, 0/6 lymph nodes negative, margins negative, ER 70%, PR 0%, HER-2 negative ratio 1.77, RCB class II ypT1b, ypN0  Treatment summary: 1.Neoadjuvant chemotherapy with dose dense Adriamycin and Cytoxan followed by Taxol x12completed 02/12/2018 2. Followed by breast conserving surgery and targeted lymph node dissection on 03/13/2018 3. Followed by radiation 04/21/2018 to 05/30/2018 4. Antiestrogen therapy with Letrozole 2.5 mg daily X 7 years started 06/02/18 NATALEE clinical trial -------------------------------------------------------------------- Patient signed consent for the clinical trial NATALEE

## 2018-07-07 ENCOUNTER — Other Ambulatory Visit: Payer: Self-pay | Admitting: Obstetrics and Gynecology

## 2018-07-07 ENCOUNTER — Inpatient Hospital Stay: Payer: BLUE CROSS/BLUE SHIELD | Attending: Hematology and Oncology | Admitting: Hematology and Oncology

## 2018-07-07 ENCOUNTER — Telehealth: Payer: Self-pay | Admitting: Hematology and Oncology

## 2018-07-07 DIAGNOSIS — C50412 Malignant neoplasm of upper-outer quadrant of left female breast: Secondary | ICD-10-CM | POA: Diagnosis not present

## 2018-07-07 DIAGNOSIS — Z9221 Personal history of antineoplastic chemotherapy: Secondary | ICD-10-CM | POA: Diagnosis not present

## 2018-07-07 DIAGNOSIS — Z79811 Long term (current) use of aromatase inhibitors: Secondary | ICD-10-CM | POA: Insufficient documentation

## 2018-07-07 DIAGNOSIS — L598 Other specified disorders of the skin and subcutaneous tissue related to radiation: Secondary | ICD-10-CM | POA: Insufficient documentation

## 2018-07-07 DIAGNOSIS — E049 Nontoxic goiter, unspecified: Secondary | ICD-10-CM

## 2018-07-07 DIAGNOSIS — Z170 Estrogen receptor positive status [ER+]: Secondary | ICD-10-CM | POA: Diagnosis not present

## 2018-07-07 DIAGNOSIS — Z923 Personal history of irradiation: Secondary | ICD-10-CM | POA: Insufficient documentation

## 2018-07-07 NOTE — Progress Notes (Signed)
Patient Care Team: Tonya Austin, MD as PCP - General (Family Medicine)  DIAGNOSIS:  Encounter Diagnosis  Name Primary?  . Malignant neoplasm of upper-outer quadrant of left breast in female, estrogen receptor positive ([place])     SUMMARY OF ONCOLOGIC HISTORY:   Malignant neoplasm of upper-outer quadrant of left breast in female, estrogen receptor positive ([place])   08/29/2017 Initial Diagnosis    Screening detected left breast mass 4 cm at 12 o'clock position, 1.4 cm lymph node the axilla, biopsy of the mass and the lymph nodes were positive for IDC with DCIS grade 3 ER 70% PR 0%, KI 2%, HER-2 negative ratio 1.77, T2N1 stage IIIa clinical stage AJCC 8    09/11/2017 Breast MRI    Left breast malignancy with associated linear NME extending posteriorly as well as satellite nodules measures up to 5.3 cm, at least 4 abnormal left axillary lymph nodes    09/11/2017 Imaging    CT CAP and Bone scan negative for metastatic disease    09/18/2017 Miscellaneous    Mammaprint high risk luminal type B.  Average 10-year risk of recurrence untreated 29%; with chemotherapy and hormonal therapy distant metastasis free interval at 5 years 94.6%    10/02/2017 - 02/12/2018 Neo-Adjuvant Chemotherapy    Neoadjuvant chemotherapy with dose dense Adriamycin and Cytoxan x4 followed by Taxol weekly x12    02/14/2018 Breast MRI    Near complete imaging response of the known cancer in the left breast neoadjuvant chemotherapy. There is a 2 mm residual focus of enhancement approximately 1.2 cm inferior and medial to the biopsy marking clip.     03/13/2018 Surgery    Left lumpectomy: IDC with DCIS grade 3, 0.6 cm, 0/6 lymph nodes negative, margins negative, ER 70%, PR 0%, HER-2 negative ratio 1.77, RCB class II ypT1b, ypN0    04/21/2018 - 06/02/2018 Radiation Therapy    Adj XRT     CHIEF COMPLIANT: Follow-up towards end of adjuvant radiation therapy INTERVAL HISTORY: Tonya Jackson is a 52 year old with  above-mentioned history of left breast cancer treated with neoadjuvant chemotherapy followed by left lumpectomy and is about to finish her adjuvant radiation therapy.  She finishes radiation by next Monday.  She does have radiation dermatitis.  However she is able to tolerate it reasonably well.  REVIEW OF SYSTEMS:   Constitutional: Denies fevers, chills or abnormal weight loss Eyes: Denies blurriness of vision Ears, nose, mouth, throat, and face: Denies mucositis or sore throat Respiratory: Denies cough, dyspnea or wheezes Cardiovascular: Denies palpitation, chest discomfort Gastrointestinal:  Denies nausea, heartburn or change in bowel habits Skin: Denies abnormal skin rashes Lymphatics: Denies new lymphadenopathy or easy bruising Neurological:Denies numbness, tingling or new weaknesses Behavioral/Psych: Mood is stable, no new changes  Extremities: No lower extremity edema   All other systems were reviewed with the patient and are negative.  I have reviewed the past medical history, past surgical history, social history and family history with the patient and they are unchanged from previous note.  ALLERGIES:  is allergic to adhesive [tape].  MEDICATIONS:  Current Outpatient Medications  Medication Sig Dispense Refill  . acetaminophen (TYLENOL) 325 MG tablet Take 650 mg by mouth every 6 (six) hours as needed.    Marland Kitchen HYDROcodone-acetaminophen (NORCO/VICODIN) 5-325 MG tablet Take 1-2 tablets by mouth every 6 (six) hours as needed for moderate pain or severe pain. (Patient not taking: Reported on 06/03/2018) 15 tablet 0  . letrozole (FEMARA) 2.5 MG tablet Take 1 tablet (2.5 mg total)  by mouth daily. 90 tablet 3   No current facility-administered medications for this visit.     PHYSICAL EXAMINATION: ECOG PERFORMANCE STATUS: 1 - Symptomatic but completely ambulatory  There were no vitals filed for this visit. There were no vitals filed for this visit.  GENERAL:alert, no distress and  comfortable SKIN: skin color, texture, turgor are normal, no rashes or significant lesions EYES: normal, Conjunctiva are pink and non-injected, sclera clear OROPHARYNX:no exudate, no erythema and lips, buccal mucosa, and tongue normal  NECK: supple, thyroid normal size, non-tender, without nodularity LYMPH:  no palpable lymphadenopathy in the cervical, axillary or inguinal LUNGS: clear to auscultation and percussion with normal breathing effort HEART: regular rate & rhythm and no murmurs and no lower extremity edema ABDOMEN:abdomen soft, non-tender and normal bowel sounds MUSCULOSKELETAL:no cyanosis of digits and no clubbing  NEURO: alert & oriented x 3 with fluent speech, no focal motor/sensory deficits EXTREMITIES: No lower extremity edema BREAST: Radiation dermatitis (exam performed in the presence of a chaperone)  LABORATORY DATA:  I have reviewed the data as listed CMP Latest Ref Rng & Units 02/12/2018 02/05/2018 01/29/2018  Glucose 70 - 140 mg/dL 85 78 88  BUN 7 - 26 mg/dL _0 Creatinine 0.60 - 1.10 mg/dL 0.65 0.63 0.65  Sodium 136 - 145 mmol/L 140 139 141  Potassium 3.5 - 5.1 mmol/L 3.9 3.9 4.0  Chloride 98 - 109 mmol/L 106 105 104  CO2 22 - 29 mmol/L 24 30(H) 29  Calcium 8.4 - 10.4 mg/dL 9.2 9.3 9.3  Total Protein 6.4 - 8.3 g/dL 6.5 6.7 6.5  Total Bilirubin 0.2 - 1.2 mg/dL 0.5 0.5 0.4  Alkaline Phos 40 - 150 U/L 59 67 66  AST 5 - 34 U/L _1 ALT 0 - 55 U/L 25 31 68(H)    Lab Results  Component Value Date   WBC 2.6 (L) 02/12/2018   HGB 10.5 (L) 02/12/2018   HCT 31.1 (L) 02/12/2018   MCV 94.1 02/12/2018   PLT 195 02/12/2018   NEUTROABS 1.7 02/12/2018    ASSESSMENT & PLAN:  Malignant neoplasm of upper-outer quadrant of left breast in female, estrogen receptor positive ([place]) 08/29/2017: Screening detected left breast mass 4 cm at 12 o'clock position, 1.4 cm lymph node the axilla, biopsy of the mass and the lymph nodes were positive for IDC with DCIS grade 3 ER 70%  PR 0%, KI 2%, HER-2 negative ratio 1.77, T2N1 stage IIIa clinical stage AJCC 8  Mammaprinthigh risk luminal type B.   03/13/2018:Left lumpectomy: IDC with DCIS grade 3, 0.6 cm, 0/6 lymph nodes negative, margins negative, ER 70%, PR 0%, HER-2 negative ratio 1.77, RCB class II ypT1b, ypN0  Treatment summary: 1.Neoadjuvant chemotherapy with dose dense Adriamycin and Cytoxan followed by Taxol x12completed 02/12/2018 2. Followed by breast conserving surgery and targeted lymph node dissection on 03/13/2018 3. Followed by radiation 04/21/2018 to 05/30/2018 4. Antiestrogen therapy with Letrozole 2.5 mg daily X 7 years started 07/07/2018   -------------------------------------------------------------------- Patient will return back to see [place] in 3 months for survivorship care plan visit Patient refused participation in [place] clinical trial.   No orders of the defined types were placed in this encounter.  The patient has a good understanding of the overall plan. she agrees with it. she will call with any problems that may develop before the next visit here.   Tonya Ohara, MD 07/07/18

## 2018-07-07 NOTE — Telephone Encounter (Signed)
Gave avs and calendar ° °

## 2018-07-08 ENCOUNTER — Ambulatory Visit
Admission: RE | Admit: 2018-07-08 | Discharge: 2018-07-08 | Disposition: A | Discharge status: Home / Self Care | Payer: BLUE CROSS/BLUE SHIELD | Source: Ambulatory Visit | Attending: Obstetrics and Gynecology | Admitting: Obstetrics and Gynecology

## 2018-07-08 ENCOUNTER — Encounter: Payer: Self-pay | Admitting: *Deleted

## 2018-07-08 ENCOUNTER — Telehealth: Payer: Self-pay | Admitting: Hematology and Oncology

## 2018-07-08 DIAGNOSIS — E049 Nontoxic goiter, unspecified: Secondary | ICD-10-CM

## 2018-07-08 NOTE — Telephone Encounter (Signed)
Mailed pt calendar of upcoming appts per 9/17 sch message

## 2018-07-10 DIAGNOSIS — Z1211 Encounter for screening for malignant neoplasm of colon: Secondary | ICD-10-CM | POA: Diagnosis not present

## 2018-07-10 DIAGNOSIS — D649 Anemia, unspecified: Secondary | ICD-10-CM | POA: Diagnosis not present

## 2018-07-28 DIAGNOSIS — E039 Hypothyroidism, unspecified: Secondary | ICD-10-CM | POA: Diagnosis not present

## 2018-07-28 DIAGNOSIS — E010 Iodine-deficiency related diffuse (endemic) goiter: Secondary | ICD-10-CM | POA: Diagnosis not present

## 2018-08-18 ENCOUNTER — Encounter: Payer: Self-pay | Admitting: Hematology and Oncology

## 2018-08-18 DIAGNOSIS — D125 Benign neoplasm of sigmoid colon: Secondary | ICD-10-CM | POA: Diagnosis not present

## 2018-08-18 DIAGNOSIS — K635 Polyp of colon: Secondary | ICD-10-CM | POA: Diagnosis not present

## 2018-08-18 DIAGNOSIS — D122 Benign neoplasm of ascending colon: Secondary | ICD-10-CM | POA: Diagnosis not present

## 2018-08-18 DIAGNOSIS — Z1211 Encounter for screening for malignant neoplasm of colon: Secondary | ICD-10-CM | POA: Diagnosis not present

## 2018-10-06 ENCOUNTER — Ambulatory Visit: Payer: BLUE CROSS/BLUE SHIELD | Admitting: Hematology and Oncology

## 2018-10-13 DIAGNOSIS — E039 Hypothyroidism, unspecified: Secondary | ICD-10-CM | POA: Diagnosis not present

## 2018-10-20 ENCOUNTER — Inpatient Hospital Stay: Payer: BLUE CROSS/BLUE SHIELD | Attending: Hematology and Oncology | Admitting: Adult Health

## 2018-10-20 ENCOUNTER — Encounter: Payer: Self-pay | Admitting: Adult Health

## 2018-10-20 ENCOUNTER — Telehealth: Payer: Self-pay | Admitting: Hematology and Oncology

## 2018-10-20 VITALS — BP 117/87 | HR 73 | Temp 98.6°F | Resp 18 | Ht 66.0 in | Wt 141.8 lb

## 2018-10-20 DIAGNOSIS — Z79811 Long term (current) use of aromatase inhibitors: Secondary | ICD-10-CM | POA: Diagnosis not present

## 2018-10-20 DIAGNOSIS — C50412 Malignant neoplasm of upper-outer quadrant of left female breast: Secondary | ICD-10-CM | POA: Diagnosis not present

## 2018-10-20 DIAGNOSIS — Z923 Personal history of irradiation: Secondary | ICD-10-CM | POA: Diagnosis not present

## 2018-10-20 DIAGNOSIS — Z170 Estrogen receptor positive status [ER+]: Secondary | ICD-10-CM | POA: Insufficient documentation

## 2018-10-20 DIAGNOSIS — E2839 Other primary ovarian failure: Secondary | ICD-10-CM

## 2018-10-20 DIAGNOSIS — K219 Gastro-esophageal reflux disease without esophagitis: Secondary | ICD-10-CM

## 2018-10-20 DIAGNOSIS — Z9221 Personal history of antineoplastic chemotherapy: Secondary | ICD-10-CM | POA: Diagnosis not present

## 2018-10-20 NOTE — Telephone Encounter (Signed)
Gave avs and calendar ° °

## 2018-10-20 NOTE — Progress Notes (Signed)
CLINIC:  Survivorship   REASON FOR VISIT:  Routine follow-up post-treatment for a recent history of breast cancer.  BRIEF ONCOLOGIC HISTORY:    Malignant neoplasm of upper-outer quadrant of left breast in female, estrogen receptor positive ([place])   08/29/2017 Initial Diagnosis    Screening detected left breast mass 4 cm at 12 o'clock position, 1.4 cm lymph node the axilla, biopsy of the mass and the lymph nodes were positive for IDC with DCIS grade 3 ER 70% PR 0%, KI 2%, HER-2 negative ratio 1.77, T2N1 stage IIIa clinical stage AJCC 8    09/11/2017 Breast MRI    Left breast malignancy with associated linear NME extending posteriorly as well as satellite nodules measures up to 5.3 cm, at least 4 abnormal left axillary lymph nodes    09/11/2017 Imaging    CT CAP and Bone scan negative for metastatic disease    09/18/2017 Miscellaneous    Mammaprint high risk luminal type B.  Average 10-year risk of recurrence untreated 29%; with chemotherapy and hormonal therapy distant metastasis free interval at 5 years 94.6%    10/02/2017 - 02/12/2018 Neo-Adjuvant Chemotherapy    Neoadjuvant chemotherapy with dose dense Adriamycin and Cytoxan x4 followed by Taxol weekly x11    02/14/2018 Breast MRI    Near complete imaging response of the known cancer in the left breast neoadjuvant chemotherapy. There is a 2 mm residual focus of enhancement approximately 1.2 cm inferior and medial to the biopsy marking clip.     03/13/2018 Surgery    Left lumpectomy: IDC with DCIS grade 3, 0.6 cm, 0/6 lymph nodes negative, margins negative, ER 70%, PR 0%, HER-2 negative ratio 1.77, RCB class II ypT1b, ypN0    04/21/2018 - 06/02/2018 Radiation Therapy    Adj XRT    06/13/2018 -  Anti-estrogen oral therapy    Letrozole 2.5 mg daily     INTERVAL HISTORY:  Ms. Sweatman presents to the [facility] today for our initial meeting to review her survivorship care plan detailing her treatment course for breast cancer,  as well as monitoring long-term side effects of that treatment, education regarding health maintenance, screening, and overall wellness and health promotion.     Overall, Ms. Lafferty reports feeling quite well.  She is taking Letrozole dialy and is tolerating it well.  She notes occasional achiness in her left arm, but otherwise denies any arthralgias, hot flashes, or vaginal dryness.  She notes that she has occasional flashes of pain in her left breast.  She also has some stiffness in her left shoulder.  She is exercising her shoulder daily.      REVIEW OF SYSTEMS:  Review of Systems  Constitutional: Negative for appetite change, chills, fatigue, fever and unexpected weight change.  HENT:   Negative for hearing loss, mouth sores and trouble swallowing.   Eyes: Negative for eye problems and icterus.  Respiratory: Negative for chest tightness, cough and shortness of breath.   Cardiovascular: Negative for chest pain, leg swelling and palpitations.  Gastrointestinal: Negative for abdominal distention, abdominal pain, constipation, diarrhea, nausea and vomiting.  Endocrine: Negative for hot flashes.  Musculoskeletal: Negative for arthralgias.  Skin: Negative for itching and rash.  Neurological: Negative for dizziness, extremity weakness, headaches and numbness.  Hematological: Negative for adenopathy. Does not bruise/bleed easily.  Psychiatric/Behavioral: Negative for depression. The patient is not nervous/anxious.   Breast: Denies any new nodularity, masses, tenderness, nipple changes, or nipple discharge.      ONCOLOGY TREATMENT TEAM:  1.  Surgeon:  Dr. Marlou Starks at [facility] 2. Medical Oncologist: Dr. Lindi Adie  3. Radiation Oncologist: Dr. Isidore Moos    PAST MEDICAL/SURGICAL HISTORY:  Past Medical History:  Diagnosis Date  . Dental bridge present    upper and lower  . GERD (gastroesophageal reflux disease)    occasional - TUMS as needed  . History of chemotherapy    finished  chemo 02/05/2018  . History of left breast cancer 2018  . History of radiation therapy 04/21/18- 06/02/18   Left Breast and regional nodes- 50 Gy in 25 fractions, Left Breast boost 10 Gy in 5 fractions.    Past Surgical History:  Procedure Laterality Date  . APPENDECTOMY  1982  . BREAST LUMPECTOMY WITH RADIOACTIVE SEED AND SENTINEL LYMPH NODE BIOPSY Left 03/13/2018   Procedure: LEFT BREAST LUMPECTOMY WITH RADIOACTIVE SEED AND LEFT RADIOACTIVE SEED TARGETED AXILLARY LYMPH NODE EXCISION AND LEFT SENTINEL LYMPH NODE BIOPSY;  Surgeon: Jovita Kussmaul, MD;  Location: [place];  Service: General;  Laterality: Left;  . PORT-A-CATH REMOVAL Right 03/13/2018   Procedure: REMOVAL PORT-A-CATH;  Surgeon: Jovita Kussmaul, MD;  Location: [place];  Service: General;  Laterality: Right;  . PORTACATH PLACEMENT Right 09/27/2017   Procedure: INSERTION PORT-A-CATH;  Surgeon: Jovita Kussmaul, MD;  Location: [place];  Service: General;  Laterality: Right;  . WISDOM TOOTH EXTRACTION       ALLERGIES:  Allergies  Allergen Reactions  . Adhesive [Tape] Itching     CURRENT MEDICATIONS:  Outpatient Encounter Medications as of 10/20/2018  Medication Sig  . letrozole (FEMARA) 2.5 MG tablet Take 1 tablet (2.5 mg total) by mouth daily.  . [DISCONTINUED] acetaminophen (TYLENOL) 325 MG tablet Take 650 mg by mouth every 6 (six) hours as needed.  . [DISCONTINUED] HYDROcodone-acetaminophen (NORCO/VICODIN) 5-325 MG tablet Take 1-2 tablets by mouth every 6 (six) hours as needed for moderate pain or severe pain. (Patient not taking: Reported on 06/03/2018)   No facility-administered encounter medications on file as of 10/20/2018.      ONCOLOGIC FAMILY HISTORY:  Family History  Problem Relation Age of Onset  . Diabetes Father   . Hypertension Father   . Hypertension Other      GENETIC COUNSELING/TESTING: Not at this time  SOCIAL HISTORY:  Social History    Socioeconomic History  . Marital status: Married    Spouse name: Not on file  . Number of children: Not on file  . Years of education: Not on file  . Highest education level: Not on file  Occupational History  . Not on file  Social Needs  . Financial resource strain: Not on file  . Food insecurity:    Worry: Not on file    Inability: Not on file  . Transportation needs:    Medical: Not on file    Non-medical: Not on file  Tobacco Use  . Smoking status: Never Smoker  . Smokeless tobacco: Never Used  Substance and Sexual Activity  . Alcohol use: No  . Drug use: No  . Sexual activity: Yes    Birth control/protection: Post-menopausal  Lifestyle  . Physical activity:    Days per week: Not on file    Minutes per session: Not on file  . Stress: Not on file  Relationships  . Social connections:    Talks on phone: Not on file    Gets together: Not on file    Attends religious service: Not on file  Active member of club or organization: Not on file    Attends meetings of clubs or organizations: Not on file    Relationship status: Not on file  . Intimate partner violence:    Fear of current or ex partner: Not on file    Emotionally abused: Not on file    Physically abused: Not on file    Forced sexual activity: Not on file  Other Topics Concern  . Not on file  Social History Narrative  . Not on file      PHYSICAL EXAMINATION:  Vital Signs:   Vitals:   10/20/18 1035  BP: 117/87  Pulse: 73  Resp: 18  Temp: 98.6 F (37 C)  SpO2: 98%   Filed Weights   10/20/18 1035  Weight: 141 lb 12.8 oz (64.3 kg)   ECOG: 1 General: Well-nourished, well-appearing female in no acute distress.  She is unaccompanied today.   HEENT: Head is normocephalic.  Pupils equal and reactive to light. Conjunctivae clear without exudate.  Sclerae anicteric. Oral mucosa is pink, moist.  Oropharynx is pink without lesions or erythema.  Lymph: No cervical, supraclavicular, or  infraclavicular lymphadenopathy noted on palpation.  Cardiovascular: Regular rate and rhythm.Marland Kitchen Respiratory: Clear to auscultation bilaterally. Chest expansion symmetric; breathing non-labored.  Breasts: left breast s/p lumpectomy and radiation, no sign of local recurrence, right breast benign GI: Abdomen soft and round; non-tender, non-distended. Bowel sounds normoactive.  GU: Deferred.  Neuro: No focal deficits. Steady gait.  Psych: Mood and affect normal and appropriate for situation.  Extremities: No edema. MSK: No focal spinal tenderness to palpation.  Full range of motion in bilateral upper extremities Skin: Warm and dry.  LABORATORY DATA:  None for this visit.  DIAGNOSTIC IMAGING:  None for this visit.      ASSESSMENT AND PLAN:  Ms.. Ostlund is a pleasant 52 y.o. female with Stage IIIB left breast invasive ductal carcinoma, ER+/PR-/HER2-, diagnosed in 08/2017, treated with neo-adjuvant chemotherapy,  lumpectomy, adjuvant radiation therapy, and anti-estrogen therapy with Letrozole beginning in 05/2018.  She presents to the Survivorship Clinic for our initial meeting and routine follow-up post-completion of treatment for breast cancer.    1. Stage IIIB left breast cancer:  Ms. Dearinger is continuing to recover from definitive treatment for breast cancer. She will follow-up with her medical oncologist, Dr. Lindi Adie in 3 months with history and physical exam per surveillance protocol.  She will continue her anti-estrogen therapy with Letrozole. Thus far, she is tolerating the Letrozole well, with minimal side effects. Today, a comprehensive survivorship care plan and treatment summary was reviewed with the patient today detailing her breast cancer diagnosis, treatment course, potential late/long-term effects of treatment, appropriate follow-up care with recommendations for the future, and patient education resources.  A copy of this summary, along with a letter will be sent to the patient's  primary care provider via mail/fax/In Basket message after today's visit.    2. Bone health:  Given Ms. Prather's age/history of breast cancer and her current treatment regimen including anti-estrogen therapy with Letrozole, she is at risk for bone demineralization.  She has not yet undergone bone density and I ordered this for 11/2018 at [facility]. She was given education on specific activities to promote bone health.  3. Cancer screening:  Due to Ms. Ratcliffe's history and her age, she should receive screening for skin cancers, colon cancer, and gynecologic cancers.  The information and recommendations are listed on the patient's comprehensive care plan/treatment summary and were reviewed in  detail with the patient.    4. Health maintenance and wellness promotion: Ms. Vanallen was encouraged to consume 5-7 servings of fruits and vegetables per day. We reviewed the "Nutrition Rainbow" handout, as well as the handout "Take Control of Your Health and Reduce Your Cancer Risk" from the [employer].  She was also encouraged to engage in moderate to vigorous exercise for 30 minutes per day most days of the week. We discussed the [employer] fitness program, which is designed for cancer survivors to help them become more physically fit after cancer treatments.  She was instructed to limit her alcohol consumption and continue to abstain from tobacco use.     5. Support services/counseling: It is not uncommon for this period of the patient's cancer care trajectory to be one of many emotions and stressors.  We discussed an opportunity for her to participate in the next session of [facility] ("Finding Your New Normal") support group series designed for patients after they have completed treatment.   Ms. Greulich was encouraged to take advantage of our many other support services programs, support groups, and/or counseling in coping with her new life as a cancer survivor after completing anti-cancer treatment.  She was  offered support today through active listening and expressive supportive counseling.  She was given information regarding our available services and encouraged to contact me with any questions or for help enrolling in any of our support group/programs.    Dispo:   -Return to cancer center in 3 months for f/u with Dr. Lindi Adie  -Mammogram due in 11/2018 -Bone density in 11/2018 -She is welcome to return back to the Survivorship Clinic at any time; no additional follow-up needed at this time.  -Consider referral back to survivorship as a long-term survivor for continued surveillance  A total of (30) minutes of face-to-face time was spent with this patient with greater than 50% of that time in counseling and care-coordination.   Gardenia Phlegm, NP Survivorship Program [place] [phone]   Note: PRIMARY CARE PROVIDER [name], [place] [phone]

## 2018-10-21 DIAGNOSIS — E039 Hypothyroidism, unspecified: Secondary | ICD-10-CM | POA: Diagnosis not present

## 2018-10-21 DIAGNOSIS — E010 Iodine-deficiency related diffuse (endemic) goiter: Secondary | ICD-10-CM | POA: Diagnosis not present

## 2018-12-09 ENCOUNTER — Encounter: Payer: Self-pay | Admitting: Hematology and Oncology

## 2018-12-09 DIAGNOSIS — M8589 Other specified disorders of bone density and structure, multiple sites: Secondary | ICD-10-CM | POA: Diagnosis not present

## 2018-12-09 DIAGNOSIS — Z853 Personal history of malignant neoplasm of breast: Secondary | ICD-10-CM | POA: Diagnosis not present

## 2018-12-09 DIAGNOSIS — Z780 Asymptomatic menopausal state: Secondary | ICD-10-CM | POA: Diagnosis not present

## 2019-01-16 DIAGNOSIS — E039 Hypothyroidism, unspecified: Secondary | ICD-10-CM | POA: Diagnosis not present

## 2019-01-16 DIAGNOSIS — Z0000 Encounter for general adult medical examination without abnormal findings: Secondary | ICD-10-CM | POA: Diagnosis not present

## 2019-01-16 DIAGNOSIS — Z79899 Other long term (current) drug therapy: Secondary | ICD-10-CM | POA: Diagnosis not present

## 2019-01-16 DIAGNOSIS — E559 Vitamin D deficiency, unspecified: Secondary | ICD-10-CM | POA: Diagnosis not present

## 2019-01-18 NOTE — Assessment & Plan Note (Signed)
08/29/2017: Screening detected left breast mass 4 cm at 12 o'clock position, 1.4 cm lymph node the axilla, biopsy of the mass and the lymph nodes were positive for IDC with DCIS grade 3 ER 70% PR 0%, KI 2%, HER-2 negative ratio 1.77, T2N1 stage IIIa clinical stage AJCC 8  Mammaprinthigh risk luminal type B.   03/13/2018:Left lumpectomy: IDC with DCIS grade 3, 0.6 cm, 0/6 lymph nodes negative, margins negative, ER 70%, PR 0%, HER-2 negative ratio 1.77, RCB class II ypT1b, ypN0  Treatment summary: 1.Neoadjuvant chemotherapy with dose dense Adriamycin and Cytoxan followed by Taxol x12completed 02/12/2018 2. Followed by breast conserving surgery and targeted lymph node dissection on 03/13/2018 3. Followed by radiation 7/1/2019to8/06/2018 4. Antiestrogen therapy with Letrozole 2.5 mg daily X 7 years started 07/07/2018 -------------------------------------------------------------------- Letrozole Toxicities:  Breast Cancer Surveillance: 1. Mammogram to be done annually RTC in 1 year for follow up

## 2019-01-19 ENCOUNTER — Inpatient Hospital Stay: Payer: BLUE CROSS/BLUE SHIELD | Attending: Hematology and Oncology | Admitting: Hematology and Oncology

## 2019-01-19 DIAGNOSIS — Z170 Estrogen receptor positive status [ER+]: Secondary | ICD-10-CM

## 2019-01-19 DIAGNOSIS — C50412 Malignant neoplasm of upper-outer quadrant of left female breast: Secondary | ICD-10-CM | POA: Diagnosis not present

## 2019-01-19 NOTE — Progress Notes (Signed)
HEMATOLOGY-ONCOLOGY TELEPHONE VISIT PROGRESS NOTE  I connected with '@PTNAME'$ @ on 01/19/19 at  2:15 PM EDT by telephone and verified that I am speaking with the correct person using two identifiers.  I discussed the limitations, risks, security and privacy concerns of performing an evaluation and management service by telephone and the availability of in person appointments.  I also discussed with the patient that there may be a patient responsible charge related to this service. The patient expressed understanding and agreed to proceed.   History of Present Illness: left arm pain, tolerating letrozole well.    Malignant neoplasm of upper-outer quadrant of left breast in female, estrogen receptor positive ([place])   08/29/2017 Initial Diagnosis    Screening detected left breast mass 4 cm at 12 o'clock position, 1.4 cm lymph node the axilla, biopsy of the mass and the lymph nodes were positive for IDC with DCIS grade 3 ER 70% PR 0%, KI 2%, HER-2 negative ratio 1.77, T2N1 stage IIIa clinical stage AJCC 8    09/11/2017 Breast MRI    Left breast malignancy with associated linear NME extending posteriorly as well as satellite nodules measures up to 5.3 cm, at least 4 abnormal left axillary lymph nodes    09/11/2017 Imaging    CT CAP and Bone scan negative for metastatic disease    09/18/2017 Miscellaneous    Mammaprint high risk luminal type B.  Average 10-year risk of recurrence untreated 29%; with chemotherapy and hormonal therapy distant metastasis free interval at 5 years 94.6%    10/02/2017 - 02/12/2018 Neo-Adjuvant Chemotherapy    Neoadjuvant chemotherapy with dose dense Adriamycin and Cytoxan x4 followed by Taxol weekly x11    02/14/2018 Breast MRI    Near complete imaging response of the known cancer in the left breast neoadjuvant chemotherapy. There is a 2 mm residual focus of enhancement approximately 1.2 cm inferior and medial to the biopsy marking clip.     03/13/2018 Surgery    Left  lumpectomy: IDC with DCIS grade 3, 0.6 cm, 0/6 lymph nodes negative, margins negative, ER 70%, PR 0%, HER-2 negative ratio 1.77, RCB class II ypT1b, ypN0    04/21/2018 - 06/02/2018 Radiation Therapy    Adj XRT    06/13/2018 -  Anti-estrogen oral therapy    Letrozole 2.5 mg daily     Observations/Objective:  Left arm pain which gets better with exercise. Worse is 5/10. Due to XRT and prior surgery. No hot flashes   Assessment Plan:  Malignant neoplasm of upper-outer quadrant of left breast in female, estrogen receptor positive ([place]) 08/29/2017: Screening detected left breast mass 4 cm at 12 o'clock position, 1.4 cm lymph node the axilla, biopsy of the mass and the lymph nodes were positive for IDC with DCIS grade 3 ER 70% PR 0%, KI 2%, HER-2 negative ratio 1.77, T2N1 stage IIIa clinical stage AJCC 8  Mammaprinthigh risk luminal type B.   03/13/2018:Left lumpectomy: IDC with DCIS grade 3, 0.6 cm, 0/6 lymph nodes negative, margins negative, ER 70%, PR 0%, HER-2 negative ratio 1.77, RCB class II ypT1b, ypN0  Treatment summary: 1.Neoadjuvant chemotherapy with dose dense Adriamycin and Cytoxan followed by Taxol x12completed 02/12/2018 2. Followed by breast conserving surgery and targeted lymph node dissection on 03/13/2018 3. Followed by radiation 7/1/2019to8/06/2018 4. Antiestrogen therapy with Letrozole 2.5 mg daily X 7 years started 07/07/2018 -------------------------------------------------------------------- Letrozole Toxicities: No side effects  Left arm pian: improves with exercise.  Breast Cancer Surveillance: 1. Mammogram to be done annually. Patient had mammogram  and bone density at [facility]. Will request copies I will call her tomorrow with bone density results RTC in 1 year for follow up    I discussed the assessment and treatment plan with the patient. The patient was provided an opportunity to ask questions and all were answered. The patient agreed with the plan  and demonstrated an understanding of the instructions. The patient was advised to call back or seek an in-person evaluation if the symptoms worsen or if the condition fails to improve as anticipated.   I provided 15 minutes of non-face-to-face time during this encounter. Harriette Ohara, MD

## 2019-01-20 ENCOUNTER — Telehealth: Payer: Self-pay | Admitting: Hematology and Oncology

## 2019-01-20 NOTE — Telephone Encounter (Signed)
Scheduled appt per 3/30 sch messag e- f/u in one year sent reminder letter in the mail

## 2019-01-20 NOTE — Telephone Encounter (Signed)
I informed the patient that I reviewed the mammogram and bone density test reports. Bone density showed a T score of -2 which is osteopenia. Instructed her to continue with her calcium and vitamin D.  If there is any worsening of bone density then we will put her on bisphosphonate therapy. At this point she will do weightbearing exercises and take calcium and vitamin D.

## 2019-03-13 DIAGNOSIS — E063 Autoimmune thyroiditis: Secondary | ICD-10-CM | POA: Diagnosis not present

## 2019-03-13 DIAGNOSIS — M6780 Other specified disorders of synovium and tendon, unspecified site: Secondary | ICD-10-CM | POA: Diagnosis not present

## 2019-05-05 DIAGNOSIS — E039 Hypothyroidism, unspecified: Secondary | ICD-10-CM | POA: Diagnosis not present

## 2019-05-05 DIAGNOSIS — E559 Vitamin D deficiency, unspecified: Secondary | ICD-10-CM | POA: Diagnosis not present

## 2019-06-03 ENCOUNTER — Other Ambulatory Visit: Payer: Self-pay | Admitting: Hematology and Oncology

## 2019-07-16 DIAGNOSIS — Z01419 Encounter for gynecological examination (general) (routine) without abnormal findings: Secondary | ICD-10-CM | POA: Diagnosis not present

## 2019-07-16 DIAGNOSIS — Z6822 Body mass index (BMI) 22.0-22.9, adult: Secondary | ICD-10-CM | POA: Diagnosis not present

## 2019-07-16 DIAGNOSIS — N898 Other specified noninflammatory disorders of vagina: Secondary | ICD-10-CM | POA: Diagnosis not present

## 2019-07-16 DIAGNOSIS — Z124 Encounter for screening for malignant neoplasm of cervix: Secondary | ICD-10-CM | POA: Diagnosis not present

## 2019-08-04 DIAGNOSIS — M79671 Pain in right foot: Secondary | ICD-10-CM | POA: Diagnosis not present

## 2019-08-04 DIAGNOSIS — M79641 Pain in right hand: Secondary | ICD-10-CM | POA: Diagnosis not present

## 2019-11-30 DIAGNOSIS — H1012 Acute atopic conjunctivitis, left eye: Secondary | ICD-10-CM | POA: Diagnosis not present

## 2019-12-15 ENCOUNTER — Encounter: Payer: Self-pay | Admitting: Hematology and Oncology

## 2019-12-15 DIAGNOSIS — C50412 Malignant neoplasm of upper-outer quadrant of left female breast: Secondary | ICD-10-CM | POA: Diagnosis not present

## 2019-12-15 DIAGNOSIS — R928 Other abnormal and inconclusive findings on diagnostic imaging of breast: Secondary | ICD-10-CM | POA: Diagnosis not present

## 2019-12-16 DIAGNOSIS — Z03818 Encounter for observation for suspected exposure to other biological agents ruled out: Secondary | ICD-10-CM | POA: Diagnosis not present

## 2019-12-16 DIAGNOSIS — Z20828 Contact with and (suspected) exposure to other viral communicable diseases: Secondary | ICD-10-CM | POA: Diagnosis not present

## 2020-01-15 ENCOUNTER — Telehealth: Payer: Self-pay | Admitting: Hematology and Oncology

## 2020-01-15 NOTE — Telephone Encounter (Signed)
Rescheduled 03/31 appointment due to provider being on pal, patient has been called and notified.

## 2020-01-20 ENCOUNTER — Ambulatory Visit: Payer: BLUE CROSS/BLUE SHIELD | Admitting: Hematology and Oncology

## 2020-01-20 DIAGNOSIS — Z0000 Encounter for general adult medical examination without abnormal findings: Secondary | ICD-10-CM | POA: Diagnosis not present

## 2020-01-20 DIAGNOSIS — E7800 Pure hypercholesterolemia, unspecified: Secondary | ICD-10-CM | POA: Diagnosis not present

## 2020-01-20 DIAGNOSIS — E039 Hypothyroidism, unspecified: Secondary | ICD-10-CM | POA: Diagnosis not present

## 2020-01-26 NOTE — Progress Notes (Signed)
Patient Care Team: Glenis Smoker, MD as PCP - General (Family Medicine) Nicholas Lose, MD as Consulting Physician (Hematology and Oncology) Eppie Gibson, MD as Attending Physician (Radiation Oncology) Jovita Kussmaul, MD as Consulting Physician (General Surgery) Gardenia Phlegm, NP as Nurse Practitioner (Hematology and Oncology)  DIAGNOSIS:    ICD-10-CM   1. Malignant neoplasm of upper-outer quadrant of left breast in female, estrogen receptor positive ([place])  C50.412 DG Bone Density   Z17.0     SUMMARY OF ONCOLOGIC HISTORY: Oncology History  Malignant neoplasm of upper-outer quadrant of left breast in female, estrogen receptor positive ([place])  08/29/2017 Initial Diagnosis   Screening detected left breast mass 4 cm at 12 o'clock position, 1.4 cm lymph node the axilla, biopsy of the mass and the lymph nodes were positive for IDC with DCIS grade 3 ER 70% PR 0%, KI 2%, HER-2 negative ratio 1.77, T2N1 stage IIIa clinical stage AJCC 8   09/11/2017 Breast MRI   Left breast malignancy with associated linear NME extending posteriorly as well as satellite nodules measures up to 5.3 cm, at least 4 abnormal left axillary lymph nodes   09/11/2017 Imaging   CT CAP and Bone scan negative for metastatic disease   09/18/2017 Miscellaneous   Mammaprint high risk luminal type B.  Average 10-year risk of recurrence untreated 29%; with chemotherapy and hormonal therapy distant metastasis free interval at 5 years 94.6%   10/02/2017 - 02/12/2018 Neo-Adjuvant Chemotherapy   Neoadjuvant chemotherapy with dose dense Adriamycin and Cytoxan x4 followed by Taxol weekly x11   02/14/2018 Breast MRI   Near complete imaging response of the known cancer in the left breast neoadjuvant chemotherapy. There is a 2 mm residual focus of enhancement approximately 1.2 cm inferior and medial to the biopsy marking clip.    03/13/2018 Surgery   Left lumpectomy: IDC with DCIS grade 3, 0.6 cm, 0/6 lymph nodes  negative, margins negative, ER 70%, PR 0%, HER-2 negative ratio 1.77, RCB class II ypT1b, ypN0   04/21/2018 - 06/02/2018 Radiation Therapy   Adj XRT   06/13/2018 -  Anti-estrogen oral therapy   Letrozole 2.5 mg daily     CHIEF COMPLIANT: Follow-up of left breast cancer on letrozole  INTERVAL HISTORY: Tonya Jackson is a 54 y.o. with above-mentioned history of left breast cancer treated with neoadjuvant chemotherapy, left lumpectomy, radiation, and is currently on antiestrogen therapy with letrozole. She presents to the clinic today for annual follow-up.   ALLERGIES:  is allergic to adhesive [tape].  MEDICATIONS:  Current Outpatient Medications  Medication Sig Dispense Refill  . letrozole (FEMARA) 2.5 MG tablet Take 1 tablet (2.5 mg total) by mouth daily. 90 tablet 3   No current facility-administered medications for this visit.    PHYSICAL EXAMINATION: ECOG PERFORMANCE STATUS: 1 - Symptomatic but completely ambulatory  Vitals:   01/27/20 1459  BP: 123/80  Pulse: 87  Resp: 20  Temp: 98.5 F (36.9 C)  SpO2: 100%   Filed Weights   01/27/20 1459 01/27/20 1504  Weight: 137 lb 12.8 oz (62.5 kg) 137 lb 12.8 oz (62.5 kg)    BREAST: No palpable masses or nodules in either right or left breasts. No palpable axillary supraclavicular or infraclavicular adenopathy no breast tenderness or nipple discharge. (exam performed in the presence of a chaperone)  LABORATORY DATA:  I have reviewed the data as listed CMP Latest Ref Rng & Units 02/12/2018 02/05/2018 01/29/2018  Glucose 70 - 140 mg/dL 85 78 88  BUN  7 - 26 mg/dL '12 7 8  '$ Creatinine 0.60 - 1.10 mg/dL 0.65 0.63 0.65  Sodium 136 - 145 mmol/L 140 139 141  Potassium 3.5 - 5.1 mmol/L 3.9 3.9 4.0  Chloride 98 - 109 mmol/L 106 105 104  CO2 22 - 29 mmol/L 24 30(H) 29  Calcium 8.4 - 10.4 mg/dL 9.2 9.3 9.3  Total Protein 6.4 - 8.3 g/dL 6.5 6.7 6.5  Total Bilirubin 0.2 - 1.2 mg/dL 0.5 0.5 0.4  Alkaline Phos 40 - 150 U/L 59 67 66  AST 5 -  34 U/L '18 21 31  '$ ALT 0 - 55 U/L 25 31 68(H)    Lab Results  Component Value Date   WBC 2.6 (L) 02/12/2018   HGB 10.5 (L) 02/12/2018   HCT 31.1 (L) 02/12/2018   MCV 94.1 02/12/2018   PLT 195 02/12/2018   NEUTROABS 1.7 02/12/2018    ASSESSMENT & PLAN:  Malignant neoplasm of upper-outer quadrant of left breast in female, estrogen receptor positive ([place]) 08/29/2017: Screening detected left breast mass 4 cm at 12 o'clock position, 1.4 cm lymph node the axilla, biopsy of the mass and the lymph nodes were positive for IDC with DCIS grade 3 ER 70% PR 0%, KI 2%, HER-2 negative ratio 1.77, T2N1 stage IIIa clinical stage AJCC 8  Mammaprinthigh risk luminal type B.   03/13/2018:Left lumpectomy: IDC with DCIS grade 3, 0.6 cm, 0/6 lymph nodes negative, margins negative, ER 70%, PR 0%, HER-2 negative ratio 1.77, RCB class II ypT1b, ypN0  Treatment summary: 1.Neoadjuvant chemotherapy with dose dense Adriamycin and Cytoxan followed by Taxol x12completed 02/12/2018 2. Followed by breast conserving surgery and targeted lymph node dissection on 03/13/2018 3. Followed by radiation 7/1/2019to8/06/2018 4. Antiestrogen therapywith Letrozole 2.5 mg daily X 7 years started9/16/2019 -------------------------------------------------------------------- Letrozole Toxicities: No side effects Left arm pian: improves with exercise.  Breast Cancer Surveillance: 1. Mammogram  at [facility].  12/09/2018: Benign breast density category B 2.  Bone density 12/09/2018: T score -2: Osteopenia recommended calcium and vitamin D and bisphosphonate therapy.    RTC in 1 year for follow up    Orders Placed This Encounter  Procedures  . DG Bone Density    Standing Status:   Future    Standing Expiration Date:   01/26/2021    Scheduling Instructions:     Along with her mammograms    Order Specific Question:   Reason for Exam (SYMPTOM  OR DIAGNOSIS REQUIRED)    Answer:   Osteopenia    Order Specific Question:    Is the patient pregnant?    Answer:   No    Order Specific Question:   Preferred imaging location?    Answer:   External    Comments:   [facility]    Order Specific Question:   Release to patient    Answer:   Immediate   The patient has a good understanding of the overall plan. she agrees with it. she will call with any problems that may develop before the next visit here.  Total time spent: 20 mins including face to face time and time spent for planning, charting and coordination of care  Nicholas Lose, MD 01/27/2020  I, Cloyde Reams Dorshimer, am acting as scribe for Dr. Nicholas Lose.  I have reviewed the above documentation for accuracy and completeness, and I agree with the above.

## 2020-01-27 ENCOUNTER — Other Ambulatory Visit: Payer: Self-pay

## 2020-01-27 ENCOUNTER — Inpatient Hospital Stay: Payer: BC Managed Care – PPO | Attending: Hematology and Oncology | Admitting: Hematology and Oncology

## 2020-01-27 DIAGNOSIS — Z923 Personal history of irradiation: Secondary | ICD-10-CM | POA: Diagnosis not present

## 2020-01-27 DIAGNOSIS — Z170 Estrogen receptor positive status [ER+]: Secondary | ICD-10-CM

## 2020-01-27 DIAGNOSIS — Z79811 Long term (current) use of aromatase inhibitors: Secondary | ICD-10-CM | POA: Diagnosis not present

## 2020-01-27 DIAGNOSIS — M8580 Other specified disorders of bone density and structure, unspecified site: Secondary | ICD-10-CM | POA: Insufficient documentation

## 2020-01-27 DIAGNOSIS — C50412 Malignant neoplasm of upper-outer quadrant of left female breast: Secondary | ICD-10-CM | POA: Insufficient documentation

## 2020-01-27 MED ORDER — LETROZOLE 2.5 MG PO TABS: 2.5000 mg | ORAL_TABLET | Freq: Every day | ORAL | 3 refills | Status: DC

## 2020-01-27 NOTE — Assessment & Plan Note (Signed)
08/29/2017: Screening detected left breast mass 4 cm at 12 o'clock position, 1.4 cm lymph node the axilla, biopsy of the mass and the lymph nodes were positive for IDC with DCIS grade 3 ER 70% PR 0%, KI 2%, HER-2 negative ratio 1.77, T2N1 stage IIIa clinical stage AJCC 8  Mammaprinthigh risk luminal type B.   03/13/2018:Left lumpectomy: IDC with DCIS grade 3, 0.6 cm, 0/6 lymph nodes negative, margins negative, ER 70%, PR 0%, HER-2 negative ratio 1.77, RCB class II ypT1b, ypN0  Treatment summary: 1.Neoadjuvant chemotherapy with dose dense Adriamycin and Cytoxan followed by Taxol x12completed 02/12/2018 2. Followed by breast conserving surgery and targeted lymph node dissection on 03/13/2018 3. Followed by radiation 7/1/2019to8/06/2018 4. Antiestrogen therapywith Letrozole 2.5 mg daily X 7 years started9/16/2019 -------------------------------------------------------------------- Letrozole Toxicities: No side effects Left arm pian: improves with exercise.  Breast Cancer Surveillance: 1. Mammogram  at [facility].  12/09/2018: Benign breast density category B 2.  Bone density 12/09/2018: T score -2: Osteopenia recommended calcium and vitamin D and bisphosphonate therapy.    RTC in 1 year for follow up

## 2020-02-17 DIAGNOSIS — Z20828 Contact with and (suspected) exposure to other viral communicable diseases: Secondary | ICD-10-CM | POA: Diagnosis not present

## 2020-04-13 DIAGNOSIS — Z20822 Contact with and (suspected) exposure to covid-19: Secondary | ICD-10-CM | POA: Diagnosis not present

## 2020-07-18 DIAGNOSIS — Z853 Personal history of malignant neoplasm of breast: Secondary | ICD-10-CM | POA: Diagnosis not present

## 2020-07-18 DIAGNOSIS — Z6822 Body mass index (BMI) 22.0-22.9, adult: Secondary | ICD-10-CM | POA: Diagnosis not present

## 2020-07-18 DIAGNOSIS — Z01419 Encounter for gynecological examination (general) (routine) without abnormal findings: Secondary | ICD-10-CM | POA: Diagnosis not present

## 2020-07-18 DIAGNOSIS — N952 Postmenopausal atrophic vaginitis: Secondary | ICD-10-CM | POA: Diagnosis not present

## 2020-10-17 DIAGNOSIS — J069 Acute upper respiratory infection, unspecified: Secondary | ICD-10-CM | POA: Diagnosis not present

## 2020-10-17 DIAGNOSIS — U071 COVID-19: Secondary | ICD-10-CM | POA: Diagnosis not present

## 2020-10-25 DIAGNOSIS — U071 COVID-19: Secondary | ICD-10-CM | POA: Diagnosis not present

## 2021-01-03 DIAGNOSIS — Z853 Personal history of malignant neoplasm of breast: Secondary | ICD-10-CM | POA: Diagnosis not present

## 2021-01-23 DIAGNOSIS — E7800 Pure hypercholesterolemia, unspecified: Secondary | ICD-10-CM | POA: Diagnosis not present

## 2021-01-23 DIAGNOSIS — E039 Hypothyroidism, unspecified: Secondary | ICD-10-CM | POA: Diagnosis not present

## 2021-01-23 DIAGNOSIS — Z0000 Encounter for general adult medical examination without abnormal findings: Secondary | ICD-10-CM | POA: Diagnosis not present

## 2021-01-23 DIAGNOSIS — R5383 Other fatigue: Secondary | ICD-10-CM | POA: Diagnosis not present

## 2021-02-10 ENCOUNTER — Telehealth: Payer: Self-pay | Admitting: Hematology and Oncology

## 2021-02-10 ENCOUNTER — Other Ambulatory Visit: Payer: Self-pay | Admitting: Hematology and Oncology

## 2021-02-10 NOTE — Telephone Encounter (Signed)
Scheduled appt per 4/22 sch msg. Pt aware.  

## 2021-02-21 DIAGNOSIS — M79641 Pain in right hand: Secondary | ICD-10-CM | POA: Diagnosis not present

## 2021-03-27 NOTE — Assessment & Plan Note (Deleted)
08/29/2017: Screening detected left breast mass 4 cm at 12 o'clock position, 1.4 cm lymph node the axilla, biopsy of the mass and the lymph nodes were positive for IDC with DCIS grade 3 ER 70% PR 0%, KI 2%, HER-2 negative ratio 1.77, T2N1 stage IIIa clinical stage AJCC 8  Mammaprinthigh risk luminal type B.   03/13/2018:Left lumpectomy: IDC with DCIS grade 3, 0.6 cm, 0/6 lymph nodes negative, margins negative, ER 70%, PR 0%, HER-2 negative ratio 1.77, RCB class II ypT1b, ypN0  Treatment summary: 1.Neoadjuvant chemotherapy with dose dense Adriamycin and Cytoxan followed by Taxol x12completed 02/12/2018 2. Followed by breast conserving surgery and targeted lymph node dissection on 03/13/2018 3. Followed by radiation 7/1/2019to8/06/2018 4. Antiestrogen therapywith Letrozole 2.5 mg daily X 7 years started9/16/2019 -------------------------------------------------------------------- Letrozole Toxicities: No side effects Left arm pian: improves with exercise.  Breast Cancer Surveillance: 1. Mammogram  at [facility].12/09/2018: Benign breast density category B 2.  Bone density 12/15/2019: T score -2: Osteopenia recommended calcium and vitamin D and bisphosphonate therapy.    RTC in 1 year for follow up

## 2021-03-28 ENCOUNTER — Telehealth: Payer: Self-pay | Admitting: Hematology and Oncology

## 2021-03-28 ENCOUNTER — Ambulatory Visit: Payer: BC Managed Care – PPO | Admitting: Hematology and Oncology

## 2021-03-28 DIAGNOSIS — C50412 Malignant neoplasm of upper-outer quadrant of left female breast: Secondary | ICD-10-CM

## 2021-03-28 NOTE — Telephone Encounter (Signed)
R/s appt per 6/7 sch msg. Pt aware.

## 2021-04-06 NOTE — Progress Notes (Signed)
Patient Care Team: Glenis Smoker, MD as PCP - General (Family Medicine) Nicholas Lose, MD as Consulting Physician (Hematology and Oncology) Eppie Gibson, MD as Attending Physician (Radiation Oncology) Jovita Kussmaul, MD as Consulting Physician (General Surgery) Gardenia Phlegm, NP as Nurse Practitioner (Hematology and Oncology)  DIAGNOSIS:    ICD-10-CM   1. Malignant neoplasm of upper-outer quadrant of left breast in female, estrogen receptor positive ([place])  C50.412    Z17.0       SUMMARY OF ONCOLOGIC HISTORY: Oncology History  Malignant neoplasm of upper-outer quadrant of left breast in female, estrogen receptor positive ([place])  08/29/2017 Initial Diagnosis   Screening detected left breast mass 4 cm at 12 o'clock position, 1.4 cm lymph node the axilla, biopsy of the mass and the lymph nodes were positive for IDC with DCIS grade 3 ER 70% PR 0%, KI 2%, HER-2 negative ratio 1.77, T2N1 stage IIIa clinical stage AJCC 8    09/11/2017 Breast MRI   Left breast malignancy with associated linear NME extending posteriorly as well as satellite nodules measures up to 5.3 cm, at least 4 abnormal left axillary lymph nodes    09/11/2017 Imaging   CT CAP and Bone scan negative for metastatic disease    09/18/2017 Miscellaneous   Mammaprint high risk luminal type B.  Average 10-year risk of recurrence untreated 29%; with chemotherapy and hormonal therapy distant metastasis free interval at 5 years 94.6%    10/02/2017 - 02/12/2018 Neo-Adjuvant Chemotherapy   Neoadjuvant chemotherapy with dose dense Adriamycin and Cytoxan x4 followed by Taxol weekly x11    02/14/2018 Breast MRI   Near complete imaging response of the known cancer in the left breast neoadjuvant chemotherapy. There is a 2 mm residual focus of enhancement approximately 1.2 cm inferior and medial to the biopsy marking clip.    03/13/2018 Surgery   Left lumpectomy: IDC with DCIS grade 3, 0.6 cm, 0/6 lymph nodes  negative, margins negative, ER 70%, PR 0%, HER-2 negative ratio 1.77, RCB class II ypT1b, ypN0    04/21/2018 - 06/02/2018 Radiation Therapy   Adj XRT    06/13/2018 -  Anti-estrogen oral therapy   Letrozole 2.5 mg daily      CHIEF COMPLIANT: Follow-up of left breast cancer on letrozole  INTERVAL HISTORY: Tonya Jackson is a 55 y.o. with above-mentioned history of left breast cancer treated with neoadjuvant chemotherapy, left lumpectomy, radiation, and is currently on antiestrogen therapy with letrozole. She presents to the clinic today for annual follow-up.  She is tolerating letrozole extremely well without any problems or concerns.  Does not have any hot flashes or arthralgias or myalgias.  Denies any pain lumps or nodules in the breast.  ALLERGIES:  is allergic to adhesive [tape].  MEDICATIONS:  Current Outpatient Medications  Medication Sig Dispense Refill   letrozole (FEMARA) 2.5 MG tablet TAKE 1 TABLET BY MOUTH EVERY DAY 90 tablet 3   No current facility-administered medications for this visit.    PHYSICAL EXAMINATION: ECOG PERFORMANCE STATUS: 1 - Symptomatic but completely ambulatory  Vitals:   04/07/21 0841  BP: 114/65  Pulse: 94  Resp: 17  Temp: 97.7 F (36.5 C)  SpO2: 100%   Filed Weights   04/07/21 0841  Weight: 138 lb 3.2 oz (62.7 kg)    BREAST: No palpable masses or nodules in either right or left breasts. No palpable axillary supraclavicular or infraclavicular adenopathy no breast tenderness or nipple discharge. (exam performed in the presence of a chaperone)  LABORATORY DATA:  I have reviewed the data as listed CMP Latest Ref Rng & Units 02/12/2018 02/05/2018 01/29/2018  Glucose 70 - 140 mg/dL 85 78 88  BUN 7 - 26 mg/dL _0 Creatinine 0.60 - 1.10 mg/dL 0.65 0.63 0.65  Sodium 136 - 145 mmol/L 140 139 141  Potassium 3.5 - 5.1 mmol/L 3.9 3.9 4.0  Chloride 98 - 109 mmol/L 106 105 104  CO2 22 - 29 mmol/L 24 30(H) 29  Calcium 8.4 - 10.4 mg/dL 9.2 9.3  9.3  Total Protein 6.4 - 8.3 g/dL 6.5 6.7 6.5  Total Bilirubin 0.2 - 1.2 mg/dL 0.5 0.5 0.4  Alkaline Phos 40 - 150 U/L 59 67 66  AST 5 - 34 U/L _1 ALT 0 - 55 U/L 25 31 68(H)    Lab Results  Component Value Date   WBC 2.6 (L) 02/12/2018   HGB 10.5 (L) 02/12/2018   HCT 31.1 (L) 02/12/2018   MCV 94.1 02/12/2018   PLT 195 02/12/2018   NEUTROABS 1.7 02/12/2018    ASSESSMENT & PLAN:  Malignant neoplasm of upper-outer quadrant of left breast in female, estrogen receptor positive ([place]) 08/29/2017: Screening detected left breast mass 4 cm at 12 o'clock position, 1.4 cm lymph node the axilla, biopsy of the mass and the lymph nodes were positive for IDC with DCIS grade 3 ER 70% PR 0%, KI 2%, HER-2 negative ratio 1.77, T2N1 stage IIIa clinical stage AJCC 8   Mammaprint high risk luminal type B.     03/13/2018: Left lumpectomy: IDC with DCIS grade 3, 0.6 cm, 0/6 lymph nodes negative, margins negative, ER 70%, PR 0%, HER-2 negative ratio 1.77, RCB class II ypT1b, ypN0   Treatment summary: 1.  Neoadjuvant chemotherapy with dose dense Adriamycin and Cytoxan followed by Taxol x12 completed 02/12/2018 2.  Followed by breast conserving surgery and targeted lymph node dissection on 03/13/2018 3.  Followed by radiation 04/21/2018 to 05/30/2018 4.  Antiestrogen therapy with Letrozole 2.5 mg daily X 7 years started 07/07/2018 -------------------------------------------------------------------- Letrozole Toxicities: No side effects Left arm pian: improves with exercise.   Breast Cancer Surveillance: 1. Mammogram  at [facility]. 01/03/2021: Benign breast density category B 2.  Bone density 12/09/2018: T score -2: Osteopenia recommended calcium and vitamin D and bisphosphonate therapy. 3. Breast Exam: 04/07/21: Benign    She is excited about going to [place] in 10 days.  She plans to stay there for about a month and spend time with her family.  RTC in 1 year for follow up   No orders of the defined types  were placed in this encounter.  The patient has a good understanding of the overall plan. she agrees with it. she will call with any problems that may develop before the next visit here.  Total time spent: 20 mins including face to face time and time spent for planning, charting and coordination of care  Rulon Eisenmenger, MD, MPH 04/07/2021  I, Thana Ates, am acting as scribe for Dr. Nicholas Lose.  I have reviewed the above documentation for accuracy and completeness, and I agree with the above.

## 2021-04-06 NOTE — Assessment & Plan Note (Signed)
08/29/2017: Screening detected left breast mass 4 cm at 12 o'clock position, 1.4 cm lymph node the axilla, biopsy of the mass and the lymph nodes were positive for IDC with DCIS grade 3 ER 70% PR 0%, KI 2%, HER-2 negative ratio 1.77, T2N1 stage IIIa clinical stage AJCC 8  Mammaprinthigh risk luminal type B.   03/13/2018:Left lumpectomy: IDC with DCIS grade 3, 0.6 cm, 0/6 lymph nodes negative, margins negative, ER 70%, PR 0%, HER-2 negative ratio 1.77, RCB class II ypT1b, ypN0  Treatment summary: 1.Neoadjuvant chemotherapy with dose dense Adriamycin and Cytoxan followed by Taxol x12completed 02/12/2018 2. Followed by breast conserving surgery and targeted lymph node dissection on 03/13/2018 3. Followed by radiation 7/1/2019to8/06/2018 4. Antiestrogen therapywith Letrozole 2.5 mg daily X 7 years started9/16/2019 -------------------------------------------------------------------- Letrozole Toxicities: No side effects Left arm pian: improves with exercise.  Breast Cancer Surveillance: 1. Mammogram  at [facility]. 12/15/19: Benign breast density category B 2.  Bone density 12/09/2018: T score -2: Osteopenia recommended calcium and vitamin D and bisphosphonate therapy. 3. Breast Exam: 04/07/21: Benign   RTC in 1 year for follow up

## 2021-04-07 ENCOUNTER — Other Ambulatory Visit: Payer: Self-pay

## 2021-04-07 ENCOUNTER — Inpatient Hospital Stay: Payer: BC Managed Care – PPO | Attending: Hematology and Oncology | Admitting: Hematology and Oncology

## 2021-04-07 DIAGNOSIS — Z170 Estrogen receptor positive status [ER+]: Secondary | ICD-10-CM | POA: Diagnosis not present

## 2021-04-07 DIAGNOSIS — Z79811 Long term (current) use of aromatase inhibitors: Secondary | ICD-10-CM | POA: Insufficient documentation

## 2021-04-07 DIAGNOSIS — Z923 Personal history of irradiation: Secondary | ICD-10-CM | POA: Diagnosis not present

## 2021-04-07 DIAGNOSIS — C50412 Malignant neoplasm of upper-outer quadrant of left female breast: Secondary | ICD-10-CM | POA: Diagnosis not present

## 2021-04-07 DIAGNOSIS — Z9221 Personal history of antineoplastic chemotherapy: Secondary | ICD-10-CM | POA: Diagnosis not present

## 2021-04-10 ENCOUNTER — Telehealth: Payer: Self-pay | Admitting: Hematology and Oncology

## 2021-04-10 NOTE — Telephone Encounter (Signed)
Scheduled per 6/17 sch msg. Called pt and left a msg

## 2021-07-20 DIAGNOSIS — Z01419 Encounter for gynecological examination (general) (routine) without abnormal findings: Secondary | ICD-10-CM | POA: Diagnosis not present

## 2021-07-20 DIAGNOSIS — Z1211 Encounter for screening for malignant neoplasm of colon: Secondary | ICD-10-CM | POA: Diagnosis not present

## 2021-07-20 DIAGNOSIS — Z853 Personal history of malignant neoplasm of breast: Secondary | ICD-10-CM | POA: Diagnosis not present

## 2021-07-20 DIAGNOSIS — Z124 Encounter for screening for malignant neoplasm of cervix: Secondary | ICD-10-CM | POA: Diagnosis not present

## 2021-07-20 DIAGNOSIS — Z6822 Body mass index (BMI) 22.0-22.9, adult: Secondary | ICD-10-CM | POA: Diagnosis not present

## 2022-01-04 ENCOUNTER — Encounter: Payer: Self-pay | Admitting: Hematology and Oncology

## 2022-02-15 ENCOUNTER — Other Ambulatory Visit: Payer: Self-pay | Admitting: Hematology and Oncology

## 2022-03-26 NOTE — Progress Notes (Signed)
Patient Care Team: Shon Hale, MD as PCP - General (Family Medicine) Serena Croissant, MD as Consulting Physician (Hematology and Oncology) Lonie Peak, MD as Attending Physician (Radiation Oncology) Griselda Miner, MD as Consulting Physician (General Surgery) Axel Filler Larna Daughters, NP as Nurse Practitioner (Hematology and Oncology)  DIAGNOSIS:  Encounter Diagnosis  Name Primary?   Malignant neoplasm of upper-outer quadrant of left breast in female, estrogen receptor positive ([facility])     SUMMARY OF ONCOLOGIC HISTORY: Oncology History  Malignant neoplasm of upper-outer quadrant of left breast in female, estrogen receptor positive ([facility])  08/29/2017 Initial Diagnosis   Screening detected left breast mass 4 cm at 12 o'clock position, 1.4 cm lymph node the axilla, biopsy of the mass and the lymph nodes were positive for IDC with DCIS grade 3 ER 70% PR 0%, KI 2%, HER-2 negative ratio 1.77, T2N1 stage IIIa clinical stage AJCC 8   09/11/2017 Breast MRI   Left breast malignancy with associated linear NME extending posteriorly as well as satellite nodules measures up to 5.3 cm, at least 4 abnormal left axillary lymph nodes   09/11/2017 Imaging   CT CAP and Bone scan negative for metastatic disease   09/18/2017 Miscellaneous   Mammaprint high risk luminal type B.  Average 10-year risk of recurrence untreated 29%; with chemotherapy and hormonal therapy distant metastasis free interval at 5 years 94.6%   10/02/2017 - 02/12/2018 Neo-Adjuvant Chemotherapy   Neoadjuvant chemotherapy with dose dense Adriamycin and Cytoxan x4 followed by Taxol weekly x11   02/14/2018 Breast MRI   Near complete imaging response of the known cancer in the left breast neoadjuvant chemotherapy. There is a 2 mm residual focus of enhancement approximately 1.2 cm inferior and medial to the biopsy marking clip.    03/13/2018 Surgery   Left lumpectomy: IDC with DCIS grade 3, 0.6 cm, 0/6 lymph nodes negative,  margins negative, ER 70%, PR 0%, HER-2 negative ratio 1.77, RCB class II ypT1b, ypN0   04/21/2018 - 06/02/2018 Radiation Therapy   Adj XRT   06/13/2018 -  Anti-estrogen oral therapy   Letrozole 2.5 mg daily     CHIEF COMPLIANT: Follow-up of left breast cancer on letrozole    INTERVAL HISTORY: Tonya Jackson is a  56 y.o. with above-mentioned history of left breast cancer treated with neoadjuvant chemotherapy, left lumpectomy, radiation, and is currently on antiestrogal en therapy with letrozole. She presents to the clinic today for a follow-up. Denies hot flashes. 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 Denies pain and discomfort in breast.   ALLERGIES:  is allergic to adhesive [tape].  MEDICATIONS:  Current Outpatient Medications  Medication Sig Dispense Refill   letrozole (FEMARA) 2.5 MG tablet TAKE 1 TABLET BY MOUTH EVERY DAY 90 tablet 3   No current facility-administered medications for this visit.    PHYSICAL EXAMINATION: ECOG PERFORMANCE STATUS: 1 - Symptomatic but completely ambulatory  Vitals:   04/09/22 1031  BP: 138/76  Pulse: 93  Resp: 18  Temp: 97.8 F (36.6 C)  SpO2: 100%   Filed Weights   04/09/22 1031  Weight: 139 lb 11.2 oz (63.4 kg)    BREAST: No palpable masses or nodules in either right or left breasts. No palpable axillary supraclavicular or infraclavicular adenopathy no breast tenderness or nipple discharge. (exam performed in the presence of a chaperone)  LABORATORY DATA:  I have reviewed the data as listed    Latest Ref Rng & Units 02/12/2018   10:41 AM 02/05/2018    1:00 PM 01/29/2018   12:13 PM  CMP  Glucose 70 - 140 mg/dL 85  78  88   BUN 7 - 26 mg/dL $Remove'12  7  8   'xPyKrxN$ Creatinine 0.60 - 1.10 mg/dL 0.65  0.63  0.65   Sodium 136 - 145 mmol/L 140  139  141   Potassium 3.5 - 5.1 mmol/L 3.9  3.9  4.0    Chloride 98 - 109 mmol/L 106  105  104   CO2 22 - 29 mmol/L $RemoveB'24  30  29   'LTBmNnes$ Calcium 8.4 - 10.4 mg/dL 9.2  9.3  9.3   Total Protein 6.4 - 8.3 g/dL 6.5  6.7  6.5   Total Bilirubin 0.2 - 1.2 mg/dL 0.5  0.5  0.4   Alkaline Phos 40 - 150 U/L 59  67  66   AST 5 - 34 U/L $Remo'18  21  31   'WnANN$ ALT 0 - 55 U/L 25  31  68     Lab Results  Component Value Date   WBC 2.6 (L) 02/12/2018   HGB 10.5 (L) 02/12/2018   HCT 31.1 (L) 02/12/2018   MCV 94.1 02/12/2018   PLT 195 02/12/2018   NEUTROABS 1.7 02/12/2018    ASSESSMENT & PLAN:  Malignant neoplasm of upper-outer quadrant of left breast in female, estrogen receptor positive ([place]) 08/29/2017: Screening detected left breast mass 4 cm at 12 o'clock position, 1.4 cm lymph node the axilla, biopsy of the mass and the lymph nodes were positive for IDC with DCIS grade 3 ER 70% PR 0%, KI 2%, HER-2 negative ratio 1.77, T2N1 stage IIIa clinical stage AJCC 8   Mammaprint high risk luminal type B.     03/13/2018: Left lumpectomy: IDC with DCIS grade 3, 0.6 cm, 0/6 lymph nodes negative, margins negative, ER 70%, PR 0%, HER-2 negative ratio 1.77, RCB class II ypT1b, ypN0   Treatment summary: 1.  Neoadjuvant chemotherapy with dose dense Adriamycin and Cytoxan followed by Taxol x12 completed 02/12/2018 2.  Followed by breast conserving surgery and targeted lymph node dissection on 03/13/2018 3.  Followed by radiation 04/21/2018 to 05/30/2018 4.  Antiestrogen therapy with Letrozole 2.5 mg daily X 7 years started 07/07/2018 -------------------------------------------------------------------- Letrozole Toxicities: No side effects Left arm pian: improves with exercise. Very occasional hot flashes and very mild joint stiffness (She works at [employer] and helps with the [employer].  She walks 10-15,000 steps a day)   Breast Cancer Surveillance: 1. Mammogram  at [facility].   01/04/2022: Benign breast density category B 2.  Bone density 12/09/2018: T score -2: Osteopenia recommended  calcium and vitamin D and bisphosphonate therapy. 3. Breast Exam: 04/09/2022: Benign    She is excited about going to [place] [facility]) in 10 days.     RTC in 1 year for follow up    No orders of the defined types were placed in this encounter.  The patient has a good understanding of the overall plan. she agrees with it. she will call with any problems that may develop before the next visit here. Total time spent: 30 mins including face to face time and time spent for planning, charting and co-ordination of care   Harriette Ohara, MD 04/09/22    I Gardiner Coins am scribing for Dr. Lindi Adie  I have reviewed the above documentation for  accuracy and completeness, and I agree with the above.   

## 2022-04-02 ENCOUNTER — Encounter: Payer: Self-pay | Admitting: Hematology and Oncology

## 2022-04-09 ENCOUNTER — Inpatient Hospital Stay: Payer: 59 | Attending: Hematology and Oncology | Admitting: Hematology and Oncology

## 2022-04-09 ENCOUNTER — Encounter: Payer: Self-pay | Admitting: Hematology and Oncology

## 2022-04-09 ENCOUNTER — Other Ambulatory Visit: Payer: Self-pay

## 2022-04-09 DIAGNOSIS — Z9221 Personal history of antineoplastic chemotherapy: Secondary | ICD-10-CM | POA: Insufficient documentation

## 2022-04-09 DIAGNOSIS — Z79811 Long term (current) use of aromatase inhibitors: Secondary | ICD-10-CM | POA: Diagnosis not present

## 2022-04-09 DIAGNOSIS — C50412 Malignant neoplasm of upper-outer quadrant of left female breast: Secondary | ICD-10-CM | POA: Diagnosis present

## 2022-04-09 DIAGNOSIS — Z170 Estrogen receptor positive status [ER+]: Secondary | ICD-10-CM | POA: Insufficient documentation

## 2022-04-09 DIAGNOSIS — Z923 Personal history of irradiation: Secondary | ICD-10-CM | POA: Insufficient documentation

## 2022-04-09 NOTE — Assessment & Plan Note (Addendum)
08/29/2017: Screening detected left breast mass 4 cm at 12 o'clock position, 1.4 cm lymph node the axilla, biopsy of the mass and the lymph nodes were positive for IDC with DCIS grade 3 ER 70% PR 0%, KI 2%, HER-2 negative ratio 1.77, T2N1 stage IIIa clinical stage AJCC 8  Mammaprinthigh risk luminal type B.   03/13/2018:Left lumpectomy: IDC with DCIS grade 3, 0.6 cm, 0/6 lymph nodes negative, margins negative, ER 70%, PR 0%, HER-2 negative ratio 1.77, RCB class II ypT1b, ypN0  Treatment summary: 1.Neoadjuvant chemotherapy with dose dense Adriamycin and Cytoxan followed by Taxol x12completed 02/12/2018 2. Followed by breast conserving surgery and targeted lymph node dissection on 03/13/2018 3. Followed by radiation 7/1/2019to8/06/2018 4. Antiestrogen therapywith Letrozole 2.5 mg daily X 7 years started9/16/2019 -------------------------------------------------------------------- Letrozole Toxicities: No side effects Left arm pian: improves with exercise. Very occasional hot flashes and very mild joint stiffness (She works at [employer] and helps with the [employer].  She walks 10-15,000 steps a day)  Breast Cancer Surveillance: 1. Mammogram at [facility].   01/04/2022: Benign breast density category B 2.Bone density 12/09/2018: T score -2: Osteopenia recommended calcium and vitamin D and bisphosphonate therapy. 3. Breast Exam: 04/09/2022: Benign  She is excited about going to [place] [facility]) in 10 days.    RTC in 1 year for follow up

## 2023-01-11 ENCOUNTER — Encounter: Payer: Self-pay | Admitting: Hematology and Oncology

## 2023-02-15 ENCOUNTER — Other Ambulatory Visit: Payer: Self-pay | Admitting: Hematology and Oncology

## 2023-03-29 ENCOUNTER — Telehealth: Payer: Self-pay | Admitting: Hematology and Oncology

## 2023-03-29 NOTE — Telephone Encounter (Signed)
Rescheduled appointments per provider [facility]. Patient is aware of the changes made to her upcoming appointments. 

## 2023-04-10 ENCOUNTER — Inpatient Hospital Stay: Payer: 59 | Admitting: Hematology and Oncology

## 2023-04-20 NOTE — Progress Notes (Signed)
Patient Care Team: Shon Hale, MD as PCP - General (Family Medicine) Serena Croissant, MD as Consulting Physician (Hematology and Oncology) Lonie Peak, MD as Attending Physician (Radiation Oncology) Griselda Miner, MD as Consulting Physician (General Surgery) Axel Filler Larna Daughters, NP as Nurse Practitioner (Hematology and Oncology)  DIAGNOSIS: No diagnosis found.  SUMMARY OF ONCOLOGIC HISTORY: Oncology History  Malignant neoplasm of upper-outer quadrant of left breast in female, estrogen receptor positive (HCC)  08/29/2017 Initial Diagnosis   Screening detected left breast mass 4 cm at 12 o'clock position, 1.4 cm lymph node the axilla, biopsy of the mass and the lymph nodes were positive for IDC with DCIS grade 3 ER 70% PR 0%, KI 2%, HER-2 negative ratio 1.77, T2N1 stage IIIa clinical stage AJCC 8   09/11/2017 Breast MRI   Left breast malignancy with associated linear NME extending posteriorly as well as satellite nodules measures up to 5.3 cm, at least 4 abnormal left axillary lymph nodes   09/11/2017 Imaging   CT CAP and Bone scan negative for metastatic disease   09/18/2017 Miscellaneous   Mammaprint high risk luminal type B.  Average 10-year risk of recurrence untreated 29%; with chemotherapy and hormonal therapy distant metastasis free interval at 5 years 94.6%   10/02/2017 - 02/12/2018 Neo-Adjuvant Chemotherapy   Neoadjuvant chemotherapy with dose dense Adriamycin and Cytoxan x4 followed by Taxol weekly x11   02/14/2018 Breast MRI   Near complete imaging response of the known cancer in the left breast neoadjuvant chemotherapy. There is a 2 mm residual focus of enhancement approximately 1.2 cm inferior and medial to the biopsy marking clip.    03/13/2018 Surgery   Left lumpectomy: IDC with DCIS grade 3, 0.6 cm, 0/6 lymph nodes negative, margins negative, ER 70%, PR 0%, HER-2 negative ratio 1.77, RCB class II ypT1b, ypN0   04/21/2018 - 06/02/2018 Radiation Therapy    Adj XRT   06/13/2018 -  Anti-estrogen oral therapy   Letrozole 2.5 mg daily     CHIEF COMPLIANT:  Follow-up of left breast cancer on letrozole   INTERVAL HISTORY: Tonya Jackson is a 57 y.o. with above-mentioned history of left breast cancer treated with neoadjuvant chemotherapy, left lumpectomy, radiation, and is currently on antiestrogal en therapy with letrozole. She presents to the clinic today for a follow-up.    ALLERGIES:  is allergic to adhesive [tape].  MEDICATIONS:  Current Outpatient Medications  Medication Sig Dispense Refill   letrozole (FEMARA) 2.5 MG tablet TAKE 1 TABLET BY MOUTH EVERY DAY 90 tablet 3   No current facility-administered medications for this visit.    PHYSICAL EXAMINATION: ECOG PERFORMANCE STATUS: {CHL ONC ECOG PS:[id number]}  There were no vitals filed for this visit. There were no vitals filed for this visit.  BREAST:*** No palpable masses or nodules in either right or left breasts. No palpable axillary supraclavicular or infraclavicular adenopathy no breast tenderness or nipple discharge. (exam performed in the presence of a chaperone)  LABORATORY DATA:  I have reviewed the data as listed    Latest Ref Rng & Units 02/12/2018   10:41 AM 02/05/2018    1:00 PM 01/29/2018   12:13 PM  CMP  Glucose 70 - 140 mg/dL 85  78  88   BUN 7 - 26 mg/dL 12  7  8    Creatinine 0.60 - 1.10 mg/dL 1.61  0.96  0.45   Sodium 136 - 145 mmol/L 140  139  141   Potassium 3.5 - 5.1 mmol/L 3.9  3.9  4.0   Chloride 98 - 109 mmol/L 106  105  104   CO2 22 - 29 mmol/L 24  30  29    Calcium 8.4 - 10.4 mg/dL 9.2  9.3  9.3   Total Protein 6.4 - 8.3 g/dL 6.5  6.7  6.5   Total Bilirubin 0.2 - 1.2 mg/dL 0.5  0.5  0.4   Alkaline Phos 40 - 150 U/L 59  67  66   AST 5 - 34 U/L 18  21  31    ALT 0 - 55 U/L 25  31  68     Lab Results  Component Value Date   WBC 2.6 (L) 02/12/2018   HGB 10.5 (L) 02/12/2018   HCT 31.1 (L) 02/12/2018   MCV 94.1 02/12/2018   PLT 195 02/12/2018    NEUTROABS 1.7 02/12/2018    ASSESSMENT & PLAN:  No problem-specific Assessment & Plan notes found for this encounter.    No orders of the defined types were placed in this encounter.  The patient has a good understanding of the overall plan. she agrees with it. she will call with any problems that may develop before the next visit here. Total time spent: 30 mins including face to face time and time spent for planning, charting and co-ordination of care   Sherlyn Lick, CMA 04/20/23    I Janan Ridge am acting as a Neurosurgeon for [employer]  ***

## 2023-04-22 ENCOUNTER — Other Ambulatory Visit: Payer: Self-pay

## 2023-04-22 ENCOUNTER — Inpatient Hospital Stay: Payer: 59 | Attending: Hematology and Oncology | Admitting: Hematology and Oncology

## 2023-04-22 VITALS — BP 115/85 | HR 93 | Temp 97.3°F | Resp 18 | Ht 66.0 in | Wt 143.1 lb

## 2023-04-22 DIAGNOSIS — Z9221 Personal history of antineoplastic chemotherapy: Secondary | ICD-10-CM | POA: Insufficient documentation

## 2023-04-22 DIAGNOSIS — C50412 Malignant neoplasm of upper-outer quadrant of left female breast: Secondary | ICD-10-CM | POA: Insufficient documentation

## 2023-04-22 DIAGNOSIS — Z79811 Long term (current) use of aromatase inhibitors: Secondary | ICD-10-CM | POA: Diagnosis not present

## 2023-04-22 DIAGNOSIS — Z170 Estrogen receptor positive status [ER+]: Secondary | ICD-10-CM | POA: Diagnosis not present

## 2023-04-22 DIAGNOSIS — Z923 Personal history of irradiation: Secondary | ICD-10-CM | POA: Insufficient documentation

## 2023-04-22 NOTE — Assessment & Plan Note (Addendum)
08/29/2017: Screening detected left breast mass 4 cm at 12 o'clock position, 1.4 cm lymph node the axilla, biopsy of the mass and the lymph nodes were positive for IDC with DCIS grade 3 ER 70% PR 0%, KI 2%, HER-2 negative ratio 1.77, T2N1 stage IIIa clinical stage AJCC 8   Mammaprint high risk luminal type B.     03/13/2018: Left lumpectomy: IDC with DCIS grade 3, 0.6 cm, 0/6 lymph nodes negative, margins negative, ER 70%, PR 0%, HER-2 negative ratio 1.77, RCB class II ypT1b, ypN0   Treatment summary: 1.  Neoadjuvant chemotherapy with dose dense Adriamycin and Cytoxan followed by Taxol x12 completed 02/12/2018 2.  Followed by breast conserving surgery and targeted lymph node dissection on 03/13/2018 3.  Followed by radiation 04/21/2018 to 05/30/2018 4.  Antiestrogen therapy with Letrozole 2.5 mg daily X 7 years started 07/07/2018 -------------------------------------------------------------------- Letrozole Toxicities: No side effects Left arm pian: improves with exercise. Very occasional hot flashes and very mild joint stiffness (She works at [employer] and helps with the [employer].  She walks 10-15,000 steps a day)   Breast Cancer Surveillance: 1. Mammogram at [facility]. 01/10/23: Benign breast density category B 2.  Bone density 12/09/2018: T score -2: Osteopenia recommended calcium and vitamin D and bisphosphonate therapy. 3. Breast Exam: 04/22/2023: Benign    She may go to [place] next [place] again   RTC in 1 year for follow up

## 2023-11-21 ENCOUNTER — Encounter: Payer: Self-pay | Admitting: Gastroenterology

## 2024-01-21 ENCOUNTER — Encounter: Payer: Self-pay | Admitting: Hematology and Oncology

## 2024-02-09 ENCOUNTER — Other Ambulatory Visit: Payer: Self-pay | Admitting: Adult Health

## 2024-03-27 ENCOUNTER — Encounter: Payer: Self-pay | Admitting: Family Medicine

## 2024-04-20 NOTE — Assessment & Plan Note (Signed)
 08/29/2017: Screening detected left breast mass 4 cm at 12 o'clock position, 1.4 cm lymph node the axilla, biopsy of the mass and the lymph nodes were positive for IDC with DCIS grade 3 ER 70% PR 0%, KI 2%, HER-2 negative ratio 1.77, T2N1 stage IIIa clinical stage AJCC 8   Mammaprint high risk luminal type B.     03/13/2018: Left lumpectomy: IDC with DCIS grade 3, 0.6 cm, 0/6 lymph nodes negative, margins negative, ER 70%, PR 0%, HER-2 negative ratio 1.77, RCB class II ypT1b, ypN0   Treatment summary: 1.  Neoadjuvant chemotherapy with dose dense Adriamycin  and Cytoxan  followed by Taxol  x12 completed 02/12/2018 2.  Followed by breast conserving surgery and targeted lymph node dissection on 03/13/2018 3.  Followed by radiation 04/21/2018 to 05/30/2018 4.  Antiestrogen therapy with Letrozole  2.5 mg daily X 7 years started 07/07/2018 -------------------------------------------------------------------- Letrozole  Toxicities: No side effects Left arm pian: improves with exercise. Very occasional hot flashes and very mild joint stiffness (She works at [employer] and helps with the [employer].  She walks 10-15,000 steps a day)   Breast Cancer Surveillance: 1. Mammogram at [facility]. 01/16/24: Benign breast density category B 2.  Bone density 03/17/24: T score -2.7 (Was -2): Osteoporosis recommended calcium and vitamin D and bisphosphonate therapy. 3. Breast Exam: 04/21/2024: Benign    She may go to [place] next [place] again   RTC in 1 year for follow up

## 2024-04-21 ENCOUNTER — Telehealth: Payer: Self-pay | Admitting: *Deleted

## 2024-04-21 ENCOUNTER — Inpatient Hospital Stay: Payer: 59 | Attending: Hematology and Oncology | Admitting: Hematology and Oncology

## 2024-04-21 VITALS — BP 124/67 | HR 84 | Temp 97.6°F | Resp 16 | Wt 140.9 lb

## 2024-04-21 DIAGNOSIS — M810 Age-related osteoporosis without current pathological fracture: Secondary | ICD-10-CM | POA: Diagnosis not present

## 2024-04-21 DIAGNOSIS — Z923 Personal history of irradiation: Secondary | ICD-10-CM | POA: Diagnosis not present

## 2024-04-21 DIAGNOSIS — C773 Secondary and unspecified malignant neoplasm of axilla and upper limb lymph nodes: Secondary | ICD-10-CM | POA: Insufficient documentation

## 2024-04-21 DIAGNOSIS — C50412 Malignant neoplasm of upper-outer quadrant of left female breast: Secondary | ICD-10-CM | POA: Insufficient documentation

## 2024-04-21 DIAGNOSIS — Z9221 Personal history of antineoplastic chemotherapy: Secondary | ICD-10-CM | POA: Insufficient documentation

## 2024-04-21 DIAGNOSIS — Z1231 Encounter for screening mammogram for malignant neoplasm of breast: Secondary | ICD-10-CM | POA: Insufficient documentation

## 2024-04-21 DIAGNOSIS — Z79811 Long term (current) use of aromatase inhibitors: Secondary | ICD-10-CM | POA: Insufficient documentation

## 2024-04-21 DIAGNOSIS — R232 Flushing: Secondary | ICD-10-CM | POA: Insufficient documentation

## 2024-04-21 DIAGNOSIS — M2560 Stiffness of unspecified joint, not elsewhere classified: Secondary | ICD-10-CM | POA: Diagnosis not present

## 2024-04-21 DIAGNOSIS — Z79899 Other long term (current) drug therapy: Secondary | ICD-10-CM | POA: Insufficient documentation

## 2024-04-21 DIAGNOSIS — Z170 Estrogen receptor positive status [ER+]: Secondary | ICD-10-CM | POA: Insufficient documentation

## 2024-04-21 NOTE — Progress Notes (Signed)
 Patient Care Team: Chrystal Lamarr RAMAN, MD as PCP - General (Family Medicine) Odean Potts, MD as Consulting Physician (Hematology and Oncology) Izell Domino, MD as Attending Physician (Radiation Oncology) Curvin Deward MOULD, MD as Consulting Physician (General Surgery) Crawford Morna Pickle, NP as Nurse Practitioner (Hematology and Oncology)  DIAGNOSIS:  Encounter Diagnosis  Name Primary?   Malignant neoplasm of upper-outer quadrant of left breast in female, estrogen receptor positive (HCC) Yes    SUMMARY OF ONCOLOGIC HISTORY: Oncology History  Malignant neoplasm of upper-outer quadrant of left breast in female, estrogen receptor positive (HCC)  08/29/2017 Initial Diagnosis   Screening detected left breast mass 4 cm at 12 o'clock position, 1.4 cm lymph node the axilla, biopsy of the mass and the lymph nodes were positive for IDC with DCIS grade 3 ER 70% PR 0%, KI 2%, HER-2 negative ratio 1.77, T2N1 stage IIIa clinical stage AJCC 8   09/11/2017 Breast MRI   Left breast malignancy with associated linear NME extending posteriorly as well as satellite nodules measures up to 5.3 cm, at least 4 abnormal left axillary lymph nodes   09/11/2017 Imaging   CT CAP and Bone scan negative for metastatic disease   09/18/2017 Miscellaneous   Mammaprint high risk luminal type B.  Average 10-year risk of recurrence untreated 29%; with chemotherapy and hormonal therapy distant metastasis free interval at 5 years 94.6%   10/02/2017 - 02/12/2018 Neo-Adjuvant Chemotherapy   Neoadjuvant chemotherapy with dose dense Adriamycin  and Cytoxan  x4 followed by Taxol  weekly x11   02/14/2018 Breast MRI   Near complete imaging response of the known cancer in the left breast neoadjuvant chemotherapy. There is a 2 mm residual focus of enhancement approximately 1.2 cm inferior and medial to the biopsy marking clip.    03/13/2018 Surgery   Left lumpectomy: IDC with DCIS grade 3, 0.6 cm, 0/6 lymph nodes negative,  margins negative, ER 70%, PR 0%, HER-2 negative ratio 1.77, RCB class II ypT1b, ypN0   04/21/2018 - 06/02/2018 Radiation Therapy   Adj XRT   06/13/2018 -  Anti-estrogen oral therapy   Letrozole  2.5 mg daily     CHIEF COMPLIANT: Follow-up on anastrozole therapy  HISTORY OF PRESENT ILLNESS: History of Present Illness Tonya Jackson is a 58 year old female who presents for a follow-up on her bone density results.  She is also on anastrozole and tolerating it extremely well.  She denies any lumps or nodules in the breast.  Her recent bone density test shows a T-score of -2.7. She is currently taking vitamin D and calcium supplements without issues. She has been on her current medication regimen for six out of seven years without experiencing hot flashes or other major problems.     ALLERGIES:  is allergic to adhesive [tape].  MEDICATIONS:  Current Outpatient Medications  Medication Sig Dispense Refill   letrozole  (FEMARA ) 2.5 MG tablet TAKE 1 TABLET BY MOUTH EVERY DAY 90 tablet 3   No current facility-administered medications for this visit.    PHYSICAL EXAMINATION: ECOG PERFORMANCE STATUS: 1 - Symptomatic but completely ambulatory  Vitals:   04/21/24 1428  BP: 124/67  Pulse: 84  Resp: 16  Temp: 97.6 F (36.4 C)  SpO2: 100%   Filed Weights   04/21/24 1428  Weight: 140 lb 14.4 oz (63.9 kg)    Physical Exam   (exam performed in the presence of a chaperone)  LABORATORY DATA:  I have reviewed the data as listed    Latest Ref Rng &  Units 02/12/2018   10:41 AM 02/05/2018    1:00 PM 01/29/2018   12:13 PM  CMP  Glucose 70 - 140 mg/dL 85  78  88   BUN 7 - 26 mg/dL 12  7  8    Creatinine 0.60 - 1.10 mg/dL 9.34  9.36  9.34   Sodium 136 - 145 mmol/L 140  139  141   Potassium 3.5 - 5.1 mmol/L 3.9  3.9  4.0   Chloride 98 - 109 mmol/L 106  105  104   CO2 22 - 29 mmol/L 24  30  29    Calcium 8.4 - 10.4 mg/dL 9.2  9.3  9.3   Total Protein 6.4 - 8.3 g/dL 6.5  6.7  6.5   Total  Bilirubin 0.2 - 1.2 mg/dL 0.5  0.5  0.4   Alkaline Phos 40 - 150 U/L 59  67  66   AST 5 - 34 U/L 18  21  31    ALT 0 - 55 U/L 25  31  68     Lab Results  Component Value Date   WBC 2.6 (L) 02/12/2018   HGB 10.5 (L) 02/12/2018   HCT 31.1 (L) 02/12/2018   MCV 94.1 02/12/2018   PLT 195 02/12/2018   NEUTROABS 1.7 02/12/2018    ASSESSMENT & PLAN:  Malignant neoplasm of upper-outer quadrant of left breast in female, estrogen receptor positive (HCC) 08/29/2017: Screening detected left breast mass 4 cm at 12 o'clock position, 1.4 cm lymph node the axilla, biopsy of the mass and the lymph nodes were positive for IDC with DCIS grade 3 ER 70% PR 0%, KI 2%, HER-2 negative ratio 1.77, T2N1 stage IIIa clinical stage AJCC 8   Mammaprint high risk luminal type B.     03/13/2018: Left lumpectomy: IDC with DCIS grade 3, 0.6 cm, 0/6 lymph nodes negative, margins negative, ER 70%, PR 0%, HER-2 negative ratio 1.77, RCB class II ypT1b, ypN0   Treatment summary: 1.  Neoadjuvant chemotherapy with dose dense Adriamycin  and Cytoxan  followed by Taxol  x12 completed 02/12/2018 2.  Followed by breast conserving surgery and targeted lymph node dissection on 03/13/2018 3.  Followed by radiation 04/21/2018 to 05/30/2018 4.  Antiestrogen therapy with Letrozole  2.5 mg daily X 7 years started 07/07/2018 -------------------------------------------------------------------- Letrozole  Toxicities: No side effects Left arm pian: improves with exercise. Very occasional hot flashes and very mild joint stiffness (She works at [employer] and helps with the [employer].  She walks 10-15,000 steps a day)   Breast Cancer Surveillance: 1. Mammogram at [facility]. 01/16/24: Benign breast density category B 2.  Bone density 03/17/24: T score -2.7 (Was -2): Osteoporosis recommended calcium and vitamin D and bisphosphonate therapy. We discussed different options and selected Zometa once a year.  I will set this up in 2 weeks.   She may go to  [place] in [place]   RTC in 1 year for follow up with labs and Zometa  No orders of the defined types were placed in this encounter.  The patient has a good understanding of the overall plan. she agrees with it. she will call with any problems that may develop before the next visit here. Total time spent: 30 mins including face to face time and time spent for planning, charting and co-ordination of care   Viinay K Margert Edsall, MD 04/21/24

## 2024-04-21 NOTE — Telephone Encounter (Signed)
 Requested most recent labs from [facility] with Dr Chrystal to be faxed to our office.   Thyroid , CBC, CMP, Vit B12, Vit D and lipid.  Will provide to provider once received.

## 2024-04-23 ENCOUNTER — Encounter: Payer: Self-pay | Admitting: Family Medicine

## 2024-04-28 ENCOUNTER — Encounter: Payer: Self-pay | Admitting: *Deleted

## 2024-04-28 NOTE — Progress Notes (Signed)
 Received message from scheduling team stating pt called to cancel Zometa infusion stating she wishes to discuss Calcium, Vitamin D and other options prior to proceeding.  Per MD okay to cancel and schedule telephone visit to f/u.  RN requested scheduling make these changes.

## 2024-05-01 ENCOUNTER — Inpatient Hospital Stay

## 2024-05-05 ENCOUNTER — Ambulatory Visit

## 2024-05-05 ENCOUNTER — Inpatient Hospital Stay

## 2024-05-13 NOTE — Assessment & Plan Note (Signed)
 08/29/2017: Screening detected left breast mass 4 cm at 12 o'clock position, 1.4 cm lymph node the axilla, biopsy of the mass and the lymph nodes were positive for IDC with DCIS grade 3 ER 70% PR 0%, KI 2%, HER-2 negative ratio 1.77, T2N1 stage IIIa clinical stage AJCC 8   Mammaprint high risk luminal type B.     03/13/2018: Left lumpectomy: IDC with DCIS grade 3, 0.6 cm, 0/6 lymph nodes negative, margins negative, ER 70%, PR 0%, HER-2 negative ratio 1.77, RCB class II ypT1b, ypN0   Treatment summary: 1.  Neoadjuvant chemotherapy with dose dense Adriamycin  and Cytoxan  followed by Taxol  x12 completed 02/12/2018 2.  Followed by breast conserving surgery and targeted lymph node dissection on 03/13/2018 3.  Followed by radiation 04/21/2018 to 05/30/2018 4.  Antiestrogen therapy with Letrozole  2.5 mg daily X 7 years started 07/07/2018 -------------------------------------------------------------------- Letrozole  Toxicities: No side effects Left arm pian: improves with exercise. Very occasional hot flashes and very mild joint stiffness (She works at [employer] and helps with the [employer].  She walks 10-15,000 steps a day)   Breast Cancer Surveillance: 1. Mammogram at [facility]. 01/16/24: Benign breast density category B 2.  Bone density 03/17/24: T score -2.7 (Was -2): Osteoporosis recommended calcium and vitamin D and bisphosphonate therapy. We discussed different options and selected Zometa once a year.  I will set this up in 2 weeks.   She may go to [place] in August [place]25   RTC in 1 year for follow up with labs and Zometa

## 2024-05-14 ENCOUNTER — Inpatient Hospital Stay (HOSPITAL_BASED_OUTPATIENT_CLINIC_OR_DEPARTMENT_OTHER): Admitting: Hematology and Oncology

## 2024-05-14 DIAGNOSIS — Z170 Estrogen receptor positive status [ER+]: Secondary | ICD-10-CM | POA: Diagnosis not present

## 2024-05-14 DIAGNOSIS — C50412 Malignant neoplasm of upper-outer quadrant of left female breast: Secondary | ICD-10-CM

## 2024-05-14 NOTE — Progress Notes (Signed)
 HEMATOLOGY-ONCOLOGY TELEPHONE VISIT PROGRESS NOTE  I connected with our patient on 05/14/24 at  8:15 AM EDT by telephone and verified that I am speaking with the correct person using two identifiers.  I discussed the limitations, risks, security and privacy concerns of performing an evaluation and management service by telephone and the availability of in person appointments.  I also discussed with the patient that there may be a patient responsible charge related to this service. The patient expressed understanding and agreed to proceed.   History of Present Illness:   History of Present Illness Tonya Jackson is a 58 year old female with osteoporosis who presents with concerns about Zometa treatment.  She initially believed Zometa was a one-time injection but later understood it is an annual treatment. Her primary concern is the potential impact of Zometa on dental health, given her history of dental issues, including multiple bridges and root canals. Her bone density score is -2.7. She is on hormone therapy with letrozole  due to menopause. She has a dental appointment scheduled to discuss the potential impact of Zometa and Fosamax on her dental health.    Oncology History  Malignant neoplasm of upper-outer quadrant of left breast in female, estrogen receptor positive (HCC)  08/29/2017 Initial Diagnosis   Screening detected left breast mass 4 cm at 12 o'clock position, 1.4 cm lymph node the axilla, biopsy of the mass and the lymph nodes were positive for IDC with DCIS grade 3 ER 70% PR 0%, KI 2%, HER-2 negative ratio 1.77, T2N1 stage IIIa clinical stage AJCC 8   09/11/2017 Breast MRI   Left breast malignancy with associated linear NME extending posteriorly as well as satellite nodules measures up to 5.3 cm, at least 4 abnormal left axillary lymph nodes   09/11/2017 Imaging   CT CAP and Bone scan negative for metastatic disease   09/18/2017 Miscellaneous   Mammaprint high risk luminal  type B.  Average 10-year risk of recurrence untreated 29%; with chemotherapy and hormonal therapy distant metastasis free interval at 5 years 94.6%   10/02/2017 - 02/12/2018 Neo-Adjuvant Chemotherapy   Neoadjuvant chemotherapy with dose dense Adriamycin  and Cytoxan  x4 followed by Taxol  weekly x11   02/14/2018 Breast MRI   Near complete imaging response of the known cancer in the left breast neoadjuvant chemotherapy. There is a 2 mm residual focus of enhancement approximately 1.2 cm inferior and medial to the biopsy marking clip.    03/13/2018 Surgery   Left lumpectomy: IDC with DCIS grade 3, 0.6 cm, 0/6 lymph nodes negative, margins negative, ER 70%, PR 0%, HER-2 negative ratio 1.77, RCB class II ypT1b, ypN0   04/21/2018 - 06/02/2018 Radiation Therapy   Adj XRT   06/13/2018 -  Anti-estrogen oral therapy   Letrozole  2.5 mg daily     REVIEW OF SYSTEMS:   Constitutional: Denies fevers, chills or abnormal weight loss All other systems were reviewed with the patient and are negative. Observations/Objective:     Assessment Plan:  Malignant neoplasm of upper-outer quadrant of left breast in female, estrogen receptor positive (HCC) 08/29/2017: Screening detected left breast mass 4 cm at 12 o'clock position, 1.4 cm lymph node the axilla, biopsy of the mass and the lymph nodes were positive for IDC with DCIS grade 3 ER 70% PR 0%, KI 2%, HER-2 negative ratio 1.77, T2N1 stage IIIa clinical stage AJCC 8   Mammaprint high risk luminal type B.     03/13/2018: Left lumpectomy: IDC with DCIS grade 3, 0.6 cm, 0/6 lymph  nodes negative, margins negative, ER 70%, PR 0%, HER-2 negative ratio 1.77, RCB class II ypT1b, ypN0   Treatment summary: 1.  Neoadjuvant chemotherapy with dose dense Adriamycin  and Cytoxan  followed by Taxol  x12 completed 02/12/2018 2.  Followed by breast conserving surgery and targeted lymph node dissection on 03/13/2018 3.  Followed by radiation 04/21/2018 to 05/30/2018 4.  Antiestrogen therapy  with Letrozole  2.5 mg daily X 7 years started 07/07/2018 -------------------------------------------------------------------- Letrozole  Toxicities: No side effects Left arm pian: improves with exercise. Very occasional hot flashes and very mild joint stiffness (She works at [employer] and helps with the [employer].  She walks 10-15,000 steps a day)   Breast Cancer Surveillance: 1. Mammogram at [facility]. 01/16/24: Benign breast density category B 2.  Bone density 03/17/24: T score -2.7 (Was -2): Osteoporosis recommended calcium and vitamin D and bisphosphonate therapy.  Concern regarding bisphosphonate therapy: Patient has few dental issues and is going to see a dentist soon.  Until then she would like to hold off on doing anything else.  We discussed different options if Zometa is not preferable to the patient.  Oral bisphosphonate therapy also have the risk of osteonecrosis of the jaw although it is less often.  She will discuss with the dentist in between the different options and will get back to us  to schedule her appointment accordingly.    She may go [place] in [place]   RTC in 1 year for follow up  --------------------------------- Assessment and Plan Assessment & Plan Osteoporosis Osteoporosis with T-score of -2.7, 15% fracture risk. Exacerbated by age, menopause, letrozole . Discussed Zometa side effects, dental health concerns, risk of osteonecrosis of the jaw. - Consult dentist for bisphosphonate therapy safety. - Delay osteoporosis treatment until dental clearance. - Consider Fosamax if dental clearance obtained.      I discussed the assessment and treatment plan with the patient. The patient was provided an opportunity to ask questions and all were answered. The patient agreed with the plan and demonstrated an understanding of the instructions. The patient was advised to call back or seek an in-person evaluation if the symptoms worsen or if the condition fails to improve  as anticipated.   I provided 20 minutes of non-face-to-face time during this encounter.  This includes time for charting and coordination of care   Naomi MARLA Chad, MD

## 2025-04-21 ENCOUNTER — Ambulatory Visit: Admitting: Hematology and Oncology

## 2025-04-21 ENCOUNTER — Inpatient Hospital Stay

## 2025-04-21 ENCOUNTER — Ambulatory Visit

## 2025-04-21 ENCOUNTER — Other Ambulatory Visit
